# Patient Record
Sex: Female | Born: 1935 | Race: White | Hispanic: No | Smoking: Former smoker
Health system: Southern US, Community
[De-identification: ages and names within clinical notes are randomized; demographics above are authoritative.]

## PROBLEM LIST (undated history)

## (undated) DIAGNOSIS — E785 Hyperlipidemia, unspecified: Secondary | ICD-10-CM

## (undated) DIAGNOSIS — I2510 Atherosclerotic heart disease of native coronary artery without angina pectoris: Secondary | ICD-10-CM

## (undated) DIAGNOSIS — I4891 Unspecified atrial fibrillation: Secondary | ICD-10-CM

## (undated) DIAGNOSIS — I739 Peripheral vascular disease, unspecified: Secondary | ICD-10-CM

## (undated) DIAGNOSIS — F411 Generalized anxiety disorder: Secondary | ICD-10-CM

## (undated) DIAGNOSIS — I10 Essential (primary) hypertension: Secondary | ICD-10-CM

## (undated) DIAGNOSIS — L988 Other specified disorders of the skin and subcutaneous tissue: Secondary | ICD-10-CM

## (undated) DIAGNOSIS — K5732 Diverticulitis of large intestine without perforation or abscess without bleeding: Secondary | ICD-10-CM

## (undated) DIAGNOSIS — I509 Heart failure, unspecified: Secondary | ICD-10-CM

## (undated) DIAGNOSIS — K219 Gastro-esophageal reflux disease without esophagitis: Secondary | ICD-10-CM

## (undated) DIAGNOSIS — G4730 Sleep apnea, unspecified: Secondary | ICD-10-CM

## (undated) DIAGNOSIS — J449 Chronic obstructive pulmonary disease, unspecified: Secondary | ICD-10-CM

## (undated) HISTORY — PX: APPENDECTOMY: SHX54

## (undated) HISTORY — DX: Sleep apnea, unspecified: G47.30

## (undated) HISTORY — DX: Generalized anxiety disorder: F41.1

## (undated) HISTORY — PX: GALLBLADDER SURGERY: SHX652

## (undated) HISTORY — PX: CHOLECYSTECTOMY: SHX55

## (undated) HISTORY — DX: Gastro-esophageal reflux disease without esophagitis: K21.9

## (undated) HISTORY — DX: Hyperlipidemia, unspecified: E78.5

## (undated) HISTORY — PX: ABDOMINAL HYSTERECTOMY: SHX81

## (undated) HISTORY — DX: Peripheral vascular disease, unspecified: I73.9

## (undated) HISTORY — PX: TUBAL LIGATION: SHX77

## (undated) HISTORY — PX: CATARACT EXTRACTION: SUR2

## (undated) HISTORY — DX: Essential (primary) hypertension: I10

## (undated) HISTORY — PX: TEMPORAL ARTERY BIOPSY / LIGATION: SUR132

## (undated) HISTORY — DX: Atherosclerotic heart disease of native coronary artery without angina pectoris: I25.10

## (undated) HISTORY — DX: Diverticulitis of large intestine without perforation or abscess without bleeding: K57.32

## (undated) HISTORY — DX: Unspecified atrial fibrillation: I48.91

## (undated) HISTORY — DX: Other specified disorders of the skin and subcutaneous tissue: L98.8

## (undated) HISTORY — DX: Chronic obstructive pulmonary disease, unspecified: J44.9

## (undated) HISTORY — PX: PARTIAL COLECTOMY: SHX5273

## (undated) HISTORY — PX: OTHER SURGICAL HISTORY: SHX169

## (undated) HISTORY — PX: TONSILLECTOMY: SUR1361

---

## 1997-12-23 ENCOUNTER — Ambulatory Visit: Admission: RE | Admit: 1997-12-23 | Discharge: 1997-12-23 | Payer: Self-pay | Admitting: Internal Medicine

## 1998-12-20 ENCOUNTER — Encounter: Payer: Self-pay | Admitting: General Surgery

## 1998-12-24 ENCOUNTER — Encounter: Payer: Self-pay | Admitting: General Surgery

## 1998-12-24 ENCOUNTER — Ambulatory Visit (HOSPITAL_COMMUNITY): Admission: RE | Admit: 1998-12-24 | Discharge: 1998-12-25 | Payer: Self-pay | Admitting: General Surgery

## 2002-04-07 ENCOUNTER — Encounter: Admission: RE | Admit: 2002-04-07 | Discharge: 2002-07-06 | Payer: Self-pay | Admitting: Internal Medicine

## 2002-12-26 ENCOUNTER — Encounter: Payer: Self-pay | Admitting: Internal Medicine

## 2003-02-06 ENCOUNTER — Encounter: Payer: Self-pay | Admitting: Internal Medicine

## 2003-12-24 ENCOUNTER — Encounter: Admission: RE | Admit: 2003-12-24 | Discharge: 2003-12-24 | Payer: Self-pay | Admitting: Internal Medicine

## 2004-03-04 ENCOUNTER — Ambulatory Visit: Payer: Self-pay | Admitting: Internal Medicine

## 2004-03-17 ENCOUNTER — Ambulatory Visit: Payer: Self-pay | Admitting: Internal Medicine

## 2004-05-12 ENCOUNTER — Ambulatory Visit: Payer: Self-pay | Admitting: Internal Medicine

## 2004-06-13 ENCOUNTER — Ambulatory Visit: Payer: Self-pay | Admitting: Internal Medicine

## 2004-06-25 ENCOUNTER — Ambulatory Visit: Payer: Self-pay | Admitting: Internal Medicine

## 2004-06-26 ENCOUNTER — Ambulatory Visit: Payer: Self-pay | Admitting: Internal Medicine

## 2004-06-27 ENCOUNTER — Ambulatory Visit: Payer: Self-pay | Admitting: Internal Medicine

## 2004-07-14 ENCOUNTER — Ambulatory Visit: Payer: Self-pay | Admitting: Internal Medicine

## 2004-07-30 ENCOUNTER — Ambulatory Visit: Payer: Self-pay | Admitting: Internal Medicine

## 2004-09-04 ENCOUNTER — Ambulatory Visit: Payer: Self-pay | Admitting: Internal Medicine

## 2004-10-01 ENCOUNTER — Ambulatory Visit: Payer: Self-pay | Admitting: Internal Medicine

## 2004-10-22 ENCOUNTER — Ambulatory Visit: Payer: Self-pay | Admitting: Internal Medicine

## 2004-10-27 ENCOUNTER — Ambulatory Visit: Payer: Self-pay

## 2004-10-28 ENCOUNTER — Ambulatory Visit (HOSPITAL_COMMUNITY): Admission: RE | Admit: 2004-10-28 | Discharge: 2004-10-28 | Payer: Self-pay | Admitting: Internal Medicine

## 2004-10-30 ENCOUNTER — Encounter: Admission: RE | Admit: 2004-10-30 | Discharge: 2004-10-30 | Payer: Self-pay | Admitting: Internal Medicine

## 2004-11-03 ENCOUNTER — Ambulatory Visit: Payer: Self-pay | Admitting: Internal Medicine

## 2004-12-26 ENCOUNTER — Ambulatory Visit: Payer: Self-pay | Admitting: Family Medicine

## 2005-01-23 ENCOUNTER — Ambulatory Visit: Payer: Self-pay | Admitting: Internal Medicine

## 2005-03-05 ENCOUNTER — Ambulatory Visit: Payer: Self-pay | Admitting: Internal Medicine

## 2005-03-17 ENCOUNTER — Ambulatory Visit: Payer: Self-pay | Admitting: Internal Medicine

## 2005-04-01 ENCOUNTER — Ambulatory Visit: Payer: Self-pay | Admitting: Internal Medicine

## 2005-04-21 ENCOUNTER — Ambulatory Visit: Payer: Self-pay | Admitting: Internal Medicine

## 2005-05-12 ENCOUNTER — Ambulatory Visit: Payer: Self-pay | Admitting: Internal Medicine

## 2005-06-11 ENCOUNTER — Ambulatory Visit: Payer: Self-pay | Admitting: Internal Medicine

## 2005-07-09 ENCOUNTER — Encounter: Admission: RE | Admit: 2005-07-09 | Discharge: 2005-07-09 | Payer: Self-pay | Admitting: Internal Medicine

## 2005-09-04 ENCOUNTER — Ambulatory Visit: Payer: Self-pay | Admitting: Internal Medicine

## 2005-09-11 ENCOUNTER — Ambulatory Visit: Payer: Self-pay | Admitting: Cardiology

## 2005-09-15 ENCOUNTER — Ambulatory Visit: Payer: Self-pay | Admitting: Internal Medicine

## 2005-09-25 ENCOUNTER — Ambulatory Visit: Payer: Self-pay | Admitting: Internal Medicine

## 2005-10-08 ENCOUNTER — Ambulatory Visit: Payer: Self-pay | Admitting: Internal Medicine

## 2005-10-09 ENCOUNTER — Ambulatory Visit: Payer: Self-pay | Admitting: Cardiology

## 2005-10-14 ENCOUNTER — Ambulatory Visit: Payer: Self-pay | Admitting: Internal Medicine

## 2005-10-20 ENCOUNTER — Ambulatory Visit: Payer: Self-pay | Admitting: Internal Medicine

## 2005-11-02 ENCOUNTER — Ambulatory Visit: Payer: Self-pay | Admitting: Internal Medicine

## 2005-11-11 ENCOUNTER — Ambulatory Visit: Payer: Self-pay | Admitting: Internal Medicine

## 2005-12-10 ENCOUNTER — Ambulatory Visit: Payer: Self-pay | Admitting: Internal Medicine

## 2006-02-09 ENCOUNTER — Ambulatory Visit: Payer: Self-pay | Admitting: Internal Medicine

## 2006-02-09 LAB — CONVERTED CEMR LAB
BUN: 7 mg/dL (ref 6–23)
CO2: 31 meq/L (ref 19–32)
Calcium: 10.5 mg/dL (ref 8.4–10.5)
Chloride: 95 meq/L — ABNORMAL LOW (ref 96–112)
Creatinine, Ser: 0.8 mg/dL (ref 0.4–1.2)
GFR calc non Af Amer: 74 mL/min
Glomerular Filtration Rate, Af Am: 90 mL/min/{1.73_m2}
Glucose, Bld: 117 mg/dL — ABNORMAL HIGH (ref 70–99)
Potassium: 3.5 meq/L (ref 3.5–5.1)
Sodium: 137 meq/L (ref 135–145)

## 2006-03-26 ENCOUNTER — Ambulatory Visit: Payer: Self-pay | Admitting: Internal Medicine

## 2006-05-10 ENCOUNTER — Ambulatory Visit: Payer: Self-pay | Admitting: Internal Medicine

## 2006-05-19 ENCOUNTER — Ambulatory Visit: Payer: Self-pay | Admitting: Internal Medicine

## 2006-06-01 ENCOUNTER — Encounter (INDEPENDENT_AMBULATORY_CARE_PROVIDER_SITE_OTHER): Payer: Self-pay | Admitting: *Deleted

## 2006-06-01 ENCOUNTER — Ambulatory Visit (HOSPITAL_COMMUNITY): Admission: RE | Admit: 2006-06-01 | Discharge: 2006-06-01 | Payer: Self-pay | Admitting: General Surgery

## 2006-06-11 ENCOUNTER — Ambulatory Visit: Payer: Self-pay | Admitting: Internal Medicine

## 2006-07-14 ENCOUNTER — Ambulatory Visit: Payer: Self-pay | Admitting: Internal Medicine

## 2006-08-05 ENCOUNTER — Encounter: Admission: RE | Admit: 2006-08-05 | Discharge: 2006-08-05 | Payer: Self-pay | Admitting: Internal Medicine

## 2006-08-25 ENCOUNTER — Ambulatory Visit: Payer: Self-pay | Admitting: Internal Medicine

## 2006-09-14 ENCOUNTER — Ambulatory Visit: Payer: Self-pay | Admitting: Internal Medicine

## 2006-10-12 ENCOUNTER — Ambulatory Visit: Payer: Self-pay | Admitting: Internal Medicine

## 2006-10-12 LAB — CONVERTED CEMR LAB
ALT: 19 units/L (ref 0–35)
AST: 22 units/L (ref 0–37)
Albumin: 4.1 g/dL (ref 3.5–5.2)
Alkaline Phosphatase: 44 units/L (ref 39–117)
BUN: 10 mg/dL (ref 6–23)
Bilirubin, Direct: 0.1 mg/dL (ref 0.0–0.3)
CO2: 29 meq/L (ref 19–32)
Calcium: 9.7 mg/dL (ref 8.4–10.5)
Chloride: 94 meq/L — ABNORMAL LOW (ref 96–112)
Cholesterol: 203 mg/dL (ref 0–200)
Creatinine, Ser: 0.8 mg/dL (ref 0.4–1.2)
Direct LDL: 128.7 mg/dL
GFR calc Af Amer: 90 mL/min
GFR calc non Af Amer: 74 mL/min
Glucose, Bld: 155 mg/dL — ABNORMAL HIGH (ref 70–99)
HDL: 39.8 mg/dL (ref >39.0)
Potassium: 4 meq/L (ref 3.5–5.1)
Sodium: 133 meq/L — ABNORMAL LOW (ref 135–145)
Total Bilirubin: 0.7 mg/dL (ref 0.3–1.2)
Total CHOL/HDL Ratio: 5.1
Total Protein: 7.1 g/dL (ref 6.0–8.3)
Triglycerides: 265 mg/dL (ref 0–149)
VLDL: 53 mg/dL — ABNORMAL HIGH (ref 0–40)

## 2006-10-29 ENCOUNTER — Telehealth (INDEPENDENT_AMBULATORY_CARE_PROVIDER_SITE_OTHER): Payer: Self-pay | Admitting: *Deleted

## 2006-11-01 ENCOUNTER — Telehealth (INDEPENDENT_AMBULATORY_CARE_PROVIDER_SITE_OTHER): Payer: Self-pay | Admitting: *Deleted

## 2006-11-01 ENCOUNTER — Telehealth: Payer: Self-pay | Admitting: Internal Medicine

## 2006-12-20 ENCOUNTER — Telehealth: Payer: Self-pay | Admitting: Internal Medicine

## 2007-01-06 DIAGNOSIS — I10 Essential (primary) hypertension: Secondary | ICD-10-CM | POA: Insufficient documentation

## 2007-01-06 DIAGNOSIS — E785 Hyperlipidemia, unspecified: Secondary | ICD-10-CM | POA: Insufficient documentation

## 2007-01-06 DIAGNOSIS — K219 Gastro-esophageal reflux disease without esophagitis: Secondary | ICD-10-CM

## 2007-01-06 DIAGNOSIS — J449 Chronic obstructive pulmonary disease, unspecified: Secondary | ICD-10-CM

## 2007-01-06 DIAGNOSIS — J4489 Other specified chronic obstructive pulmonary disease: Secondary | ICD-10-CM | POA: Insufficient documentation

## 2007-01-06 HISTORY — DX: Hyperlipidemia, unspecified: E78.5

## 2007-01-06 HISTORY — DX: Other specified chronic obstructive pulmonary disease: J44.89

## 2007-01-06 HISTORY — DX: Essential (primary) hypertension: I10

## 2007-01-06 HISTORY — DX: Chronic obstructive pulmonary disease, unspecified: J44.9

## 2007-01-06 HISTORY — DX: Gastro-esophageal reflux disease without esophagitis: K21.9

## 2007-01-12 ENCOUNTER — Ambulatory Visit: Payer: Self-pay | Admitting: Internal Medicine

## 2007-01-12 DIAGNOSIS — M353 Polymyalgia rheumatica: Secondary | ICD-10-CM | POA: Insufficient documentation

## 2007-01-12 LAB — CONVERTED CEMR LAB
Cholesterol, target level: 200 mg/dL
HDL goal, serum: 40 mg/dL
LDL Goal: 100 mg/dL

## 2007-01-13 LAB — CONVERTED CEMR LAB
ALT: 18 units/L (ref 0–35)
AST: 22 units/L (ref 0–37)
Albumin: 3.9 g/dL (ref 3.5–5.2)
Alkaline Phosphatase: 49 units/L (ref 39–117)
BUN: 6 mg/dL (ref 6–23)
Bilirubin, Direct: 0.1 mg/dL (ref 0.0–0.3)
CO2: 29 meq/L (ref 19–32)
Calcium: 9.5 mg/dL (ref 8.4–10.5)
Chloride: 99 meq/L (ref 96–112)
Cholesterol: 209 mg/dL (ref 0–200)
Creatinine, Ser: 0.8 mg/dL (ref 0.4–1.2)
Direct LDL: 128.3 mg/dL
GFR calc Af Amer: 90 mL/min
GFR calc non Af Amer: 74 mL/min
Glucose, Bld: 201 mg/dL — ABNORMAL HIGH (ref 70–99)
HDL: 24.9 mg/dL — ABNORMAL LOW (ref >39.0)
Hgb A1c MFr Bld: 6.8 % — ABNORMAL HIGH (ref 4.6–6.0)
Potassium: 3.7 meq/L (ref 3.5–5.1)
Sodium: 137 meq/L (ref 135–145)
Total Bilirubin: 0.6 mg/dL (ref 0.3–1.2)
Total CHOL/HDL Ratio: 8.4
Total Protein: 6.6 g/dL (ref 6.0–8.3)
Triglycerides: 348 mg/dL (ref 0–149)
VLDL: 70 mg/dL — ABNORMAL HIGH (ref 0–40)

## 2007-01-17 ENCOUNTER — Ambulatory Visit: Payer: Self-pay | Admitting: Internal Medicine

## 2007-01-17 DIAGNOSIS — E119 Type 2 diabetes mellitus without complications: Secondary | ICD-10-CM

## 2007-01-26 ENCOUNTER — Telehealth: Payer: Self-pay | Admitting: Internal Medicine

## 2007-02-08 ENCOUNTER — Telehealth: Payer: Self-pay | Admitting: Internal Medicine

## 2007-03-04 ENCOUNTER — Telehealth: Payer: Self-pay | Admitting: Internal Medicine

## 2007-03-10 ENCOUNTER — Ambulatory Visit: Payer: Self-pay | Admitting: Internal Medicine

## 2007-03-10 LAB — CONVERTED CEMR LAB
BUN: 8 mg/dL (ref 6–23)
CO2: 30 meq/L (ref 19–32)
Calcium: 9.9 mg/dL (ref 8.4–10.5)
Chloride: 95 meq/L — ABNORMAL LOW (ref 96–112)
Creatinine, Ser: 0.7 mg/dL (ref 0.4–1.2)
GFR calc Af Amer: 105 mL/min
GFR calc non Af Amer: 86 mL/min
Glucose, Bld: 89 mg/dL (ref 70–99)
Potassium: 4 meq/L (ref 3.5–5.1)
Sodium: 135 meq/L (ref 135–145)

## 2007-04-25 ENCOUNTER — Telehealth (INDEPENDENT_AMBULATORY_CARE_PROVIDER_SITE_OTHER): Payer: Self-pay | Admitting: *Deleted

## 2007-05-02 ENCOUNTER — Telehealth: Payer: Self-pay | Admitting: Internal Medicine

## 2007-06-08 ENCOUNTER — Ambulatory Visit: Payer: Self-pay | Admitting: Internal Medicine

## 2007-06-09 LAB — CONVERTED CEMR LAB
BUN: 7 mg/dL (ref 6–23)
CO2: 28 meq/L (ref 19–32)
Calcium: 10.2 mg/dL (ref 8.4–10.5)
Chloride: 95 meq/L — ABNORMAL LOW (ref 96–112)
Cholesterol: 203 mg/dL (ref 0–200)
Creatinine, Ser: 1 mg/dL (ref 0.4–1.2)
Direct LDL: 134.3 mg/dL
GFR calc Af Amer: 69 mL/min
GFR calc non Af Amer: 57 mL/min
Glucose, Bld: 111 mg/dL — ABNORMAL HIGH (ref 70–99)
HDL: 28 mg/dL — ABNORMAL LOW (ref >39.0)
Hgb A1c MFr Bld: 6.6 % — ABNORMAL HIGH (ref 4.6–6.0)
Potassium: 4 meq/L (ref 3.5–5.1)
Sodium: 134 meq/L — ABNORMAL LOW (ref 135–145)
Total CHOL/HDL Ratio: 7.3
Triglycerides: 232 mg/dL (ref 0–149)
VLDL: 46 mg/dL — ABNORMAL HIGH (ref 0–40)

## 2007-06-14 ENCOUNTER — Emergency Department (HOSPITAL_COMMUNITY): Admission: EM | Admit: 2007-06-14 | Discharge: 2007-06-15 | Payer: Self-pay | Admitting: Emergency Medicine

## 2007-06-14 ENCOUNTER — Telehealth: Payer: Self-pay | Admitting: Internal Medicine

## 2007-06-27 ENCOUNTER — Ambulatory Visit: Payer: Self-pay | Admitting: Internal Medicine

## 2007-06-28 ENCOUNTER — Telehealth: Payer: Self-pay | Admitting: Internal Medicine

## 2007-06-28 LAB — CONVERTED CEMR LAB
ALT: 16 units/L (ref 0–35)
Bilirubin, Direct: 0.1 mg/dL (ref 0.0–0.3)
Total Protein: 6.8 g/dL (ref 6.0–8.3)

## 2007-06-29 ENCOUNTER — Ambulatory Visit: Payer: Self-pay | Admitting: Cardiology

## 2007-06-30 ENCOUNTER — Encounter: Payer: Self-pay | Admitting: Internal Medicine

## 2007-06-30 ENCOUNTER — Ambulatory Visit: Payer: Self-pay

## 2007-07-07 ENCOUNTER — Telehealth: Payer: Self-pay | Admitting: Internal Medicine

## 2007-09-08 ENCOUNTER — Encounter: Admission: RE | Admit: 2007-09-08 | Discharge: 2007-09-08 | Payer: Self-pay | Admitting: Internal Medicine

## 2007-09-22 ENCOUNTER — Telehealth: Payer: Self-pay | Admitting: Internal Medicine

## 2007-09-23 ENCOUNTER — Telehealth: Payer: Self-pay | Admitting: Internal Medicine

## 2007-09-23 ENCOUNTER — Encounter: Payer: Self-pay | Admitting: Internal Medicine

## 2007-09-23 ENCOUNTER — Ambulatory Visit: Payer: Self-pay | Admitting: Internal Medicine

## 2007-09-30 ENCOUNTER — Ambulatory Visit: Payer: Self-pay | Admitting: Internal Medicine

## 2007-09-30 LAB — CONVERTED CEMR LAB
BUN: 11 mg/dL (ref 6–23)
Calcium: 9.8 mg/dL (ref 8.4–10.5)
Chloride: 96 meq/L (ref 96–112)
Cholesterol: 175 mg/dL (ref 0–200)
Direct LDL: 91.2 mg/dL
GFR calc non Af Amer: 74 mL/min
Hgb A1c MFr Bld: 6.4 % — ABNORMAL HIGH (ref 4.6–6.0)
Potassium: 4.3 meq/L (ref 3.5–5.1)
Sodium: 133 meq/L — ABNORMAL LOW (ref 135–145)

## 2007-10-17 ENCOUNTER — Telehealth: Payer: Self-pay | Admitting: Internal Medicine

## 2007-12-20 ENCOUNTER — Telehealth: Payer: Self-pay | Admitting: Internal Medicine

## 2008-01-02 ENCOUNTER — Telehealth: Payer: Self-pay | Admitting: Internal Medicine

## 2008-01-04 DIAGNOSIS — I739 Peripheral vascular disease, unspecified: Secondary | ICD-10-CM

## 2008-01-04 DIAGNOSIS — K449 Diaphragmatic hernia without obstruction or gangrene: Secondary | ICD-10-CM | POA: Insufficient documentation

## 2008-01-04 DIAGNOSIS — I2510 Atherosclerotic heart disease of native coronary artery without angina pectoris: Secondary | ICD-10-CM

## 2008-01-04 DIAGNOSIS — Z86718 Personal history of other venous thrombosis and embolism: Secondary | ICD-10-CM

## 2008-01-05 ENCOUNTER — Ambulatory Visit: Payer: Self-pay | Admitting: Internal Medicine

## 2008-01-12 ENCOUNTER — Ambulatory Visit: Payer: Self-pay | Admitting: Internal Medicine

## 2008-01-13 ENCOUNTER — Encounter: Payer: Self-pay | Admitting: Internal Medicine

## 2008-01-13 ENCOUNTER — Ambulatory Visit: Payer: Self-pay | Admitting: Internal Medicine

## 2008-01-16 ENCOUNTER — Encounter: Payer: Self-pay | Admitting: Internal Medicine

## 2008-01-16 LAB — CONVERTED CEMR LAB: UREASE: NEGATIVE

## 2008-01-23 ENCOUNTER — Telehealth: Payer: Self-pay | Admitting: *Deleted

## 2008-01-26 ENCOUNTER — Ambulatory Visit: Payer: Self-pay | Admitting: Internal Medicine

## 2008-02-03 ENCOUNTER — Telehealth: Payer: Self-pay | Admitting: Internal Medicine

## 2008-02-05 LAB — HM COLONOSCOPY

## 2008-02-06 LAB — CONVERTED CEMR LAB
ALT: 14 units/L (ref 0–35)
AST: 24 units/L (ref 0–37)
CO2: 29 meq/L (ref 19–32)
Cholesterol: 188 mg/dL (ref 0–200)
Direct LDL: 95.5 mg/dL
GFR calc Af Amer: 69 mL/min
Hgb A1c MFr Bld: 6.1 % — ABNORMAL HIGH (ref 4.6–6.0)
Sodium: 135 meq/L (ref 135–145)
Total CHOL/HDL Ratio: 7
Triglycerides: 336 mg/dL (ref 0–149)

## 2008-02-22 ENCOUNTER — Inpatient Hospital Stay: Payer: Self-pay | Admitting: Specialist

## 2008-03-09 ENCOUNTER — Ambulatory Visit: Payer: Self-pay | Admitting: Internal Medicine

## 2008-03-13 ENCOUNTER — Emergency Department: Payer: Self-pay | Admitting: Emergency Medicine

## 2008-03-16 ENCOUNTER — Ambulatory Visit: Payer: Self-pay | Admitting: Internal Medicine

## 2008-03-19 ENCOUNTER — Ambulatory Visit: Payer: Self-pay | Admitting: Internal Medicine

## 2008-04-09 ENCOUNTER — Telehealth: Payer: Self-pay | Admitting: Internal Medicine

## 2008-05-18 ENCOUNTER — Ambulatory Visit: Payer: Self-pay | Admitting: Internal Medicine

## 2008-05-18 LAB — CONVERTED CEMR LAB
Alkaline Phosphatase: 57 units/L (ref 39–117)
Bilirubin, Direct: 0.1 mg/dL (ref 0.0–0.3)
Cholesterol: 193 mg/dL (ref 0–200)
Direct LDL: 111.9 mg/dL
HDL: 30.2 mg/dL — ABNORMAL LOW (ref >39.0)
Hgb A1c MFr Bld: 6.3 % — ABNORMAL HIGH (ref 4.6–6.0)
Potassium: 4.8 meq/L (ref 3.5–5.1)
Total Bilirubin: 0.5 mg/dL (ref 0.3–1.2)
Triglycerides: 258 mg/dL (ref 0–149)
VLDL: 52 mg/dL — ABNORMAL HIGH (ref 0–40)

## 2008-05-23 ENCOUNTER — Ambulatory Visit: Payer: Self-pay | Admitting: Internal Medicine

## 2008-05-30 ENCOUNTER — Ambulatory Visit: Payer: Self-pay | Admitting: Internal Medicine

## 2008-06-01 ENCOUNTER — Telehealth: Payer: Self-pay | Admitting: Internal Medicine

## 2008-07-11 ENCOUNTER — Ambulatory Visit: Payer: Self-pay | Admitting: Internal Medicine

## 2008-08-30 ENCOUNTER — Telehealth: Payer: Self-pay | Admitting: Internal Medicine

## 2008-10-02 ENCOUNTER — Ambulatory Visit: Payer: Self-pay | Admitting: Internal Medicine

## 2008-10-03 ENCOUNTER — Telehealth: Payer: Self-pay | Admitting: Internal Medicine

## 2008-11-02 ENCOUNTER — Ambulatory Visit: Payer: Self-pay | Admitting: Internal Medicine

## 2008-11-13 ENCOUNTER — Telehealth: Payer: Self-pay | Admitting: Internal Medicine

## 2008-11-21 ENCOUNTER — Ambulatory Visit: Payer: Self-pay | Admitting: Internal Medicine

## 2008-11-21 DIAGNOSIS — F411 Generalized anxiety disorder: Secondary | ICD-10-CM | POA: Insufficient documentation

## 2008-11-21 DIAGNOSIS — G4730 Sleep apnea, unspecified: Secondary | ICD-10-CM | POA: Insufficient documentation

## 2008-11-21 HISTORY — DX: Generalized anxiety disorder: F41.1

## 2008-11-21 HISTORY — DX: Sleep apnea, unspecified: G47.30

## 2008-12-05 ENCOUNTER — Encounter: Payer: Self-pay | Admitting: Internal Medicine

## 2008-12-13 ENCOUNTER — Encounter: Payer: Self-pay | Admitting: Internal Medicine

## 2008-12-28 ENCOUNTER — Ambulatory Visit: Payer: Self-pay | Admitting: Internal Medicine

## 2008-12-31 ENCOUNTER — Telehealth: Payer: Self-pay | Admitting: Internal Medicine

## 2009-01-02 ENCOUNTER — Encounter: Payer: Self-pay | Admitting: Internal Medicine

## 2009-01-02 ENCOUNTER — Ambulatory Visit (HOSPITAL_BASED_OUTPATIENT_CLINIC_OR_DEPARTMENT_OTHER): Admission: RE | Admit: 2009-01-02 | Discharge: 2009-01-02 | Payer: Self-pay | Admitting: Internal Medicine

## 2009-01-03 LAB — CONVERTED CEMR LAB
ALT: 14 units/L (ref 0–35)
AST: 20 units/L (ref 0–37)
Albumin: 4.2 g/dL (ref 3.5–5.2)
BUN: 11 mg/dL (ref 6–23)
Calcium: 9.1 mg/dL (ref 8.4–10.5)
GFR calc non Af Amer: 64.37 mL/min (ref >60)
Glucose, Bld: 125 mg/dL — ABNORMAL HIGH (ref 70–99)
Potassium: 3.5 meq/L (ref 3.5–5.1)
Sodium: 130 meq/L — ABNORMAL LOW (ref 135–145)
Total Protein: 7.1 g/dL (ref 6.0–8.3)
Triglycerides: 259 mg/dL — ABNORMAL HIGH (ref 0.0–149.0)
VLDL: 51.8 mg/dL — ABNORMAL HIGH (ref 0.0–40.0)

## 2009-01-04 ENCOUNTER — Encounter: Payer: Self-pay | Admitting: Internal Medicine

## 2009-01-04 ENCOUNTER — Telehealth: Payer: Self-pay | Admitting: *Deleted

## 2009-01-08 ENCOUNTER — Ambulatory Visit: Payer: Self-pay | Admitting: Pulmonary Disease

## 2009-01-21 ENCOUNTER — Encounter: Payer: Self-pay | Admitting: Internal Medicine

## 2009-01-22 ENCOUNTER — Telehealth: Payer: Self-pay | Admitting: Internal Medicine

## 2009-02-04 ENCOUNTER — Encounter: Payer: Self-pay | Admitting: Internal Medicine

## 2009-03-30 ENCOUNTER — Inpatient Hospital Stay (HOSPITAL_COMMUNITY): Admission: EM | Admit: 2009-03-30 | Discharge: 2009-04-04 | Payer: Self-pay | Admitting: Emergency Medicine

## 2009-03-30 ENCOUNTER — Ambulatory Visit: Payer: Self-pay | Admitting: Cardiovascular Disease

## 2009-04-02 ENCOUNTER — Encounter (INDEPENDENT_AMBULATORY_CARE_PROVIDER_SITE_OTHER): Payer: Self-pay | Admitting: Internal Medicine

## 2009-04-10 ENCOUNTER — Telehealth: Payer: Self-pay | Admitting: Family Medicine

## 2009-04-10 ENCOUNTER — Ambulatory Visit: Payer: Self-pay | Admitting: Family Medicine

## 2009-04-10 LAB — CONVERTED CEMR LAB
Bilirubin Urine: NEGATIVE
Nitrite: NEGATIVE
Specific Gravity, Urine: 1.01
Urobilinogen, UA: 0.2

## 2009-04-11 ENCOUNTER — Encounter: Payer: Self-pay | Admitting: Internal Medicine

## 2009-04-15 ENCOUNTER — Telehealth: Payer: Self-pay | Admitting: Internal Medicine

## 2009-04-19 ENCOUNTER — Ambulatory Visit: Payer: Self-pay | Admitting: Internal Medicine

## 2009-04-19 LAB — CONVERTED CEMR LAB
Basophils Absolute: 0.1 10*3/uL (ref 0.0–0.1)
HCT: 31.8 % — ABNORMAL LOW (ref 36.0–46.0)
Hemoglobin: 10.3 g/dL — ABNORMAL LOW (ref 12.0–15.0)
Lymphocytes Relative: 29.9 % (ref 12.0–46.0)
Lymphs Abs: 1.5 10*3/uL (ref 0.7–4.0)
MCHC: 32.5 g/dL (ref 30.0–36.0)
Monocytes Absolute: 0.5 10*3/uL (ref 0.1–1.0)
Monocytes Relative: 9 % (ref 3.0–12.0)
RDW: 12.8 % (ref 11.5–14.6)
WBC: 5.1 10*3/uL (ref 4.5–10.5)

## 2009-04-22 ENCOUNTER — Telehealth: Payer: Self-pay | Admitting: Internal Medicine

## 2009-04-22 LAB — CONVERTED CEMR LAB
AST: 17 units/L (ref 0–37)
Alkaline Phosphatase: 38 units/L — ABNORMAL LOW (ref 39–117)
Bilirubin, Direct: 0.1 mg/dL (ref 0.0–0.3)
CO2: 19 meq/L (ref 19–32)
Chloride: 92 meq/L — ABNORMAL LOW (ref 96–112)
Glucose, Bld: 117 mg/dL — ABNORMAL HIGH (ref 70–99)
Indirect Bilirubin: 0.2 mg/dL (ref 0.0–0.9)
Potassium: 5.7 meq/L — ABNORMAL HIGH (ref 3.5–5.3)
Total Bilirubin: 0.3 mg/dL (ref 0.3–1.2)

## 2009-04-23 ENCOUNTER — Ambulatory Visit: Payer: Self-pay | Admitting: Gastroenterology

## 2009-04-23 ENCOUNTER — Ambulatory Visit: Payer: Self-pay | Admitting: Internal Medicine

## 2009-04-23 LAB — CONVERTED CEMR LAB
Basophils Absolute: 0.1 10*3/uL (ref 0.0–0.1)
Basophils Relative: 1.3 % (ref 0.0–3.0)
Bilirubin Urine: NEGATIVE
Eosinophils Relative: 5.1 % — ABNORMAL HIGH (ref 0.0–5.0)
MCV: 87 fL (ref 78.0–100.0)
Monocytes Absolute: 0.7 10*3/uL (ref 0.1–1.0)
Monocytes Relative: 10.6 % (ref 3.0–12.0)
Neutro Abs: 2.9 10*3/uL (ref 1.4–7.7)
Neutrophils Relative %: 45.2 % (ref 43.0–77.0)
Platelets: 265 10*3/uL (ref 150.0–400.0)
RDW: 12.8 % (ref 11.5–14.6)
Specific Gravity, Urine: 1.025 (ref 1.000–1.030)
Urine Glucose: NEGATIVE mg/dL
Urobilinogen, UA: 0.2 (ref 0.0–1.0)
WBC: 6.4 10*3/uL (ref 4.5–10.5)
pH: 5 (ref 5.0–8.0)

## 2009-04-24 ENCOUNTER — Ambulatory Visit: Payer: Self-pay | Admitting: Cardiovascular Disease

## 2009-04-24 LAB — CONVERTED CEMR LAB
CO2: 24 meq/L (ref 19–32)
Chloride: 96 meq/L (ref 96–112)
Potassium: 5.5 meq/L — ABNORMAL HIGH (ref 3.5–5.1)
Sodium: 125 meq/L — ABNORMAL LOW (ref 135–145)

## 2009-04-26 ENCOUNTER — Ambulatory Visit: Payer: Self-pay | Admitting: Internal Medicine

## 2009-04-29 LAB — CONVERTED CEMR LAB
BUN: 10 mg/dL
CO2: 22 meq/L
Calcium: 9.6 mg/dL
Chloride: 95 meq/L — ABNORMAL LOW
Creatinine, Ser: 0.85 mg/dL
Glucose, Bld: 104 mg/dL — ABNORMAL HIGH
Potassium: 5.8 meq/L — ABNORMAL HIGH
Sodium: 129 meq/L — ABNORMAL LOW

## 2009-04-30 ENCOUNTER — Encounter: Payer: Self-pay | Admitting: Internal Medicine

## 2009-04-30 ENCOUNTER — Telehealth: Payer: Self-pay | Admitting: Internal Medicine

## 2009-05-01 ENCOUNTER — Ambulatory Visit: Payer: Self-pay | Admitting: Internal Medicine

## 2009-05-02 ENCOUNTER — Encounter: Payer: Self-pay | Admitting: Gastroenterology

## 2009-05-02 ENCOUNTER — Encounter: Payer: Self-pay | Admitting: Internal Medicine

## 2009-05-02 LAB — CONVERTED CEMR LAB
BUN: 4 mg/dL — ABNORMAL LOW (ref 6–23)
Chloride: 98 meq/L (ref 96–112)
Creatinine, Ser: 0.8 mg/dL (ref 0.4–1.2)
GFR calc non Af Amer: 73.67 mL/min (ref >60)

## 2009-05-03 ENCOUNTER — Telehealth: Payer: Self-pay | Admitting: Internal Medicine

## 2009-05-06 ENCOUNTER — Encounter: Admission: RE | Admit: 2009-05-06 | Discharge: 2009-05-06 | Payer: Self-pay | Admitting: General Surgery

## 2009-05-07 ENCOUNTER — Ambulatory Visit: Payer: Self-pay | Admitting: Internal Medicine

## 2009-05-08 ENCOUNTER — Telehealth (INDEPENDENT_AMBULATORY_CARE_PROVIDER_SITE_OTHER): Payer: Self-pay

## 2009-05-09 ENCOUNTER — Encounter (HOSPITAL_COMMUNITY): Admission: RE | Admit: 2009-05-09 | Discharge: 2009-07-11 | Payer: Self-pay | Admitting: Internal Medicine

## 2009-05-09 ENCOUNTER — Ambulatory Visit: Payer: Self-pay | Admitting: Internal Medicine

## 2009-05-09 ENCOUNTER — Ambulatory Visit: Payer: Self-pay

## 2009-05-10 ENCOUNTER — Telehealth (INDEPENDENT_AMBULATORY_CARE_PROVIDER_SITE_OTHER): Payer: Self-pay | Admitting: *Deleted

## 2009-05-14 ENCOUNTER — Encounter (INDEPENDENT_AMBULATORY_CARE_PROVIDER_SITE_OTHER): Payer: Self-pay | Admitting: General Surgery

## 2009-05-14 ENCOUNTER — Inpatient Hospital Stay (HOSPITAL_COMMUNITY): Admission: RE | Admit: 2009-05-14 | Discharge: 2009-05-20 | Payer: Self-pay | Admitting: General Surgery

## 2009-05-21 ENCOUNTER — Telehealth: Payer: Self-pay | Admitting: Internal Medicine

## 2009-05-30 ENCOUNTER — Encounter: Payer: Self-pay | Admitting: Internal Medicine

## 2009-05-31 ENCOUNTER — Telehealth: Payer: Self-pay | Admitting: Internal Medicine

## 2009-06-03 ENCOUNTER — Encounter: Payer: Self-pay | Admitting: Internal Medicine

## 2009-06-04 ENCOUNTER — Encounter: Payer: Self-pay | Admitting: Internal Medicine

## 2009-06-12 ENCOUNTER — Encounter: Payer: Self-pay | Admitting: Internal Medicine

## 2009-06-14 ENCOUNTER — Encounter: Payer: Self-pay | Admitting: Internal Medicine

## 2009-06-21 ENCOUNTER — Telehealth: Payer: Self-pay | Admitting: Internal Medicine

## 2009-07-17 ENCOUNTER — Ambulatory Visit: Payer: Self-pay | Admitting: Family Medicine

## 2009-07-17 DIAGNOSIS — H8110 Benign paroxysmal vertigo, unspecified ear: Secondary | ICD-10-CM

## 2009-07-23 ENCOUNTER — Ambulatory Visit: Payer: Self-pay | Admitting: Internal Medicine

## 2009-07-23 DIAGNOSIS — K5732 Diverticulitis of large intestine without perforation or abscess without bleeding: Secondary | ICD-10-CM

## 2009-07-23 DIAGNOSIS — F17200 Nicotine dependence, unspecified, uncomplicated: Secondary | ICD-10-CM

## 2009-07-23 HISTORY — DX: Diverticulitis of large intestine without perforation or abscess without bleeding: K57.32

## 2009-07-25 ENCOUNTER — Encounter: Payer: Self-pay | Admitting: Internal Medicine

## 2009-09-20 ENCOUNTER — Telehealth: Payer: Self-pay | Admitting: Internal Medicine

## 2010-01-04 LAB — HM DIABETES EYE EXAM: HM Diabetic Eye Exam: NORMAL

## 2010-01-09 ENCOUNTER — Ambulatory Visit: Payer: Self-pay | Admitting: Internal Medicine

## 2010-01-09 DIAGNOSIS — R269 Unspecified abnormalities of gait and mobility: Secondary | ICD-10-CM

## 2010-01-13 LAB — CONVERTED CEMR LAB
ALT: 14 units/L (ref 0–35)
AST: 18 units/L (ref 0–37)
Albumin: 4 g/dL (ref 3.5–5.2)
Basophils Absolute: 0.1 10*3/uL (ref 0.0–0.1)
Calcium: 9.6 mg/dL (ref 8.4–10.5)
Cholesterol: 189 mg/dL (ref 0–200)
Creatinine, Ser: 0.8 mg/dL (ref 0.4–1.2)
Direct LDL: 98.9 mg/dL
GFR calc non Af Amer: 71.47 mL/min (ref >60)
Glucose, Bld: 112 mg/dL — ABNORMAL HIGH (ref 70–99)
HDL: 31.9 mg/dL — ABNORMAL LOW (ref >39.00)
Hemoglobin: 13.8 g/dL (ref 12.0–15.0)
Hgb A1c MFr Bld: 6.2 % (ref 4.6–6.5)
Lymphocytes Relative: 31.3 % (ref 12.0–46.0)
Monocytes Relative: 8.6 % (ref 3.0–12.0)
Neutro Abs: 4.4 10*3/uL (ref 1.4–7.7)
Neutrophils Relative %: 53.7 % (ref 43.0–77.0)
RDW: 13.1 % (ref 11.5–14.6)
Sodium: 139 meq/L (ref 135–145)
Total Bilirubin: 0.4 mg/dL (ref 0.3–1.2)
Total Protein: 7 g/dL (ref 6.0–8.3)
Triglycerides: 349 mg/dL — ABNORMAL HIGH (ref 0.0–149.0)
VLDL: 69.8 mg/dL — ABNORMAL HIGH (ref 0.0–40.0)

## 2010-01-20 ENCOUNTER — Encounter: Admission: RE | Admit: 2010-01-20 | Discharge: 2010-01-20 | Payer: Self-pay | Admitting: Internal Medicine

## 2010-01-20 LAB — HM MAMMOGRAPHY

## 2010-01-24 ENCOUNTER — Telehealth: Payer: Self-pay | Admitting: Internal Medicine

## 2010-01-29 ENCOUNTER — Encounter: Payer: Self-pay | Admitting: Internal Medicine

## 2010-02-04 ENCOUNTER — Encounter: Payer: Self-pay | Admitting: Internal Medicine

## 2010-02-13 ENCOUNTER — Telehealth: Payer: Self-pay | Admitting: Internal Medicine

## 2010-02-20 ENCOUNTER — Encounter: Payer: Self-pay | Admitting: Internal Medicine

## 2010-03-04 ENCOUNTER — Telehealth: Payer: Self-pay | Admitting: Internal Medicine

## 2010-03-06 ENCOUNTER — Encounter: Payer: Self-pay | Admitting: Internal Medicine

## 2010-05-06 NOTE — Progress Notes (Signed)
Summary: Nuc. Pre-Procedure  Phone Note Outgoing Call Call back at [facility] Phone [phone]   Call placed by: Irean Hong, RN,  May 08, 2009 2:00 PM Summary of Call: Reviewed information on [employer] Information Sheet (see scanned document for further details).  Spoke with patient's daughter.     Nuclear Med Background Indications for Stress Test: Evaluation for Ischemia, Surgical Clearance  Indications Comments: Pending: Recto-vaginal fistula with general anesthesia by Dr. Claud Kelp  History: COPD, CT/MRI, Echo  History Comments: 12/10 Echo:EF=55-65%.     Nuclear Pre-Procedure Cardiac Risk Factors: Hypertension, Lipids, NIDDM, PVD, Smoker Height (in): 64

## 2010-05-06 NOTE — Assessment & Plan Note (Signed)
Summary: 4 MONTH ROV/NJR/pt rescd from bump//ccm/pt rsc from bmp/cjr   Vital Signs:  Patient profile:   77 year old female Weight:      151 pounds Temp:     98.3 degrees F BP sitting:   124 / 60  Vitals Entered By: Gladis Riffle, RN (April 19, 2009 2:38 PM)   Primary Care Provider:  Birdie Sons MD   History of Present Illness: recent diverticulitis---see echart---CT pelvic  pt. here with dtr and son she is feeling some better and denies abdominal pain, fever, chills. she has noted some feculent matter from vafinal introitus. no uti signs she denies back pain there is some question as to whether she is to havensuregery review of hospital records...needs to followup with gi and surgery she admits to weakness...improving since hospitalization  All other systems reviewed and were negative     Preventive Screening-Counseling & Management  Alcohol-Tobacco     Smoking Status: current     Packs/Day: 0.5  Current Medications (verified): 1)  Alprazolam 0.5 Mg Tabs (Alprazolam) .... Take 1 Tablet By Mouth Once A Day-- Each Rx To Last 30 Days 2)  Aspir-81 81 Mg Tbec (Aspirin) .... Take 1 Tablet By Mouth Once A Day 3)  Zegerid 40-1100 Mg Caps (Omeprazole-Sodium Bicarbonate) .... Take 1 Capsule By Mouth Once A Day 4)  Hydrocodone-Acetaminophen 7.5-650 Mg Tabs (Hydrocodone-Acetaminophen) .... Take 1 Tablet By Mouth Three Times A Day As Needed Pain 5)  Prednisone 5 Mg Tabs (Prednisone) .... Take 3 By Mouth Once Daily or As Directed 6)  Voltaren 1 % Gel (Diclofenac Sodium) .... Apply Two Times A Day To Shoulder As Needed Pain 7)  Fluticasone Propionate 50 Mcg/act  Susp (Fluticasone Propionate) .... 2 Sprays Each Nostril Once Daily 8)  Ferrous Sulfate 325 (65 Fe) Mg Tabs (Ferrous Sulfate) .... Take 1 Tablet By Mouth Two Times A Day 9)  Ciprofloxacin Hcl 500 Mg Tabs (Ciprofloxacin Hcl) .Marland Kitchen.. 1 By Mouth Two Times A Day For 13 Days 10)  Metronidazole 500 Mg Tabs (Metronidazole) .Marland Kitchen.. 1 By  Mouth Three Times A Day For 13 Days  Allergies (verified): 1)  ! Hydroxyzine Hcl (Hydroxyzine Hcl)  Comments:  Nurse/Medical Assistant: 4 month rov--also hospital FU for diverticulitis, discharged 12.30/10--c/o dizziness, nausea, weakness--has discharge from vagina of grayish color  The patient's medications and allergies were reviewed with the patient and were updated in the Medication and Allergy Lists. Gladis Riffle, RN (April 19, 2009 2:51 PM)  Past History:  Past Surgical History: Last updated: 01/04/2008 Temporal Artery Bx Cataract extraction Cholecystectomy Hysterectomy Colonoscopy Appendectomy  Family History: Last updated: 01/04/2008 Family History of CAD Female 1st degree relative <50 Family History of Colon CA 1st degree relative <60: Son X 2 Family History Diabetes 1st degree relative-daughter Family History of Stroke M 1st degree relative <50 Family History of Cardiovascular disorder: Father  Social History: Last updated: 01/04/2008 Retired Single Current Smoker Alcohol Use - no Illicit Drug Use - no  Risk Factors: Smoking Status: current (04/19/2009) Packs/Day: 0.5 (04/19/2009)  Past Medical History: Hyperglycemic Polymyalgia Rheumatoid COPD Diabetes mellitus, type II GERD Anxiety Diverticulitis with perforation 12/10...abscess with percutaneous drainage  Social History: Packs/Day:  0.5  Physical Exam  General:  Well-developed,well-nourished,in no acute distress; alert,appropriate and cooperative throughout examination Head:  normocephalic and atraumatic.   Eyes:  pupils equal and pupils round.   Ears:  R ear normal and L ear normal.   Nose:  no external deformity.   Neck:  No deformities, masses, or  tenderness noted. Lungs:  Normal respiratory effort, chest expands symmetrically. Lungs are clear to auscultation, no crackles or wheezes. Heart:  Normal rate and regular rhythm. S1 and S2 normal without gallop, murmur, click, rub or other extra  sounds. Abdomen:  normal bowel sounds. Nondistended. Soft and nontender with no masses palpated. She has couple of small scattered bruises right and left lower abdomen Msk:  FROM both shoulders Pulses:  R radial normal and L radial normal.   Neurologic:  cranial nerves II-XII intact.   Skin:  turgor normal and color normal.   Psych:  normally interactive and good eye contact.     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 Laboratory test [phone]) Venipuncture [phone]) Gastroenterology Referral (GI) TLB-CBC Platelet - w/Differential (85025-CBCD)  Problem # 2:  PERITONEAL ABSCESS (ION-629.52)  Orders: Sedimentation Rate, non-automated (84132) T- * Misc. Laboratory test [phone]) Venipuncture [phone]) Gastroenterology Referral (GI) TLB-CBC Platelet - w/Differential (85025-CBCD)  Problem # 3:  UTI (ICD-599.0)  note MRSA  Her updated medication list for this problem includes:    Ciprofloxacin Hcl 500 Mg Tabs (Ciprofloxacin hcl) .Marland Kitchen... 1 by mouth two times a day for 13 days    Metronidazole 500 Mg Tabs (Metronidazole) .Marland Kitchen... 1 by mouth three times a day for 13 days  Complete Medication List: 1)  Alprazolam 0.5 Mg Tabs (Alprazolam) .... Take 1 tablet by mouth once a day-- each rx to last 30 days 2)  Aspir-81 81 Mg Tbec (Aspirin) .... Take 1 tablet by mouth once a day 3)  Zegerid 40-1100 Mg Caps (Omeprazole-sodium bicarbonate) .... Take 1 capsule by mouth once a day 4)  Hydrocodone-acetaminophen 7.5-650 Mg Tabs (Hydrocodone-acetaminophen) .... Take 1 tablet by mouth three times a day as needed pain 5)  Prednisone 5 Mg Tabs (Prednisone) .... Take 3 by mouth once daily or as directed 6)  Voltaren 1 % Gel (Diclofenac sodium) .... Apply two times a day to shoulder as needed pain 7)  Fluticasone Propionate 50 Mcg/act Susp (Fluticasone propionate) .... 2 sprays each  nostril once daily 8)  Ferrous Sulfate 325 (65 Fe) Mg Tabs (Ferrous sulfate) .... Take 1 tablet by mouth two times a day 9)  Ciprofloxacin Hcl 500 Mg Tabs (Ciprofloxacin hcl) .Marland Kitchen.. 1 by mouth two times a day for 13 days 10)  Metronidazole 500 Mg Tabs (Metronidazole) .Marland Kitchen.. 1 by mouth three times a day for 13 days  Laboratory Results   Blood Tests     SED rate: 50 mm/hr  Comments: Rita Ohara  April 19, 2009 3:57 PM

## 2010-05-06 NOTE — Assessment & Plan Note (Signed)
Summary: 2 MTH ROV // RS   Vital Signs:  Patient profile:   77 year old female Weight:      138 pounds Temp:     98.2 degrees F oral Pulse rate:   56 / minute Pulse rhythm:   regular Resp:     12 per minute BP sitting:   120 / 58  (left arm) Cuff size:   regular  Vitals Entered By: Gladis Riffle, RN (July 23, 2009 11:27 AM) CC: 2 month rov, Is Patient Diabetic? Yes Did you bring your meter with you today? No Comments does not check CBGs at home--states had quit smoking and gone back   Primary Care Provider:  Birdie Sons MD  CC:  2 month rov and .  History of Present Illness:  Follow-Up Visit      This is a 77 year old woman who presents for Follow-up visit.  The patient denies chest pain and palpitations.  Since the last visit the patient notes a recent hospitilization and being seen by a specialist.  The patient reports taking meds as prescribed.  When questioned about possible medication side effects, the patient notes none.  Reviewed hospital records and surgical reports (tannenbaum and ingram). She has some dizziness: she describes true spinning sensation. episodes are short lived and always associated with rapid head turning.   All other systems reviewed and were negative   Preventive Screening-Counseling & Management  Alcohol-Tobacco     Smoking Status: current     Packs/Day: 0.5  Current Problems (verified): 1)  Benign Positional Vertigo  (ICD-386.11) 2)  Diverticulitis of Colon  (ICD-562.11) 3)  Rectovaginal Fistula  (ICD-619.1) 4)  Anxiety  (ICD-300.00) 5)  Sleep Apnea  (ICD-780.57) 6)  Pulmonary Embolism, Hx of  (ICD-V12.51) 7)  Hiatal Hernia  (ICD-553.3) 8)  Peripheral Vascular Disease  (ICD-443.9) 9)  Cad  (ICD-414.00) 10)  Diabetes Mellitus, Type II  (ICD-250.00) 11)  Polymyalgia Rheumatica  (ICD-725) 12)  Hypertension  (ICD-401.9) 13)  Hyperlipidemia  (ICD-272.4) 14)  Gerd  (ICD-530.81) 15)  COPD  (ICD-496)  Current Medications (verified): 1)   Alprazolam 0.5 Mg Tabs (Alprazolam) .... Take 1 Tablet By Mouth Once A Day-- Each Rx To Last 30 Days 2)  Aspir-81 81 Mg Tbec (Aspirin) .... Take 1 Tablet By Mouth Once A Day 3)  Zegerid 40-1100 Mg Caps (Omeprazole-Sodium Bicarbonate) .... Take 1 Capsule By Mouth Once A Day 4)  Hydrocodone-Acetaminophen 7.5-650 Mg Tabs (Hydrocodone-Acetaminophen) .... Take 1 Tablet By Mouth Three Times A Day As Needed Pain 5)  Prednisone 5 Mg Tabs (Prednisone) .... Take1/2 By Mouth Once Daily or As Directed 6)  Voltaren 1 % Gel (Diclofenac Sodium) .... Apply Two Times A Day To Shoulder As Needed Pain 7)  Ferrous Sulfate 325 (65 Fe) Mg Tabs (Ferrous Sulfate) .... Take 1 Tablet By Mouth Two Times A Day 8)  Metoprolol Succinate 25 Mg Tb24 (Metoprolol Succinate) .... Two Times A Day 9)  Zofran 4 Mg Tabs (Ondansetron Hcl) .Marland Kitchen.. 1 By Mouth Every 8 Hours As Needed For Nausea 10)  Amlodipine Besylate 5 Mg Tabs (Amlodipine Besylate) .... Take 1 Tablet By Mouth Once A Day 11)  Claritin 10 Mg Tabs (Loratadine) .... Once Daily As Needed 12)  Meclizine Hcl 25 Mg Tabs (Meclizine Hcl) .Marland Kitchen.. 1 Q 4 Hours As Needed Dizziness  Allergies: 1)  ! Hydroxyzine Hcl (Hydroxyzine Hcl)  Past History:  Past Medical History: Last updated: 04/23/2009 Hyperglycemic Polymyalgia Rheumatoid COPD Diabetes mellitus, type II GERD Anxiety  Diverticulitis with MICRO perforation 12/10/ABSCESS  Past Surgical History: Last updated: 01/04/2008 Temporal Artery Bx Cataract extraction Cholecystectomy Hysterectomy Colonoscopy Appendectomy  Family History: Last updated: 01/04/2008 Family History of CAD Female 1st degree relative <50 Family History of Colon CA 1st degree relative <60: Son X 2 Family History Diabetes 1st degree relative-daughter Family History of Stroke M 1st degree relative <50 Family History of Cardiovascular disorder: Father  Social History: Last updated: 01/04/2008 Retired Single Current Smoker Alcohol Use -  no Illicit Drug Use - no  Risk Factors: Smoking Status: current (07/23/2009) Packs/Day: 0.5 (07/23/2009)  Review of Systems       All other systems reviewed and were negative   Physical Exam  General:  Well-developed,well-nourished,in no acute distress; alert,appropriate and cooperative throughout examination Head:  normocephalic and atraumatic.   Eyes:  pupils equal and pupils round.   Ears:  R ear normal and L ear normal.   Nose:  no external deformity and no external erythema.   Neck:  No deformities, masses, or tenderness noted. Lungs:  normal respiratory effort and no intercostal retractions.   Heart:  normal rate and regular rhythm.   Abdomen:  normal bowel sounds. Nondistended. Soft and nontender with no masses palpated. She has couple of small scattered bruises right and left lower abdomen Msk:  No deformity or scoliosis noted of thoracic or lumbar spine.   Pulses:  R radial normal and L radial normal.   Neurologic:  cranial nerves II-XII intact and gait normal.     Impression & Recommendations:  Problem # 1:  RECTOVAGINAL FISTULA (ICD-619.1) s/p repair reviewed hospital notes (dr Derrell Lolling)  Problem # 2:  DIABETES MELLITUS, TYPE II (ICD-250.00) previously controlled Her updated medication list for this problem includes:    Aspir-81 81 Mg Tbec (Aspirin) .Marland Kitchen... Take 1 tablet by mouth once a day  Labs Reviewed: Creat: 0.8 (05/01/2009)     Last Eye Exam: normal-patient's report (04/06/2008) Reviewed HgBA1c results: 6.2 (12/28/2008)  6.3 (05/18/2008)  Problem # 3:  HYPERTENSION (ICD-401.9) controlled continue current medications  Her updated medication list for this problem includes:    Metoprolol Succinate 25 Mg Tb24 (Metoprolol succinate) .Marland Kitchen..Marland Kitchen Two times a day    Amlodipine Besylate 5 Mg Tabs (Amlodipine besylate) .Marland Kitchen... Take 1 tablet by mouth once a day  BP today: 120/58 Prior BP: 134/74 (07/17/2009)  Prior 10 Yr Risk Heart Disease: Not enough information  (01/12/2007)  Labs Reviewed: K+: 4.2 (05/01/2009) Creat: : 0.8 (05/01/2009)   Chol: 176 (12/28/2008)   HDL: 30.70 (12/28/2008)   LDL: DEL (05/18/2008)   TG: 259.0 (12/28/2008)  Problem # 4:  BENIGN POSITIONAL VERTIGO (ICD-386.11) see new meds meclizine not controlling sxs  The following medications were removed from the medication list:    Zofran 4 Mg Tabs (Ondansetron hcl) .Marland Kitchen... 1 by mouth every 8 hours as needed for nausea    Claritin 10 Mg Tabs (Loratadine) ..... Once daily as needed    Meclizine Hcl 25 Mg Tabs (Meclizine hcl) .Marland Kitchen... 1 q 4 hours as needed dizziness  Problem # 5:  TOBACCO USE (ICD-305.1)  Encouraged smoking cessation and discussed different methods for smoking cessation.   Complete Medication List: 1)  Lorazepam 1 Mg Tabs (Lorazepam) .... Take 1 tab by mouth at bedtime as needed dizziness or insomnia 2)  Aspir-81 81 Mg Tbec (Aspirin) .... Take 1 tablet by mouth once a day 3)  Zegerid 40-1100 Mg Caps (Omeprazole-sodium bicarbonate) .... Take 1 capsule by mouth once a day 4)  Hydrocodone-acetaminophen 7.5-650 Mg Tabs (Hydrocodone-acetaminophen) .... Take 1 tablet by mouth three times a day as needed pain 5)  Prednisone 5 Mg Tabs (Prednisone) .... Take1/2 by mouth once daily or as directed 6)  Voltaren 1 % Gel (Diclofenac sodium) .... Apply two times a day to shoulder as needed pain 7)  Ferrous Sulfate 325 (65 Fe) Mg Tabs (Ferrous sulfate) .... Take 1 tablet by mouth two times a day 8)  Metoprolol Succinate 25 Mg Tb24 (Metoprolol succinate) .... Two times a day 9)  Amlodipine Besylate 5 Mg Tabs (Amlodipine besylate) .... Take 1 tablet by mouth once a day Prescriptions: LORAZEPAM 1 MG  TABS (LORAZEPAM) Take 1 tab by mouth at bedtime as needed dizziness or insomnia  #30 x 1   Entered and Authorized by:   Birdie Sons MD   Signed by:   Birdie Sons MD on 07/23/2009   Method used:   Print then Give to Patient   RxID:   [id number]

## 2010-05-06 NOTE — Letter (Signed)
Summary: [facility]   Imported By: Maryln Gottron 08/14/2009 13:26:52  _____________________________________________________________________  External Attachment:    Type:   Image     Comment:   External Document

## 2010-05-06 NOTE — Progress Notes (Signed)
Summary: SURGICAL CLEARANCE / MED REFILL  Phone Note Call from Patient   Caller: Son [phone] Reason for Call: Talk to Nurse, Talk to Doctor Summary of Call: [name])  called to inquire if surg clearance form from Dr Jacinto Halim office ([facility]) has been received and filled out---Also, seeing about refill on med (METOPROLOL 25MG ) - this is a med that was prescribed by Dr. Jannette Spanner w/ [facility] .... Edie, RN (Triage) at [facility] was contacted and she adv that they received the form from Dr Cato Mulligan but Dr Derrell Lolling hasn't seen it yet but they will get the pts surgery scheduled asap.... In ref to refill on med, [facility] was contacted and adv that Dr. Jannette Spanner would be back in the office on Monday and can be reached via pager # [phone] - He would be able to advise further since he was the prescriber of the med when pt was in [facility].  Initial call taken by: Debbra Riding,  May 03, 2009 3:35 PM  Follow-up for Phone Call        Pt was put on metoprolol in hospital according to son.  No hosp discharge found in our EMR or echart so we have no evidence pt is on this and therefore cannot refill.  I talked with triage nurse of Dr Derrell Lolling and he is out of town.  Another MD in that office will not order without documentation she is on and understands pcp will not either.  This will be addressed by dr Derrell Lolling on 05/06/09 when he returns to the office. Follow-up by: Gladis Riffle, RN,  May 03, 2009 4:21 PM     Appended Document: SURGICAL CLEARANCE / MED REFILL Pts appt rescheduled from 05/06/2009 @ 9:15am   to   05/07/2009 @ 9:30am....Marland KitchenMarland KitchenPts son [name] Cunningham) aware.

## 2010-05-06 NOTE — Assessment & Plan Note (Signed)
Summary: DIZZINESS, UNSTEADY GAIT // RS   Vital Signs:  Patient profile:   77 year old female Weight:      138 pounds BMI:     24.15 O2 Sat:      97 % on Room air Temp:     98 degrees F oral Pulse rate:   52 / minute Pulse rhythm:   regular BP sitting:   140 / 68  (left arm) BP standing:   134 / 74  (left arm) Cuff size:   regular  Vitals Entered By: Raechel Ache, RN (July 17, 2009 11:43 AM)  O2 Flow:  Room air CC: C/o dizziness, off-balance and nausea x 4-5 days; worse when stands up or turns over in bed.   History of Present Illness: Here with her daughter for 4 days of mild intermittent dizziness, which she describes as the room spinning around. She thinks this started after walking outside a few days ago in a strong wind. The dizziness is worse when she rolls over in bed or moves her head quickly. No nausea or vomitting, no HA. No visual changes. No other neurologic deficits. She has a hx of vertigo several years ago, and this feels just like that did. She has had some mild allergy symptoms this spring as well, including sneezing and some sinus congestion.   Allergies: 1)  ! Hydroxyzine Hcl (Hydroxyzine Hcl)  Past History:  Past Medical History: Reviewed history from 04/23/2009 and no changes required. Hyperglycemic Polymyalgia Rheumatoid COPD Diabetes mellitus, type II GERD Anxiety Diverticulitis with MICRO perforation 12/10/ABSCESS  Past Surgical History: Reviewed history from 01/04/2008 and no changes required. Temporal Artery Bx Cataract extraction Cholecystectomy Hysterectomy Colonoscopy Appendectomy  Review of Systems  The patient denies anorexia, fever, weight loss, weight gain, vision loss, decreased hearing, hoarseness, chest pain, syncope, dyspnea on exertion, peripheral edema, prolonged cough, headaches, hemoptysis, abdominal pain, melena, hematochezia, severe indigestion/heartburn, hematuria, incontinence, genital sores, muscle weakness,  suspicious skin lesions, transient blindness, difficulty walking, depression, unusual weight change, abnormal bleeding, enlarged lymph nodes, angioedema, breast masses, and testicular masses.    Physical Exam  General:  Well-developed,well-nourished,in no acute distress; alert,appropriate and cooperative throughout examination Head:  Normocephalic and atraumatic without obvious abnormalities. No apparent alopecia or balding. Eyes:  No corneal or conjunctival inflammation noted. EOMI. Perrla. Funduscopic exam benign, without hemorrhages, exudates or papilledema. Vision grossly normal. Ears:  External ear exam shows no significant lesions or deformities.  Otoscopic examination reveals clear canals, tympanic membranes are intact bilaterally without bulging, retraction, inflammation or discharge. Hearing is grossly normal bilaterally. Nose:  External nasal examination shows no deformity or inflammation. Nasal mucosa are pink and moist without lesions or exudates. Mouth:  Oral mucosa and oropharynx without lesions or exudates.  Teeth in good repair. Neck:  No deformities, masses, or tenderness noted. Lungs:  Normal respiratory effort, chest expands symmetrically. Lungs are clear to auscultation, no crackles or wheezes. Heart:  Normal rate and regular rhythm. S1 and S2 normal without gallop, murmur, click, rub or other extra sounds. Neurologic:  No cranial nerve deficits noted. Station and gait are normal. Plantar reflexes are down-going bilaterally. DTRs are symmetrical throughout. Sensory, motor and coordinative functions appear intact.   Impression & Recommendations:  Problem # 1:  BENIGN POSITIONAL VERTIGO (ICD-386.11)  Her updated medication list for this problem includes:    Zofran 4 Mg Tabs (Ondansetron hcl) .Marland Kitchen... 1 by mouth every 8 hours as needed for nausea    Claritin 10 Mg Tabs (Loratadine) .Marland KitchenMarland KitchenMarland KitchenMarland Kitchen  Once daily as needed    Meclizine Hcl 25 Mg Tabs (Meclizine hcl) .Marland Kitchen... 1 q 4 hours as needed  dizziness  Orders: Prescription Created Electronically [phone])  Problem # 2:  ALLERGIC RHINITIS (ICD-477.9)  The following medications were removed from the medication list:    Fluticasone Propionate 50 Mcg/act Susp (Fluticasone propionate) .Marland Kitchen... 2 sprays each nostril once daily Her updated medication list for this problem includes:    Claritin 10 Mg Tabs (Loratadine) ..... Once daily as needed  Problem # 3:  HYPERTENSION (ICD-401.9)  Her updated medication list for this problem includes:    Metoprolol Succinate 25 Mg Tb24 (Metoprolol succinate) .Marland Kitchen..Marland Kitchen Two times a day    Amlodipine Besylate 5 Mg Tabs (Amlodipine besylate) .Marland Kitchen... Take 1 tablet by mouth once a day  Complete Medication List: 1)  Alprazolam 0.5 Mg Tabs (Alprazolam) .... Take 1 tablet by mouth once a day-- each rx to last 30 days 2)  Aspir-81 81 Mg Tbec (Aspirin) .... Take 1 tablet by mouth once a day 3)  Zegerid 40-1100 Mg Caps (Omeprazole-sodium bicarbonate) .... Take 1 capsule by mouth once a day 4)  Hydrocodone-acetaminophen 7.5-650 Mg Tabs (Hydrocodone-acetaminophen) .... Take 1 tablet by mouth three times a day as needed pain 5)  Prednisone 5 Mg Tabs (Prednisone) .... Take1/2 by mouth once daily or as directed 6)  Voltaren 1 % Gel (Diclofenac sodium) .... Apply two times a day to shoulder as needed pain 7)  Ferrous Sulfate 325 (65 Fe) Mg Tabs (Ferrous sulfate) .... Take 1 tablet by mouth two times a day 8)  Metoprolol Succinate 25 Mg Tb24 (Metoprolol succinate) .... Two times a day 9)  Zofran 4 Mg Tabs (Ondansetron hcl) .Marland Kitchen.. 1 by mouth every 8 hours as needed for nausea 10)  Amlodipine Besylate 5 Mg Tabs (Amlodipine besylate) .... Take 1 tablet by mouth once a day 11)  Claritin 10 Mg Tabs (Loratadine) .... Once daily as needed 12)  Meclizine Hcl 25 Mg Tabs (Meclizine hcl) .Marland Kitchen.. 1 q 4 hours as needed dizziness  Patient Instructions: 1)  rest, drink fluids,  2)  Please schedule a follow-up appointment as needed .    Prescriptions: MECLIZINE HCL 25 MG TABS (MECLIZINE HCL) 1 q 4 hours as needed dizziness  #60 x 5   Entered and Authorized by:   Nelwyn Salisbury MD   Signed by:   Nelwyn Salisbury MD on 07/17/2009   Method used:   Electronically to        [id number] (retail)       [address]       [place], [place]  [address]       Ph: [phone]       Fax: [phone]   RxID:   [id number]

## 2010-05-06 NOTE — Progress Notes (Signed)
Summary: REQ FOR APPT  Phone Note Call from Patient   Caller:  Patient  [phone] Reason for Call: Talk to Nurse, Talk to Doctor Summary of Call: Pt calls today to req a post hosp f/u appt w/ Dr Cato Mulligan for next week... Pt adv that she returns for her f/u appt w/ surgeon (Dr Derrell Lolling) on 07/04/2009..... Pt adv that Dr Patsi Sears repaired her bladder / Dr Derrell Lolling placed her fistula and did removal / repair on her intestines.... Pt would like to come in next week if possible for M30 appt to f/u on same..... Can you advise a date/time that pt could be worked into schedule?  Pt understands that Dr Cato Mulligan is OOT and will not return till 06/24/2009.  Pt can be reached [phone] with any questions or concerns.  Initial call taken by: Debbra Riding,  June 21, 2009 10:23 AM

## 2010-05-06 NOTE — Progress Notes (Signed)
Summary: refill hydrocodone  Phone Note From Pharmacy   [facility]  [address] [id number] Call For: swords  Summary of Call: refill hydrocodone 7.5/650 1 by mouth three times a day. I don't see on med list anymore Initial call taken by: Alfred Levins, CMA,  February 13, 2010 2:00 PM  Follow-up for Phone Call        decline Follow-up by: Birdie Sons MD,  February 13, 2010 4:21 PM  Additional Follow-up for Phone Call Additional follow up Details #1::        Phone call completed Additional Follow-up by: Alfred Levins, CMA,  February 14, 2010 8:19 AM

## 2010-05-06 NOTE — Assessment & Plan Note (Signed)
Summary: uti/dm   Vital Signs:  Patient profile:   77 year old female Weight:      150 pounds Temp:     98 degrees F oral BP sitting:   120 / 60  (left arm) Cuff size:   regular  Vitals Entered By: Sid Falcon LPN (April 10, 2009 4:52 PM) CC: UTI sx   History of Present Illness: Acute visit. She had recent hospitalization 25th of December 30 30th for complicated diverticulitis. Apparently had evidence for small perforation. No surgery done.  D/C summary unavailable at this time. Patient on Cipro and metronidazole. She is doing fairly well this time but couple of days ago noted some ?dried blood when she wiped after urinating. Has had some mild urinary symptoms of urgency since Foley catheter discontinued. No fever. She is eating fairly well.  Allergies (verified): No Known Drug Allergies  Past History:  Past Medical History: Hyperglycemic Polymyalgia Rheumatoid COPD Diabetes mellitus, type II GERD Anxiety Diverticulitis with perforation 12/10  Review of Systems  The patient denies anorexia, fever, abdominal pain, melena, hematochezia, and incontinence.    Physical Exam  General:  Well-developed,well-nourished,in no acute distress; alert,appropriate and cooperative throughout examination Mouth:  Oral mucosa and oropharynx without lesions or exudates.  Teeth in good repair. Lungs:  Normal respiratory effort, chest expands symmetrically. Lungs are clear to auscultation, no crackles or wheezes. Heart:  Normal rate and regular rhythm. S1 and S2 normal without gallop, murmur, click, rub or other extra sounds. Abdomen:  normal bowel sounds. Nondistended. Soft and nontender with no masses palpated. She has couple of small scattered bruises right and left lower abdomen   Impression & Recommendations:  Problem # 1:  PYURIA (ICD-791.9) Patient presents with pyuria and hematuria after recent catheterization during hospitalization. No fever. Certainly possibility that she has  more resistant pathogen with recent Foley catheter in hospitalization at this point she does not have any fever or chills. Urine culture sent. Continue Cipro for the time being. Immediate followup for any fever.    Complete Medication List: 1)  Alprazolam 0.5 Mg Tabs (Alprazolam) .... Take 1 tablet by mouth once a day-- each rx to last 30 days 2)  Aspir-81 81 Mg Tbec (Aspirin) .... Take 1 tablet by mouth once a day 3)  Lisinopril-hydrochlorothiazide 20-25 Mg Tabs (Lisinopril-hydrochlorothiazide) .... One by mouth daily 4)  Zegerid 40-1100 Mg Caps (Omeprazole-sodium bicarbonate) .... Take 1 capsule by mouth once a day 5)  Hydrocodone-acetaminophen 7.5-650 Mg Tabs (Hydrocodone-acetaminophen) .... Take 1 tablet by mouth three times a day as needed pain 6)  Benicar 40 Mg Tabs (Olmesartan medoxomil) .... Take 1 tablet by mouth once a day 7)  Prednisone 5 Mg Tabs (Prednisone) .... Take 3 by mouth once daily or as directed 8)  Voltaren 1 % Gel (Diclofenac sodium) .... Apply two times a day to shoulder as needed pain 9)  Fluticasone Propionate 50 Mcg/act Susp (Fluticasone propionate) .... 2 sprays each nostril once daily 10)  Hydroxyzine Hcl 25 Mg Tabs (Hydroxyzine hcl) .... One every morning as needed anxiety  Other Orders: UA Dipstick w/o Micro (automated)  (81003) T-Culture, Urine ([id number])  Patient Instructions: 1)  Continue current medications. 2)  Drink plenty of water. 3)  Follow up immediately if you have any fever or chills or worsening symptoms.  Laboratory Results   Urine Tests    Routine Urinalysis   Color: yellow Appearance: Cloudy Glucose: negative   (Normal Range: Negative) Bilirubin: negative   (Normal Range: Negative) Ketone:  negative   (Normal Range: Negative) Spec. Gravity: 1.010   (Normal Range: 1.003-1.035) Blood: 1+   (Normal Range: Negative) pH: 6.0   (Normal Range: 5.0-8.0) Protein: 1+   (Normal Range: Negative) Urobilinogen: 0.2   (Normal Range:  0-1) Nitrite: negative   (Normal Range: Negative) Leukocyte Esterace: 3+   (Normal Range: Negative)    Comments: Joanne Chars CMA  April 10, 2009 4:39 PM

## 2010-05-06 NOTE — Letter (Signed)
Summary: [facility]   Imported By: Lester Crump 06/21/2009 10:45:11  _____________________________________________________________________  External Attachment:    Type:   Image     Comment:   External Document

## 2010-05-06 NOTE — Assessment & Plan Note (Signed)
Summary: emp/pt coming in fasting/pt req flu shot/cjr   Vital Signs:  Patient profile:   77 year old female Height:      63.5 inches Weight:      146 pounds Temp:     98.6 degrees F oral Pulse rate:   60 / minute Pulse rhythm:   regular Resp:     12 per minute BP sitting:   152 / 72  (right arm) Cuff size:   regular  Vitals Entered By: Sid Falcon LPN (January 09, 2010 10:15 AM)  Primary Care Ashten Sarnowski:  Birdie Sons MD   History of Present Illness:  Follow-Up Visit      This is a 77 year old woman who presents for Follow-up visit.  The patient denies chest pain and palpitations.  Since the last visit the patient notes no new problems or concerns--multiple chronic problems: constipation, diffuse aching, overall sensation of not feeling well .  The patient reports taking meds as prescribed.  When questioned about possible medication side effects, the patient notes none.    All other systems reviewed and were negative   Current Problems (verified): 1)  Tobacco Use  (ICD-305.1) 2)  Benign Positional Vertigo  (ICD-386.11) 3)  Diverticulitis of Colon  (ICD-562.11) 4)  Anxiety  (ICD-300.00) 5)  Sleep Apnea  (ICD-780.57) 6)  Pulmonary Embolism, Hx of  (ICD-V12.51) 7)  Hiatal Hernia  (ICD-553.3) 8)  Peripheral Vascular Disease  (ICD-443.9) 9)  Cad  (ICD-414.00) 10)  Diabetes Mellitus, Type II  (ICD-250.00) 11)  Polymyalgia Rheumatica  (ICD-725) 12)  Hypertension  (ICD-401.9) 13)  Hyperlipidemia  (ICD-272.4) 14)  Gerd  (ICD-530.81) 15)  COPD  (ICD-496)  Current Medications (verified): 1)  Aspir-81 81 Mg Tbec (Aspirin) .... Take 1 Tablet By Mouth Once A Day 2)  Zegerid 40-1100 Mg Caps (Omeprazole-Sodium Bicarbonate) .... Take 1 Capsule By Mouth Once A Day 3)  Prednisone 5 Mg Tabs (Prednisone) .... Take1/2 By Mouth Once Daily or As Directed 4)  Voltaren 1 % Gel (Diclofenac Sodium) .... Apply Two Times A Day To Shoulder As Needed Pain 5)  Ferrous Sulfate 325 (65 Fe) Mg Tabs  (Ferrous Sulfate) .... Take 1 Tablet By Mouth Two Times A Day 6)  Metoprolol Succinate 25 Mg Tb24 (Metoprolol Succinate) .... Two Times A Day 7)  Amlodipine Besylate 5 Mg Tabs (Amlodipine Besylate) .... Take 1 Tablet By Mouth Once A Day 8)  Alprazolam 1 Mg Tabs (Alprazolam) .... Take 1 Tablet By Mouth At Bedtime As Needed 9)  Fluticasone Propionate 50 Mcg/act Susp (Fluticasone Propionate) .... Two Sprays Each Nostril Once Daily  Allergies: 1)  ! Hydroxyzine Hcl (Hydroxyzine Hcl)  Past History:  Past Medical History: Last updated: 04/23/2009 Hyperglycemic Polymyalgia Rheumatoid COPD Diabetes mellitus, type II GERD Anxiety Diverticulitis with MICRO perforation 12/10/ABSCESS  Past Surgical History: Last updated: 01/04/2008 Temporal Artery Bx Cataract extraction Cholecystectomy Hysterectomy Colonoscopy Appendectomy  Family History: Last updated: 01/04/2008 Family History of CAD Female 1st degree relative <50 Family History of Colon CA 1st degree relative <60: Son X 2 Family History Diabetes 1st degree relative-daughter Family History of Stroke M 1st degree relative <50 Family History of Cardiovascular disorder: Father  Social History: Last updated: 01/04/2008 Retired Single Current Smoker Alcohol Use - no Illicit Drug Use - no  Risk Factors: Smoking Status: current (07/23/2009) Packs/Day: 0.5 (07/23/2009)  Physical Exam  General:  alert and well-developed.   Head:  normocephalic and atraumatic.   Ears:  R ear normal and L ear normal.  Neck:  No deformities, masses, or tenderness noted. Lungs:  normal respiratory effort and no intercostal retractions.   Heart:  normal rate and regular rhythm.   Abdomen:  soft and non-tender.   obese Skin:  turgor normal and color normal.   Psych:  good eye contact and not anxious appearing.    Diabetes Management Exam:    Eye Exam:       Eye Exam done elsewhere          Date: 01/04/2010          Results: normal-pt's  report          Done by: Emily Filbert   Impression & Recommendations:  Problem # 1:  GAIT DISTURBANCE (ICD-781.2) i'm concerned about fall risk refer to PT Orders: Physical Therapy Referral (PT)  Problem # 2:  DIABETES MELLITUS, TYPE II (ICD-250.00) no meds check labs today Her updated medication list for this problem includes:    Aspir-81 81 Mg Tbec (Aspirin) .Marland Kitchen... Take 1 tablet by mouth once a day  Orders: Specimen Handling ([address]109) TLB-A1C / Hgb A1C (Glycohemoglobin) (83036-A1C)  Problem # 3:  HYPERLIPIDEMIA (ICD-272.4) no meds check labs Orders: Specimen Handling (60454) TLB-Lipid Panel (80061-LIPID) TLB-TSH (Thyroid Stimulating Hormone) (84443-TSH) TLB-Hepatic/Liver Function Pnl (80076-HEPATIC)  Labs Reviewed: SGOT: 17 (04/19/2009)   SGPT: 9 (04/19/2009)  Lipid Goals: Chol Goal: 200 (01/12/2007)   HDL Goal: 40 (01/12/2007)   LDL Goal: 100 (01/12/2007)   TG Goal: 150 (01/12/2007)  Prior 10 Yr Risk Heart Disease: Not enough information (01/12/2007)   HDL:30.70 (12/28/2008), 30.2 (05/18/2008)  LDL:DEL (05/18/2008), DEL (01/26/2008)  Chol:176 (12/28/2008), 193 (05/18/2008)  Trig:259.0 (12/28/2008), 258 (05/18/2008)  Problem # 4:  CAD (ICD-414.00)  no sxs continue current medications  Her updated medication list for this problem includes:    Aspir-81 81 Mg Tbec (Aspirin) .Marland Kitchen... Take 1 tablet by mouth once a day    Metoprolol Succinate 25 Mg Tb24 (Metoprolol succinate) .Marland Kitchen..Marland Kitchen Two times a day    Amlodipine Besylate 5 Mg Tabs (Amlodipine besylate) .Marland Kitchen... Take 1 tablet by mouth once a day  Labs Reviewed: Chol: 176 (12/28/2008)   HDL: 30.70 (12/28/2008)   LDL: DEL (05/18/2008)   TG: 259.0 (12/28/2008)  Lipid Goals: Chol Goal: 200 (01/12/2007)   HDL Goal: 40 (01/12/2007)   LDL Goal: 100 (01/12/2007)   TG Goal: 150 (01/12/2007)  Complete Medication List: 1)  Aspir-81 81 Mg Tbec (Aspirin) .... Take 1 tablet by mouth once a day 2)  Zegerid 40-1100 Mg Caps (Omeprazole-sodium  bicarbonate) .... Take 1 capsule by mouth once a day 3)  Prednisone 5 Mg Tabs (Prednisone) .... Take1/2 by mouth once daily or as directed 4)  Voltaren 1 % Gel (Diclofenac sodium) .... Apply two times a day to shoulder as needed pain 5)  Ferrous Sulfate 325 (65 Fe) Mg Tabs (Ferrous sulfate) .... Take 1 tablet by mouth two times a day 6)  Metoprolol Succinate 25 Mg Tb24 (Metoprolol succinate) .... Two times a day 7)  Amlodipine Besylate 5 Mg Tabs (Amlodipine besylate) .... Take 1 tablet by mouth once a day 8)  Alprazolam 1 Mg Tabs (Alprazolam) .... Take 1 tablet by mouth at bedtime as needed 9)  Fluticasone Propionate 50 Mcg/act Susp (Fluticasone propionate) .... Two sprays each nostril once daily  Other Orders: Flu Vaccine 60yrs + MEDICARE PATIENTS (U9811) Administration Flu vaccine - MCR (G0008) Venipuncture (91478) TLB-BMP (Basic Metabolic Panel-BMET) (80048-METABOL) TLB-CBC Platelet - w/Differential (85025-CBCD)  Flu Vaccine Consent Questions     Do you  have a history of severe allergic reactions to this vaccine? no    Any prior history of allergic reactions to egg and/or gelatin? no    Do you have a sensitivity to the preservative Thimersol? no    Do you have a past history of Guillan-Barre Syndrome? no    Do you currently have an acute febrile illness? no    Have you ever had a severe reaction to latex? no    Vaccine information given and explained to patient? yes    Are you currently pregnant? no    Lot Number:[id number]   Exp Date:10/04/2010   Site Given  Left Deltoid IMdflu

## 2010-05-06 NOTE — Letter (Signed)
Summary: [facility]   Imported By: Sherian Rein 05/16/2009 10:17:35  _____________________________________________________________________  External Attachment:    Type:   Image     Comment:   External Document

## 2010-05-06 NOTE — Miscellaneous (Signed)
Summary: PT Discharge [facility]  PT Discharge [facility]   Imported By: Maryln Gottron 03/05/2010 12:53:18  _____________________________________________________________________  External Attachment:    Type:   Image     Comment:   External Document

## 2010-05-06 NOTE — Assessment & Plan Note (Signed)
Summary: SURG CLEARANCE CONCERNS // RS   Vital Signs:  Patient profile:   77 year old female Weight:      144 pounds Temp:     97.9 degrees F  Vitals Entered By: Gladis Riffle, RN (May 07, 2009 10:03 AM) CC: surgical clearance   Primary Care Provider:  Birdie Sons MD  CC:  surgical clearance.  History of Present Illness: asked to see by dr Derrell Lolling for surgical  clearance prior to recto-vaginal fistula.  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 Take 1 Tablet By Mouth Once A Day-- Each Rx To Last 30 Days 2)  Aspir-81 81 Mg Tbec (Aspirin) .... Take 1 Tablet By Mouth Once A Day 3)  Zegerid 40-1100 Mg Caps (Omeprazole-Sodium Bicarbonate) .... Take 1 Capsule By Mouth Once A Day 4)  Hydrocodone-Acetaminophen 7.5-650 Mg Tabs (Hydrocodone-Acetaminophen) .... Take 1 Tablet By Mouth Three Times A Day As Needed Pain 5)  Prednisone 5 Mg Tabs (Prednisone) .... Take1/2 By Mouth Once Daily or As  Directed 6)  Voltaren 1 % Gel (Diclofenac Sodium) .... Apply Two Times A Day To Shoulder As Needed Pain 7)  Fluticasone Propionate 50 Mcg/act  Susp (Fluticasone Propionate) .... 2 Sprays Each Nostril Once Daily 8)  Ferrous Sulfate 325 (65 Fe) Mg Tabs (Ferrous Sulfate) .... Take 1 Tablet By Mouth Two Times A Day 9)  Ciprofloxacin Hcl 500 Mg Tabs (Ciprofloxacin Hcl) .Marland Kitchen.. 1 By Mouth Two Times A Day For 13 Days 10)  Metronidazole 500 Mg Tabs (Metronidazole) .Marland Kitchen.. 1 By Mouth Three Times A Day For 13 Days 11)  Metoprolol Succinate 25 Mg Tb24 (Metoprolol Succinate) .... Two Times A Day  Allergies: 1)  ! Hydroxyzine Hcl (Hydroxyzine Hcl)  Past History:  Past Medical History: Last updated: 04/23/2009 Hyperglycemic Polymyalgia Rheumatoid COPD Diabetes mellitus, type II GERD Anxiety Diverticulitis with MICRO perforation 12/10/ABSCESS  Past Surgical History: Last updated: 01/04/2008 Temporal Artery Bx Cataract extraction Cholecystectomy Hysterectomy Colonoscopy Appendectomy  Family History: Last updated: 01/04/2008 Family History of CAD Female 1st degree relative <50 Family History of Colon CA 1st degree relative <60: Son X 2 Family History Diabetes 1st degree relative-daughter Family History of Stroke M 1st degree relative <50 Family History of Cardiovascular disorder: Father  Social History: Last updated: 01/04/2008 Retired Single Current Smoker Alcohol Use - no Illicit Drug Use - no  Risk Factors: Smoking Status: current (05/07/2009) Packs/Day: 0.5 (05/07/2009)  Review of Systems       All other systems reviewed and were negative  Physical Exam  General:  Well-developed,well-nourished,in no acute distress; alert,appropriate and cooperative throughout examination Head:  normocephalic and atraumatic.   Eyes:  pupils equal and pupils round.   Ears:  R ear normal and L ear normal.   Nose:  no external deformity and no external erythema.   Neck:  No deformities, masses,  or tenderness noted. Chest Wall:  No deformities, masses, or tenderness noted. Lungs:  normal respiratory effort and no intercostal retractions.   Heart:  normal rate and regular rhythm.   Abdomen:  normal bowel sounds. Nondistended. Soft and nontender with no masses palpated. She has couple of small scattered bruises right and left lower abdomen Msk:  FROM both shoulders Neurologic:  cranial nerves II-XII intact and gait normal.   Skin:  turgor normal and color normal.   Psych:  memory intact for recent and remote and good eye contact.     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 She is on very low dose prednisone now---need for perioperative increase in prednisone is likely not necessary.  Problem # 2:  DIABETES MELLITUS, TYPE II (ICD-250.00) no meds  Her updated medication list for this problem includes:    Aspir-81 81 Mg Tbec (Aspirin) .Marland Kitchen... Take 1 tablet by mouth once a day  Problem # 3:  CAD (ICD-414.00)  Her updated medication list for this problem includes:    Aspir-81 81 Mg Tbec (Aspirin) .Marland Kitchen... Take 1 tablet by mouth once a day    Metoprolol Succinate 25 Mg Tb24 (Metoprolol succinate) .Marland Kitchen..Marland Kitchen Two times a day  Labs Reviewed: Chol: 176 (12/28/2008)   HDL: 30.70 (12/28/2008)   LDL: DEL (05/18/2008)   TG: 259.0 (12/28/2008)  Lipid Goals: Chol Goal: 200 (01/12/2007)   HDL Goal: 40 (01/12/2007)   LDL Goal: 100 (01/12/2007)   TG Goal: 150 (01/12/2007)  Orders: Cardiolite (Cardiolite)  Complete Medication List: 1)  Alprazolam 0.5 Mg Tabs (Alprazolam) .... Take 1 tablet by mouth once a day-- each rx to last 30 days 2)  Aspir-81 81 Mg Tbec (Aspirin) .... Take 1 tablet by mouth once a day 3)  Zegerid 40-1100 Mg Caps (Omeprazole-sodium bicarbonate) .... Take 1 capsule by mouth once a day 4)  Hydrocodone-acetaminophen  7.5-650 Mg Tabs (Hydrocodone-acetaminophen) .... Take 1 tablet by mouth three times a day as needed pain 5)  Prednisone 5 Mg Tabs (Prednisone) .... Take1/2 by mouth once daily or as directed 6)  Voltaren 1 % Gel (Diclofenac sodium) .... Apply two times a day to shoulder as needed pain 7)  Fluticasone Propionate 50 Mcg/act Susp (Fluticasone propionate) .... 2 sprays each nostril once daily 8)  Ferrous Sulfate 325 (65 Fe) Mg Tabs (Ferrous sulfate) .... Take 1 tablet by mouth two times a day 9)  Ciprofloxacin Hcl 500 Mg Tabs (Ciprofloxacin hcl) .Marland Kitchen.. 1 by mouth two times a day for 13 days 10)  Metronidazole 500 Mg Tabs (Metronidazole) .Marland Kitchen.. 1 by mouth three times a day for 13 days 11)  Metoprolol Succinate 25 Mg Tb24 (Metoprolol succinate) .... Two times a day 12)  Zofran 4 Mg Tabs (Ondansetron hcl) .Marland Kitchen.. 1 by mouth every 8 hours as needed for nausea Prescriptions: FERROUS SULFATE 325 (65 FE) MG TABS (FERROUS SULFATE) Take 1 tablet by mouth two times a day  #180 x 3   Entered and Authorized by:   Birdie Sons MD   Signed  by:   Birdie Sons MD on 05/07/2009   Method used:   [facility]        [id number]       [address]       [place], [place]  [address]       Ph: [phone]       Fax: [phone]   RxID:   [id number] METOPROLOL SUCCINATE 25 MG TB24 (METOPROLOL SUCCINATE) two times a day  #180 x 3   Entered and Authorized by:   Birdie Sons MD   Signed by:   Birdie Sons MD on 05/07/2009   Method used:   [facility] to        [id number]       [address]       [place], [place]  [address]       Ph: [phone]       Fax: [phone]   RxID:   [id number]

## 2010-05-06 NOTE — Progress Notes (Signed)
Summary: Record Request   Phone Note From Other Clinic   Caller: Nurse Summary of Call: Meriam Sprague from [facility] called to request the stress test report be faxed. I faxed the report to [phone]. Initial call taken by: Dixie Dials

## 2010-05-06 NOTE — Letter (Signed)
Summary: [facility]   Imported By: Maryln Gottron 04/25/2009 11:03:04  _____________________________________________________________________  External Attachment:    Type:   Image     Comment:   External Document

## 2010-05-06 NOTE — Progress Notes (Signed)
Summary: question about hydrocodone  Phone Note Call from Patient Call back at Home Phone [phone]   Caller: Patient Call For: swords Summary of Call: pt called asking about hydrocodone refill.  She said she never stopped taking it even though we show she d/c in 2008.  She said she hurts all over and wants to know if Dr Cato Mulligan will refill it. She is getting ready to leave town Initial call taken by: Alfred Levins, CMA,  March 04, 2010 3:50 PM  Follow-up for Phone Call        Vicodin 5/325 one p.o. q. day p.r.n. not to exceed 5 per week. #25 with 6 refills. Follow-up by: Birdie Sons MD,  March 05, 2010 10:22 AM  Additional Follow-up for Phone Call Additional follow up Details #1::        Rx Called In Additional Follow-up by: Alfred Levins, CMA,  March 05, 2010 11:06 AM    New/Updated Medications: HYDROCODONE-ACETAMINOPHEN 5-325 MG TABS (HYDROCODONE-ACETAMINOPHEN) 1 by mouth once daily as needed not to exceed 5 per week Prescriptions: HYDROCODONE-ACETAMINOPHEN 5-325 MG TABS (HYDROCODONE-ACETAMINOPHEN) 1 by mouth once daily as needed not to exceed 5 per week  #25 x 5   Entered by:   Alfred Levins, CMA   Authorized by:   Birdie Sons MD   Signed by:   Alfred Levins, CMA on 03/05/2010   Method used:   [facility] ...       [facility]  [address] [id number]       [address]       [place], [place]  [address]       Ph: [phone]       Fax: [phone]   RxID:   [id number]

## 2010-05-06 NOTE — Progress Notes (Signed)
Summary: nausea continues - fu to on call note  Phone Note Outgoing Call   Call placed by: Triage Call placed to: Patient Summary of Call: FU to on call.  Talked to patient this am who asked to get appointments with Dr. Juanda Chance per Dr. Derrell Lolling.  She is still having nausea.  Will make be here tomorrow to get her repeat labs.  Initial call taken by: Rudy Jew, RN,  April 22, 2009 2:01 PM  Follow-up for Phone Call        Already taken care of under append of lab work. Follow-up by: Gladis Riffle, RN,  April 22, 2009 2:15 PM

## 2010-05-06 NOTE — Progress Notes (Signed)
Summary: CPAP  Phone Note Other Incoming Call back at [phone]   Caller: Vernia Buff, [facility] resp team--live Summary of Call: Pt was referred for CPAP.  Pt refuses. Initial call taken by: Gladis Riffle, RN,  April 30, 2009 4:06 PM  Follow-up for Phone Call        Cpap as ordered. if refused please document Follow-up by: Birdie Sons MD,  April 30, 2009 7:10 PM  Additional Follow-up for Phone Call Additional follow up Details #1::        returning a call.  call [phone] Additional Follow-up by: Warnell Forester,  May 01, 2009 9:39 AM    Additional Follow-up for Phone Call Additional follow up Details #2::    states was unable to use mask in hospital.  Encouraged her to try and she is willing to try again.Gladis Riffle, RN  May 01, 2009 11:10 AM   Left message on voice mail of Vernia Buff, [facility] respiratory team to try again with CPAP. Follow-up by: Gladis Riffle, RN,  May 01, 2009 12:06 PM

## 2010-05-06 NOTE — Progress Notes (Signed)
  Phone Note Call from Patient   Summary of Call: call for mammogram results. Initial call taken by: Lynann Beaver CMA AAMA,  January 24, 2010 12:09 PM  Follow-up for Phone Call        okay to give result Follow-up by: Birdie Sons MD,  January 25, 2010 5:08 PM  Additional Follow-up for Phone Call Additional follow up Details #1::        Left message on pt's personal voice mail regarding mammagram results. Additional Follow-up by: Lynann Beaver CMA AAMA,  January 27, 2010 10:32 AM

## 2010-05-06 NOTE — Progress Notes (Signed)
Summary: labs requested  Phone Note Call from Patient   Call For: Birdie Sons MD Reason for Call: Lab or Test Results Details for Reason: lab tests Summary of Call: [facility] nurse - Fleet Contras is calling because patient has been discharged from the hospital and will need a follow up appointment with Dr Cato Mulligan.  She would like to know if it possible for the patient to come in for labs BMET / CBC before coming in to the office for follow up.   Her cell number is [phone] or [facility] [phone]. Initial call taken by: Kern Reap CMA Duncan Dull),  May 31, 2009 2:58 PM  Follow-up for Phone Call        May do labs prior to office visit Follow-up by: Birdie Sons MD,  June 03, 2009 7:00 AM  Additional Follow-up for Phone Call Additional follow up Details #1::        Fleet Contras at [facility] will draw BMET and CBC when she visits pt before her ov. Additional Follow-up by: Gladis Riffle, RN,  June 03, 2009 12:08 PM

## 2010-05-06 NOTE — Progress Notes (Signed)
Summary: burnin on urination  Phone Note Call from Patient Call back at Home Phone [phone]   Caller: Patient via voice mail Call For: Birdie Sons MD Summary of Call: c/o burning on urination since hospital discharge (states had catheter in hospital) Initial call taken by: Gladis Riffle, RN,  April 10, 2009 10:32 AM  Follow-up for Phone Call        sounds like needs ov with someone Follow-up by: Gladis Riffle, RN,  April 10, 2009 10:32 AM  Additional Follow-up for Phone Call Additional follow up Details #1::        appt scheduled with Dr. Caryl Never. Additional Follow-up by: Lynann Beaver CMA,  April 10, 2009 11:02 AM

## 2010-05-06 NOTE — Progress Notes (Signed)
Summary: TRIAGE   Phone Note Call from Patient Call back at [phone]   Caller: Patients Daughter, Dewayne Hatch Call For: Dr Juanda Chance Reason for Call: Acute Illness, Talk to Nurse Details for Reason: Triage Summary of Call: Pt's daughter called and stated Dr Cato Mulligan wants patient seen by our office; Recent hospilization for diverticulitis; Daughter believes they stated "fistula" but cannot be sure. Feels patient cannot wait until next available appt (March 1st) Initial call taken by: Dwan Bolt,  April 22, 2009 2:26 PM  Follow-up for Phone Call        Message left for patient's daughter,Ann, to callback. Laureen Ochs LPN  April 22, 2009 2:43 PM   Pt. daughter states Dr.Swords wants pt. to be seen. Daughter states pt. has feces in her urine. She was in [facility] in December. Saw Dr.Swords on Friday, was told they scheduled pt. for an appt., no notes or appt. in the computer. I offered pt. an appt. this week with the PA, pt. daughter declined, wants to see what Dr.Swords wants to do, I will wait for a call back.    Follow-up by: Laureen Ochs LPN,  April 22, 2009 3:18 PM  Additional Follow-up for Phone Call Additional follow up Details #1::        Pt. daughter will bring pt. to see [facility] on 04-23-09 at 2:30pm. Additional Follow-up by: Laureen Ochs LPN,  April 22, 2009 3:24 PM

## 2010-05-06 NOTE — Progress Notes (Signed)
Summary: request med change  Phone Note Call from Patient Call back at Home Phone [phone]   Caller: Patient via VM Call For: Birdie Sons MD Summary of Call: Requests refill alprazolam to [facility] dr.  Dizziness improved and lorazepam "drives me crazy".  Call when done please. Initial call taken by: Gladis Riffle, RN,  September 20, 2009 8:27 AM  Follow-up for Phone Call        ok per dr Levina Boyack to go back to alprazolam 1mg  Take 1 tablet by mouth at bedtime as needed #30/3 .  see Rx.Patient notified.  Follow-up by: Gladis Riffle, RN,  September 20, 2009 10:19 AM    New/Updated Medications: ALPRAZOLAM 1 MG TABS (ALPRAZOLAM) Take 1 tablet by mouth at bedtime as needed Prescriptions: ALPRAZOLAM 1 MG TABS (ALPRAZOLAM) Take 1 tablet by mouth at bedtime as needed  #30 x 3   Entered by:   Gladis Riffle, RN   Authorized by:   Birdie Sons MD   Signed by:   Gladis Riffle, RN on 09/20/2009   Method used:   Telephoned to ...       [facility]  [id number]       [address]       [place], [place]  [address]       Ph: [phone]       Fax: [phone]   RxID:   [id number]

## 2010-05-06 NOTE — Assessment & Plan Note (Signed)
Summary: PER PT. DAUGHTER-FECES IN PT. URINE/POST HOSPITAL   (DR.BRODI...    History of Present Illness Visit Type: Follow-up Visit Primary GI MD: Lina Sar MD Primary Provider: Birdie Sons MD Requesting Provider: n/a Chief Complaint: Post hospital follow up from 12/30 History of Present Illness:   77 YO FEMALE KNOWN TO DR BRODIE WHO HAS SEVERE DIVERTICULAR DISEASE. LAST COLONOSCOPY 2004 SHOWED EXTENSIVE DIVERTICULAR DISEASE IN THE DESCENDING COLON, AND INTERNAL HEMORRHOIDS. SHE HAS HAD A FEW EPISODES OF DIVERTICULITIS BUT HAS NOT REQUIRED HOSPITALIZATION UNTIL DEC.2010 WHEN SHE WAS ADMITTED BY DR Derrell Lolling . SHE HAD DIVERTICULITIS WITH AN ABSCESS BETWEEN THE BLADDER AND VAGINAL CUFF. THIS WAS DRAINED PER I.R. SHE IMPROVED AND WAS DISCHARGED ON CIPRO AND FLAGYL. APPARENTLY DR Derrell Lolling HAD WANTED GI FOLLOW UP TO CONSIDER COLONOSCOPY AND BE,THEN ELECTIVE RESECTION. THE PAT REPORTS ONSET OF DARK STREAKS IN HER URINE ABOUT A WEEK AFTER DISCHARGE,THIS HAS INCREASED AND HER DAUGHTER SAYS THE URINE HAS MUCOID MATERIAL THAT LOOKS LIKE STOOL IN IT. NO FEVER,SWEATS. SHE IS STILL ON CIPRO AND FLAGYL,HAS BEEN SEEN IN PRIMARY CARE  04/10/09 DX WITH UTI. URINE CULTURE GREW MRSA-SHE IS ON SEPTRA DS.SHE DENIES ANY CURRENT DYSURIA. NO AIR IN THE URINE, BUT VERY ABNORMAL LOOKING URINE WITH BROWN STRAKS EACH TIME SHE VOIDS, DENIES VAGINAL DISCHARGE.   GI Review of Systems    Reports abdominal pain.     Location of  Abdominal pain: LLQ.    Denies acid reflux, belching, bloating, chest pain, dysphagia with liquids, dysphagia with solids, heartburn, loss of appetite, nausea, vomiting, vomiting blood, and  weight loss.      Reports change in bowel habits and  diverticulosis.     Denies anal fissure, black tarry stools, diarrhea, fecal incontinence, heme positive stool, hemorrhoids, irritable bowel syndrome, jaundice, light color stool, liver problems, rectal bleeding, and  rectal pain.    Current Medications  (verified): 1)  Alprazolam 0.5 Mg Tabs (Alprazolam) .... Take 1 Tablet By Mouth Once A Day-- Each Rx To Last 30 Days 2)  Aspir-81 81 Mg Tbec (Aspirin) .... Take 1 Tablet By Mouth Once A Day 3)  Lisinopril 20 Mg Tabs (Lisinopril) .... Take 1 Tablet By Mouth Once A Day 4)  Zegerid 40-1100 Mg Caps (Omeprazole-Sodium Bicarbonate) .... Take 1 Capsule By Mouth Once A Day 5)  Hydrocodone-Acetaminophen 7.5-650 Mg Tabs (Hydrocodone-Acetaminophen) .... Take 1 Tablet By Mouth Three Times A Day As Needed Pain 6)  Benicar 40 Mg  Tabs (Olmesartan Medoxomil) .... Take 1 Tablet By Mouth Once A Day 7)  Prednisone 5 Mg Tabs (Prednisone) .... Take 3 By Mouth Once Daily or As Directed 8)  Voltaren 1 % Gel (Diclofenac Sodium) .... Apply Two Times A Day To Shoulder As Needed Pain 9)  Fluticasone Propionate 50 Mcg/act  Susp (Fluticasone Propionate) .... 2 Sprays Each Nostril Once Daily 10)  Amlodipine Besylate 5 Mg Tabs (Amlodipine Besylate) .... Take 1 Tablet By Mouth Once A Day 11)  Ferrous Sulfate 325 (65 Fe) Mg Tabs (Ferrous Sulfate) .... Take 1 Tablet By Mouth Two Times A Day  Allergies (verified): 1)  ! Hydroxyzine Hcl (Hydroxyzine Hcl)  Past History:  Family History: Last updated: 01/04/2008 Family History of CAD Female 1st degree relative <50 Family History of Colon CA 1st degree relative <60: Son X 2 Family History Diabetes 1st degree relative-daughter Family History of Stroke M 1st degree relative <50 Family History of Cardiovascular disorder: Father  Social History: Last updated: 01/04/2008 Retired Single Current Smoker Alcohol Use -  no Illicit Drug Use - no  Past Medical History: Hyperglycemic Polymyalgia Rheumatoid COPD Diabetes mellitus, type II GERD Anxiety Diverticulitis with MICRO perforation 12/10/ABSCESS  Review of Systems       The patient complains of blood in urine and fatigue.  The patient denies allergy/sinus, anemia, anxiety-new, arthritis/joint pain, back pain, breast  changes/lumps, change in vision, confusion, coughing up blood, fever, heart murmur, heart rhythm changes, itching, menstrual pain, muscle pains/cramps, night sweats, nosebleeds, shortness of breath, skin rash, sleeping problems, sore throat, swelling of feet/legs, swollen lymph glands, thirst - excessive, urination - excessive, urination changes/pain, urine leakage, vision changes, and voice change.         ROS OTHERWISE AS IN HPI  Vital Signs:  Patient profile:   77 year old female Height:      64 inches Weight:      148 pounds BMI:     25.50 BSA:     1.72 Pulse rate:   58 / minute Pulse rhythm:   regular BP sitting:   100 / 62  (left arm)  Vitals Entered By: Merri Ray CMA Duncan Dull) (April 23, 2009 2:15 PM)  Physical Exam  General:  Well developed, well nourished, no acute distress. Head:  Normocephalic and atraumatic. Eyes:  PERRLA, no icterus. Lungs:  Clear throughout to auscultation. Heart:  Regular rate and rhythm; no murmurs, rubs,  or bruits. Abdomen:  SOFT, BS HYPERACTIVE, NO PALP MASS OR HSM, MILD TENDERNESS SUPRAPUBIC Rectal:  HEME NEGATIVE STOOL Extremities:  No clubbing, cyanosis, edema or deformities noted. Neurologic:  Alert and  oriented x4;  grossly normal neurologically. Psych:  Alert and cooperative. Normal mood and affect.   Impression & Recommendations:  Problem # 1:  DIVERTICULITIS OF COLON (ICD-562.11) Assessment New 77 YO FEMALE WITH ADMISSION 03/30/09 WITH ACUTE DIVERTICULITIS WITH ABSCESS BETWEEN BLADDER AND VAGINAL CUFF PPERCUTANEOUSLY DRAINED. 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  CONTINUE CURRENT ANTIBIOTICS FOR NOW CBC,UA AND REPEAT CULTURE TODAY SCHEDULE FOR CT OF THE PELVIS WITH RECTAL CONTRAST ASA P APPOINTMENT WITH DR Derrell Lolling LATER THIS WEEK IF POSSIBLE. Orders: TLB-CBC Platelet - w/Differential ([id number]-[id number]) TLB-Udip w/ Micro ([id number])  Problem # 2:  SLEEP APNEA (ICD-780.57) Assessment: Comment Only  Problem #  3:  CAD (ICD-414.00) Assessment: Comment Only  Problem # 4:  HYPERTENSION (ICD-401.9) Assessment: Comment Only  Problem # 5:  DIABETES MELLITUS, TYPE II (ICD-250.00) Assessment: Comment Only  Problem # 6:  COPD (ICD-496) Assessment: Comment Only  Problem # 7:  Family Hx of COLON CANCER (ICD-153.9) Assessment: Unchanged LAST COLON 2004,WILL ADDRESS PENDING CT FINDINGS  Other Orders: T-CT Pelvis w/ Dye ([id number])  Patient Instructions: 1)  Please go to the lab today, basement level. 2)  We scheduled the CT of the Pelvis for tomorrow 04-24-09 at 2:00 PM.  3)  Contrast and instrucitons provided. 4)  You do have an appointment scheduled with Dr. Derrell Lolling at [facility] scheduled for 04-22-09 at 9:40Am.  5)  Copy sent to : Birdie Sons, MD 6)  The medication list was reviewed and reconciled.  All changed / newly prescribed medications were explained.  A complete medication list was provided to the patient / caregiver.

## 2010-05-06 NOTE — Miscellaneous (Signed)
Summary: Living Will and Health Care Power of Attorney  Living Will and Health Care Power of Attorney   Imported By: Maryln Gottron 05/03/2009 13:43:11  _____________________________________________________________________  External Attachment:    Type:   Image     Comment:   External Document

## 2010-05-06 NOTE — Miscellaneous (Signed)
Summary: Physician's Orders/[facility]  Physician's Orders/[facility]   Imported By: Maryln Gottron 06/14/2009 12:40:47  _____________________________________________________________________  External Attachment:    Type:   Image     Comment:   External Document

## 2010-05-06 NOTE — Progress Notes (Signed)
Summary: list of issues from son  Phone Note Call from Patient Call back at [phone]   Caller: Son steve via vm Call For: Birdie Sons MD Summary of Call: Mother has been hospitalized with diverticulitis and recently discharged.  Please call to discuss her health and issues.  Would like call today. Initial call taken by: Gladis Riffle, RN,  April 15, 2009 12:17 PM  Follow-up for Phone Call        Patient also called and left message saying  her son will be calling and it is ok to discuss her with him. Follow-up by: Gladis Riffle, RN,  April 15, 2009 12:18 PM  Additional Follow-up for Phone Call Additional follow up Details #1::        Pts son Brett Canales) called to speak with Dr Cato Mulligan (OOO).... Adv that he would like Dr Cato Mulligan to call him @ [phone]. Additional Follow-up by: Debbra Riding,  April 17, 2009 12:40 PM    Additional Follow-up for Phone Call Additional follow up Details #2::    sonhas several concerns:  1. being treated for UTI, but discharge of" black or gray" getting worse                          2. has depression that compunds physical problems--would like this addressed more agressively                          3.  not feeling well and no energy since out of hospital for diverticulitis 04/03/09                          4.  stumbles at home now                          5. sees surgeon 1/27 to determine if fit for colonoscopy and/or barium enema for possible surgery   Does have appt here tomorrow and daughter will be with her.  He would like all this addressed.  Was told to have concerns prioritized as is only a 15 min appt.  Depression and blac drainage concerns of his with no energy. Follow-up by: Gladis Riffle, RN,  April 18, 2009 12:29 PM

## 2010-05-06 NOTE — Letter (Signed)
Summary: [facility]  [facility]   Imported By: Maryln Gottron 07/03/2009 12:50:33  _____________________________________________________________________  External Attachment:    Type:   Image     Comment:   External Document

## 2010-05-06 NOTE — Assessment & Plan Note (Signed)
Summary: Cardiology Nuclear Study  Nuclear Med Background Indications for Stress Test: Evaluation for Ischemia, Surgical Clearance  Indications Comments: Pending:Recto-vaginal fistula surgery on 05/14/09 by Dr. Claud Kelp  History: COPD, CT/MRI, Echo, Heart Catheterization, Myocardial Perfusion Study  History Comments: '97 Cath:n/o CAD, EF=65%; '09 ZOX:WRUEAV, EF=83%; 12/10 Echo:EF=55-65%; 12/10 Chest CT:no PE, intramural aortic thrombus-stable per CVTS.  Symptoms: DOE, Fatigue  Symptoms Comments: Anxiety   Nuclear Pre-Procedure Cardiac Risk Factors: Family History - CAD, Hypertension, Lipids, NIDDM, PVD, Smoker Caffeine/Decaff Intake: none NPO After: 7:00 AM Lungs: Clear.  O2 Sat 98% on RA. IV 0.9% NS with Angio Cath: 20g     IV Site: (R) AC IV Started by: Stanton Kidney EMT-P Chest Size (in) 40     Cup Size D     Height (in): 63.5 Weight (lb): 144 BMI: 25.20  Nuclear Med Study 1 or 2 day study:  1 day     Stress Test Type:  Eugenie Birks Reading MD:  Dietrich Pates, MD     Referring MD:  Birdie Sons, MD Resting Radionuclide:  Technetium 63m Tetrofosmin     Resting Radionuclide Dose:  11.0 mCi  Stress Radionuclide:  Technetium 54m Tetrofosmin     Stress Radionuclide Dose:  33.0 mCi   Stress Protocol   Lexiscan: 0.4 mg   Stress Test Technologist:  Rea College CMA-N     Nuclear Technologist:  Burna Mortimer Deal RT-N  Rest Procedure  Myocardial perfusion imaging was performed at rest 45 minutes following the intravenous administration of Myoview Technetium 18m Tetrofosmin.  Stress Procedure  The patient received IV Lexiscan 0.4 mg over 15-seconds.  Myoview injected at 30-seconds.  There were no significant changes with infusion.  Quantitative spect images were obtained after a 45 minute delay.  QPS Raw Data Images:  Normal; no motion artifact; normal heart/lung ratio. Stress Images:  There is normal uptake in all areas. Rest Images:  Normal homogeneous uptake in all areas of the  myocardium. Subtraction (SDS):  Normal Transient Ischemic Dilatation:  1.13  (Normal <1.22)  Lung/Heart Ratio:  .32  (Normal <0.45)  Quantitative Gated Spect Images QGS EDV:  62 ml QGS ESV:  15 ml QGS EF:  75 % QGS cine images:  Normal  Findings Normal nuclear study      Overall Impression  Exercise Capacity: Lexiscan study with no exercise. ECG Impression: Baseline: NSR; No significant ST segment change with Lexiscan. Overall Impression: Normal stress nuclear study.

## 2010-05-06 NOTE — Progress Notes (Signed)
Summary: elevated BP  Phone Note Other Incoming   [facility]: [facility] Summary of Call: Pt had abdominal surgery by dr Derrell Lolling and is homw now.  [facility] seeing her to monitor incision and foly.  BP 200/83 today   Asking if you want anything added to metoprolol 25mg  two times a day.  can call daughter, Dewayne Hatch, at [phone] Initial call taken by: Gladis Riffle, RN,  May 21, 2009 11:10 AM  Follow-up for Phone Call        per dr swords add back amlodipine 5mg  once daily . Follow-up by: Gladis Riffle, RN,  May 21, 2009 11:27 AM  Additional Follow-up for Phone Call Additional follow up Details #1::        Daughter, ann, notified.   see Rx Additional Follow-up by: Gladis Riffle, RN,  May 21, 2009 11:30 AM    New/Updated Medications: AMLODIPINE BESYLATE 5 MG TABS (AMLODIPINE BESYLATE) Take 1 tablet by mouth once a day Prescriptions: AMLODIPINE BESYLATE 5 MG TABS (AMLODIPINE BESYLATE) Take 1 tablet by mouth once a day  #30 x 3   Entered by:   Gladis Riffle, RN   Authorized by:   Birdie Sons MD   Signed by:   Gladis Riffle, RN on 05/21/2009   Method used:   [facility]        [id number]       [address]       [place], [place]  [address]       Ph: [phone]       Fax: [phone]   RxID:   [id number]

## 2010-05-09 NOTE — Letter (Signed)
Summary: [facility]  [facility]   Imported By: Maryln Gottron 06/18/2009 12:17:10  _____________________________________________________________________  External Attachment:    Type:   Image     Comment:   External Document

## 2010-05-09 NOTE — Letter (Signed)
Summary: Request for Medical Clearance/[facility]  Request for Medical Clearance/[facility]   Imported By: Maryln Gottron 05/06/2009 10:08:01  _____________________________________________________________________  External Attachment:    Type:   Image     Comment:   External Document

## 2010-05-09 NOTE — Letter (Signed)
Summary: [facility]  [facility]   Imported By: Maryln Gottron 05/09/2009 14:32:59  _____________________________________________________________________  External Attachment:    Type:   Image     Comment:   External Document

## 2010-05-11 ENCOUNTER — Other Ambulatory Visit: Payer: Self-pay | Admitting: Internal Medicine

## 2010-05-11 DIAGNOSIS — I10 Essential (primary) hypertension: Secondary | ICD-10-CM

## 2010-05-13 ENCOUNTER — Encounter: Payer: Self-pay | Admitting: Internal Medicine

## 2010-05-14 ENCOUNTER — Ambulatory Visit (INDEPENDENT_AMBULATORY_CARE_PROVIDER_SITE_OTHER): Payer: PRIVATE HEALTH INSURANCE | Admitting: Internal Medicine

## 2010-05-14 ENCOUNTER — Encounter: Payer: Self-pay | Admitting: Internal Medicine

## 2010-05-14 DIAGNOSIS — E785 Hyperlipidemia, unspecified: Secondary | ICD-10-CM

## 2010-05-14 DIAGNOSIS — M353 Polymyalgia rheumatica: Secondary | ICD-10-CM

## 2010-05-14 DIAGNOSIS — E119 Type 2 diabetes mellitus without complications: Secondary | ICD-10-CM

## 2010-05-14 DIAGNOSIS — J449 Chronic obstructive pulmonary disease, unspecified: Secondary | ICD-10-CM

## 2010-05-14 DIAGNOSIS — G4730 Sleep apnea, unspecified: Secondary | ICD-10-CM

## 2010-05-14 DIAGNOSIS — I2510 Atherosclerotic heart disease of native coronary artery without angina pectoris: Secondary | ICD-10-CM

## 2010-05-14 DIAGNOSIS — I10 Essential (primary) hypertension: Secondary | ICD-10-CM

## 2010-05-14 DIAGNOSIS — J4489 Other specified chronic obstructive pulmonary disease: Secondary | ICD-10-CM

## 2010-05-14 LAB — HEPATIC FUNCTION PANEL
Albumin: 4.1 g/dL (ref 3.5–5.2)
Alkaline Phosphatase: 57 U/L (ref 39–117)
Total Protein: 7.2 g/dL (ref 6.0–8.3)

## 2010-05-14 LAB — BASIC METABOLIC PANEL
BUN: 8 mg/dL (ref 6–23)
Chloride: 100 mEq/L (ref 96–112)
GFR: 61.76 mL/min (ref >60.00)
Potassium: 4.8 mEq/L (ref 3.5–5.1)
Sodium: 140 mEq/L (ref 135–145)

## 2010-05-14 LAB — LIPID PANEL
Cholesterol: 199 mg/dL (ref 0–200)
HDL: 31.4 mg/dL — ABNORMAL LOW (ref >39.00)
Total CHOL/HDL Ratio: 6
Triglycerides: 390 mg/dL — ABNORMAL HIGH (ref 0.0–149.0)
VLDL: 78 mg/dL — ABNORMAL HIGH (ref 0.0–40.0)

## 2010-05-14 LAB — HEMOGLOBIN A1C: Hgb A1c MFr Bld: 6.5 % (ref 4.6–6.5)

## 2010-05-14 MED ORDER — PREDNISONE 5 MG PO TABS: ORAL_TABLET | ORAL | Status: DC

## 2010-05-14 NOTE — Assessment & Plan Note (Signed)
Unable to tolerate CPAP 

## 2010-05-14 NOTE — Assessment & Plan Note (Signed)
Well controlled Continue meds 

## 2010-05-14 NOTE — Assessment & Plan Note (Signed)
Needs f/u Check labs today 

## 2010-05-14 NOTE — Assessment & Plan Note (Signed)
She continues to be on prednisone but states that she Hurts all over Currently on 2.5 mg prednsione qd.  Will increased to 10 mg po qd .

## 2010-05-14 NOTE — Progress Notes (Signed)
  Subjective:    Patient ID: [name] Cunningham, female    DOB: 12/17/1928, 77 y.o.   MRN: [id number]  HPI   patient comes in for followup of multiple medical problems. She has polymyalgia rheumatica. Has been tapering dose of prednisone. Some of her symptoms they have come back. She does complain of ongoing muscle aches and chronic pains. She thinks this is in addition to a chronic back pain problem and she has which is likely more mechanical. In addition she has hypertension. Home blood pressures have been checked. She has diabetes mellitus tolerating medications without difficulty but no home blood sugars. Patient with coronary artery disease she denies chest pain, shortness of breath or PND.  Review of systems patient denies any chest pain, shortness breath, PND, abdominal pain. She feels recovered after surgery for diverticulitis and then a subsequent colovaginal fistula repair.  she has no other specific complaints and a complete review of systems. Review of Systems     Objective:   Physical Exam      patient as well but in no acute distress. HEENT exam atraumatic, normocephalic neck supple. Chest clear to auscultation cardiac exam S1-S2 irregular. Abdomen cerebrovascular, soft rectum is no edema. Neurologic exam she is alert with a normal gait.   Assessment & Plan:

## 2010-05-14 NOTE — Assessment & Plan Note (Signed)
Check labs today.

## 2010-05-14 NOTE — Assessment & Plan Note (Signed)
Continues to smoke Advised absolute cessation

## 2010-05-14 NOTE — Assessment & Plan Note (Signed)
No sxs Continue current meds 

## 2010-05-15 ENCOUNTER — Other Ambulatory Visit: Payer: Self-pay | Admitting: *Deleted

## 2010-05-15 ENCOUNTER — Telehealth: Payer: Self-pay | Admitting: Internal Medicine

## 2010-05-15 MED ORDER — HYDROCODONE-ACETAMINOPHEN 5-325 MG PO TABS: ORAL_TABLET | ORAL | Status: DC

## 2010-05-15 NOTE — Telephone Encounter (Signed)
Called in 1 po every 6 hours prn for pain #30 with 3 refills per verbal Dr Cato Mulligan

## 2010-05-15 NOTE — Telephone Encounter (Signed)
Pharmacy has 2 different rx's at the pharmacy for her hydrocodone.  One reads 1 po every 6 hours prn and the other reads 1 po qd not to exceed 5 per week.  Which one is correct?

## 2010-05-15 NOTE — Telephone Encounter (Signed)
Refill the one for once a day. #30, 3 refills.

## 2010-05-19 ENCOUNTER — Other Ambulatory Visit: Payer: Self-pay | Admitting: Internal Medicine

## 2010-05-30 ENCOUNTER — Inpatient Hospital Stay (HOSPITAL_COMMUNITY)
Admission: EM | Admit: 2010-05-30 | Discharge: 2010-06-02 | DRG: 309 | Disposition: A | Discharge status: Home / Self Care | Payer: Medicare Other | Attending: Internal Medicine | Admitting: Internal Medicine

## 2010-05-30 DIAGNOSIS — R112 Nausea with vomiting, unspecified: Secondary | ICD-10-CM | POA: Diagnosis present

## 2010-05-30 DIAGNOSIS — G4733 Obstructive sleep apnea (adult) (pediatric): Secondary | ICD-10-CM | POA: Diagnosis present

## 2010-05-30 DIAGNOSIS — M353 Polymyalgia rheumatica: Secondary | ICD-10-CM | POA: Diagnosis present

## 2010-05-30 DIAGNOSIS — Z79899 Other long term (current) drug therapy: Secondary | ICD-10-CM

## 2010-05-30 DIAGNOSIS — E876 Hypokalemia: Secondary | ICD-10-CM | POA: Diagnosis not present

## 2010-05-30 DIAGNOSIS — N390 Urinary tract infection, site not specified: Secondary | ICD-10-CM | POA: Diagnosis present

## 2010-05-30 DIAGNOSIS — T448X5A Adverse effect of centrally-acting and adrenergic-neuron-blocking agents, initial encounter: Secondary | ICD-10-CM | POA: Diagnosis present

## 2010-05-30 DIAGNOSIS — F17200 Nicotine dependence, unspecified, uncomplicated: Secondary | ICD-10-CM | POA: Diagnosis present

## 2010-05-30 DIAGNOSIS — Z7982 Long term (current) use of aspirin: Secondary | ICD-10-CM

## 2010-05-30 DIAGNOSIS — IMO0002 Reserved for concepts with insufficient information to code with codable children: Secondary | ICD-10-CM

## 2010-05-30 DIAGNOSIS — I10 Essential (primary) hypertension: Secondary | ICD-10-CM | POA: Diagnosis present

## 2010-05-30 DIAGNOSIS — G459 Transient cerebral ischemic attack, unspecified: Secondary | ICD-10-CM | POA: Diagnosis present

## 2010-05-30 DIAGNOSIS — K219 Gastro-esophageal reflux disease without esophagitis: Secondary | ICD-10-CM | POA: Diagnosis present

## 2010-05-30 DIAGNOSIS — I498 Other specified cardiac arrhythmias: Principal | ICD-10-CM | POA: Diagnosis present

## 2010-05-31 ENCOUNTER — Emergency Department (HOSPITAL_COMMUNITY): Payer: Medicare Other

## 2010-05-31 ENCOUNTER — Inpatient Hospital Stay (HOSPITAL_COMMUNITY): Payer: Medicare Other

## 2010-05-31 LAB — DIFFERENTIAL
Basophils Absolute: 0.1 10*3/uL (ref 0.0–0.1)
Eosinophils Absolute: 0.1 10*3/uL (ref 0.0–0.7)
Eosinophils Relative: 1 % (ref 0–5)
Lymphocytes Relative: 18 % (ref 12–46)
Neutrophils Relative %: 75 % (ref 43–77)

## 2010-05-31 LAB — URINALYSIS, ROUTINE W REFLEX MICROSCOPIC
Bilirubin Urine: NEGATIVE
Hgb urine dipstick: NEGATIVE
Hgb urine dipstick: NEGATIVE
Ketones, ur: NEGATIVE mg/dL
Nitrite: NEGATIVE
Specific Gravity, Urine: 1.006 (ref 1.005–1.030)
Urine Glucose, Fasting: NEGATIVE mg/dL
Urobilinogen, UA: 0.2 mg/dL (ref 0.0–1.0)
pH: 6.5 (ref 5.0–8.0)

## 2010-05-31 LAB — URINE MICROSCOPIC-ADD ON

## 2010-05-31 LAB — COMPREHENSIVE METABOLIC PANEL
Albumin: 3.6 g/dL (ref 3.5–5.2)
Alkaline Phosphatase: 53 U/L (ref 39–117)
BUN: 7 mg/dL (ref 6–23)
Calcium: 9.1 mg/dL (ref 8.4–10.5)
Creatinine, Ser: 0.75 mg/dL (ref 0.4–1.2)
Potassium: 3.6 mEq/L (ref 3.5–5.1)
Total Protein: 6.9 g/dL (ref 6.0–8.3)

## 2010-05-31 LAB — POCT I-STAT, CHEM 8
Glucose, Bld: 150 mg/dL — ABNORMAL HIGH (ref 70–99)
HCT: 41 % (ref 36.0–46.0)
Hemoglobin: 13.9 g/dL (ref 12.0–15.0)
Potassium: 4.2 mEq/L (ref 3.5–5.1)

## 2010-05-31 LAB — CBC
HCT: 37.7 % (ref 36.0–46.0)
MCV: 85 fL (ref 78.0–100.0)
Platelets: 227 10*3/uL (ref 150–400)
Platelets: 234 10*3/uL (ref 150–400)
RBC: 4.47 MIL/uL (ref 3.87–5.11)
RDW: 13.2 % (ref 11.5–15.5)
WBC: 10.1 10*3/uL (ref 4.0–10.5)
WBC: 15.4 10*3/uL — ABNORMAL HIGH (ref 4.0–10.5)

## 2010-05-31 LAB — HEPATIC FUNCTION PANEL
ALT: 14 U/L (ref 0–35)
AST: 22 U/L (ref 0–37)
Albumin: 3.7 g/dL (ref 3.5–5.2)
Total Protein: 6.7 g/dL (ref 6.0–8.3)

## 2010-05-31 LAB — GLUCOSE, CAPILLARY
Glucose-Capillary: 172 mg/dL — ABNORMAL HIGH (ref 70–99)
Glucose-Capillary: 160 mg/dL — ABNORMAL HIGH (ref 70–99)

## 2010-05-31 LAB — CARDIAC PANEL(CRET KIN+CKTOT+MB+TROPI)
CK, MB: 1.6 ng/mL (ref 0.3–4.0)
Relative Index: INVALID (ref 0.0–2.5)
Total CK: 44 U/L (ref 7–177)
Troponin I: 0.01 ng/mL (ref 0.00–0.06)

## 2010-05-31 LAB — LIPID PANEL
HDL: 40 mg/dL (ref >39)
LDL Cholesterol: 123 mg/dL — ABNORMAL HIGH (ref 0–99)
Total CHOL/HDL Ratio: 4.9 RATIO
Triglycerides: 162 mg/dL — ABNORMAL HIGH (ref <150)
VLDL: 32 mg/dL (ref 0–40)

## 2010-05-31 LAB — PROTIME-INR: Prothrombin Time: 13.8 seconds (ref 11.6–15.2)

## 2010-05-31 LAB — HEMOGLOBIN A1C: Mean Plasma Glucose: 137 mg/dL — ABNORMAL HIGH (ref <117)

## 2010-05-31 LAB — BASIC METABOLIC PANEL
GFR calc non Af Amer: >60 mL/min (ref >60)
Potassium: 4.5 mEq/L (ref 3.5–5.1)
Sodium: 133 mEq/L — ABNORMAL LOW (ref 135–145)

## 2010-05-31 LAB — CK TOTAL AND CKMB (NOT AT ARMC): Total CK: 40 U/L (ref 7–177)

## 2010-06-01 LAB — BASIC METABOLIC PANEL
BUN: 5 mg/dL — ABNORMAL LOW (ref 6–23)
CO2: 25 mEq/L (ref 19–32)
Calcium: 9.1 mg/dL (ref 8.4–10.5)
GFR calc non Af Amer: >60 mL/min (ref >60)
Glucose, Bld: 95 mg/dL (ref 70–99)

## 2010-06-01 LAB — URINE CULTURE
Colony Count: NO GROWTH
Culture: NO GROWTH

## 2010-06-01 LAB — CBC
HCT: 36.6 % (ref 36.0–46.0)
MCH: 28.5 pg (ref 26.0–34.0)
MCHC: 33.6 g/dL (ref 30.0–36.0)
MCV: 84.7 fL (ref 78.0–100.0)
RDW: 13.2 % (ref 11.5–15.5)

## 2010-06-01 LAB — GLUCOSE, CAPILLARY

## 2010-06-01 NOTE — H&P (Signed)
NAMEMARKEITA, [name]            ACCOUNT NO.:  [id number]  MEDICAL RECORD NO.:  [id number]           PATIENT TYPE:  E  LOCATION:  [facility]                         FACILITY:  [facility]  PHYSICIAN:  Conley Canal, MD      DATE OF BIRTH:  1928-08-13  DATE OF ADMISSION:  05/30/2010 DATE OF DISCHARGE:                             HISTORY & PHYSICAL   PRIMARY CARE PHYSICIAN:  Valetta Mole. Swords, MD  GENERAL SURGEON:  Angelia Mould. Derrell Lolling, MD  UROLOGISTLynelle Smoke I. Patsi Sears, MD  CHIEF COMPLAINT:  Vomiting and confusion.  HISTORY OF PRESENT ILLNESS:  This 77 year old female with history of polymyalgia rheumatica on low-dose prednisone, obstructive sleep apnea, gastroesophageal reflux disease, hypertension, recent rectosigmoid diverticulitis with complications of colovesical fistula, status post recent exploratory laparotomy with resection of rectosigmoid colon with coloproctostomy and low anterior resection as well as partial cystectomy and repair of colovesical fistula and colovaginal fistula who comes in today with history of vomiting and confusion.  At the time of evaluation, the patient is quite somnolent and is not able to give me any meaningful history, although she is following commands in her sleep. History is hence obtained from emergency room records as well as from recent admission.  Apparently, the patient's daughter found her around 10 a.m. vomiting and confused.  This apparently went on for several hours associated with slow speech and dizziness.  The daughter was concerned that she might have had a stroke.  In the emergency room, the patient had CT of the brain without contrast, which showed no acute intracranial abnormality and some cerebral atrophy and small vessel ischemic change with sinus disease.  An abdominal x-ray showed a nonobstructed bowel gas pattern.  Otherwise, no other history available.  PAST MEDICAL HISTORY:  Polymyalgia rheumatica, tobacco  abuse, gastroesophageal reflux disease, obstructive sleep apnea, hypertension, rectosigmoid diverticulitis, colovesical fistula status post repair.  ALLERGIES:  No known drug allergies.  HOME MEDICATIONS:  Prednisone, iron, amlodipine, Vicodin, alprazolam, promethazine, Lopressor, aspirin.  REVIEW OF SYSTEMS:  Limited due to the patient's somnolence.  SOCIAL HISTORY:  Tobacco habituation.  Lives with her daughter.  No history of alcohol.  FAMILY HISTORY:  Per records had two children died of colon cancer. Mother died of stomach cancer and father died of myocardial infarction.  PHYSICAL EXAMINATION:  GENERAL:  This is an elderly lady who is not in acute distress.  She is lying comfortably in bed. VITALS:  Blood pressure 130/59, heart rate 52, temperature 97.9, respirations 15, oxygen saturation is 94% on 2 liters nasal cannula. HEAD, EARS, NOSE AND THROAT:  Pupils equal reacting to light. RESPIRATORY SYSTEM:  Bilateral air entry with no rhonchi, rales or wheezes CARDIOVASCULAR SYSTEM:  First and second heart sounds heard.  No murmurs.  Pulse regular. ABDOMEN:  Scaphoid, soft, nontender.  No palpable organomegaly.  Old surgical scar.  Bowel sounds appreciated. CNS:  The patient somnolent, follows commands, moving all extremities. No obvious deficit, disoriented in place and time.  EXTREMITIES:  No pedal edema.  Peripheral pulses equal.  LABORATORY DATA:  Labs were reviewed significant for WBC 15.4, hemoglobin 13.2, hematocrit 37.7, platelet count 234.  Sodium 133, potassium 4.5, BUN 10, creatinine 0.85, calcium 9.3, glucose 157, lipase 29.  Urinalysis; nitrites negative, leukocytes positive, wbc 3-6, rbc 0- 2, bacteria rare.  IMAGING STUDIES:  Described above.  EKG shows sinus bradycardia with first degree AV block.  No significant ST-T wave changes.  IMPRESSION:  A 77 year old recently hospitalized for diverticulitis and colovesical fistula, status post exploratory laparotomy  who is presenting with confusions and vomiting.  The differential diagnosis would be a central process including cerebrovascular accident, acute delirium, also possibility of medications as the patient has been on Vicodin, prednisone, alprazolam, promethazine, which could potentially cause confusion.  The etiology of the vomiting not clear at this point. She is not obstructed.  PLAN: 1. Altered mental status.  We will admit the patient to telemetry.     Pursue stroke protocol including MRI, 2-D echocardiogram, carotid     duplex.  Hold psychotropic medications including Vicodin,     alprazolam, promethazine. 2. Vomiting, concerning given recent abdominal surgery.  No evidence     of small-bowel obstruction.  If recurrent vomiting, consider CT     abdomen and pelvis.  The vomiting could be related to UTI.  We will     continue antiemetics and gently rehydrate. 3. UTI.  We will cover with ciprofloxacin.  Follow urine culture     results. 4. Hypertension.  Blood pressure normal.  Continue amlodipine. 5. Polymyalgia rheumatica, seems stable.  Continue low-dose     prednisone. 6. Gastroesophageal reflux disease, PPI. 7. DVT prophylaxis with Lovenox. 8. The patient's condition is guarded.     Conley Canal, MD     SR/MEDQ  D:  05/31/2010  T:  05/31/2010  Job:  [id number]  cc:   Valetta Mole. Swords, MD Angelia Mould. Derrell Lolling, M.D. Sigmund I. Patsi Sears, M.D.  Electronically Signed by Conley Canal  on 06/01/2010 06:20:29 AM

## 2010-06-02 ENCOUNTER — Inpatient Hospital Stay (HOSPITAL_COMMUNITY): Payer: Medicare Other

## 2010-06-02 LAB — BASIC METABOLIC PANEL
CO2: 26 mEq/L (ref 19–32)
Calcium: 9.1 mg/dL (ref 8.4–10.5)
Chloride: 104 mEq/L (ref 96–112)
Creatinine, Ser: 0.77 mg/dL (ref 0.4–1.2)
Glucose, Bld: 90 mg/dL (ref 70–99)
Sodium: 137 mEq/L (ref 135–145)

## 2010-06-02 LAB — GLUCOSE, CAPILLARY
Glucose-Capillary: 105 mg/dL — ABNORMAL HIGH (ref 70–99)
Glucose-Capillary: 111 mg/dL — ABNORMAL HIGH (ref 70–99)

## 2010-06-05 ENCOUNTER — Telehealth: Payer: Self-pay | Admitting: Internal Medicine

## 2010-06-05 NOTE — Telephone Encounter (Signed)
Pts daughter Tommie Ard) called to adv that the pt was seen at [facility] ED and admitted for TIA.... Adv to f/u with PCP within one week.... Pts daughter wants to have pt worked into schedule this coming week if possible... Can you advise.... Pt can be reached at  [phone].

## 2010-06-05 NOTE — Telephone Encounter (Signed)
ok 

## 2010-06-09 NOTE — Telephone Encounter (Signed)
LMTCB SO APPT CAN BE SCHEDULED.... 06/09/10 @ 11:36AM  // [name] Cunningham

## 2010-06-09 NOTE — Telephone Encounter (Signed)
PTS DAUGHTER (ANN) C/B AND SCHEDULED AN APPT FOR 06/11/10....// Horatio Pel

## 2010-06-11 ENCOUNTER — Encounter: Payer: Self-pay | Admitting: Internal Medicine

## 2010-06-11 ENCOUNTER — Ambulatory Visit (INDEPENDENT_AMBULATORY_CARE_PROVIDER_SITE_OTHER): Payer: Medicare Other | Admitting: Internal Medicine

## 2010-06-11 VITALS — BP 132/74 | HR 68 | Temp 97.9°F | Wt 146.0 lb

## 2010-06-11 DIAGNOSIS — M353 Polymyalgia rheumatica: Secondary | ICD-10-CM

## 2010-06-11 DIAGNOSIS — G459 Transient cerebral ischemic attack, unspecified: Secondary | ICD-10-CM

## 2010-06-11 DIAGNOSIS — I2510 Atherosclerotic heart disease of native coronary artery without angina pectoris: Secondary | ICD-10-CM

## 2010-06-11 DIAGNOSIS — I10 Essential (primary) hypertension: Secondary | ICD-10-CM

## 2010-06-11 MED ORDER — FLUTICASONE PROPIONATE 50 MCG/ACT NA SUSP: 2.0000 | Freq: Every day | NASAL | Status: DC

## 2010-06-11 NOTE — Progress Notes (Signed)
  Subjective:    Patient ID: [name] Cunningham, female    DOB: 1929-03-23, 77 y.o.   MRN: [id number]  HPI  Post hospital f/u See h and p and d/c summary. Thought to have TIA. Clinically she presented with n/v and confusion. sxs cleared quickly. She is feeling much better. She was found to be bradycardic, stopped metoprolol Treated for UTI---Cipro in hospital---culture negative.  patient has a history of polymyalgia rheumatica.  She does have some sub-orbital discomfort. Also has some occipital pain in her head. No neurologic symptoms.  Overall she's feeling much better. She is able to do her own activities of daily living. Her appetite is normal.  Past Medical History  Diagnosis Date  . HYPERLIPIDEMIA 01/06/2007  . HYPERTENSION 01/06/2007  . COPD 01/06/2007  . GERD 01/06/2007  . DIABETES MELLITUS, TYPE II 01/17/2007  . CAD 01/04/2008  . PULMONARY EMBOLISM, HX OF 01/04/2008  . ANXIETY 11/21/2008  . SLEEP APNEA 11/21/2008  . DIVERTICULITIS OF COLON 07/23/2009   Past Surgical History  Procedure Date  . Temporal artery biopsy / ligation   . Cataract extraction   . Cholecystectomy   . Abdominal hysterectomy   . Appendectomy   . Partial colectomy     diverticulitis  . Colovaginal fistula repair     after partial colectomy    reports that she has been smoking Cigarettes.  She has been smoking about 1.5 packs per day. She does not have any smokeless tobacco history on file. She reports that she does not drink alcohol or use illicit drugs. family history includes Colon cancer in her son; Diabetes in her daughter; and Heart disease in her father.    Allergies  Allergen Reactions  . Hydroxyzine Hcl     REACTION: Makes feel like does not want to live     Review of Systems  patient denies chest pain, shortness of breath, orthopnea. Denies lower extremity edema, abdominal pain, change in appetite, change in bowel movements. Patient denies rashes, musculoskeletal complaints. No other  specific complaints in a complete review of systems.  Admits to some fatigue    Objective:   Physical Exam  Well-developed well-nourished female in no acute distress. HEENT exam atraumatic, normocephalic, extraocular muscles are intact. Neck is supple. No jugular venous distention no thyromegaly. Chest clear to auscultation without increased work of breathing. Cardiac exam S1 and S2 are regular. Abdominal exam active bowel sounds, soft, nontender. Extremities no edema. Neurologic exam she is alert without any motor sensory deficits. Gait is normal.        Assessment & Plan:

## 2010-06-11 NOTE — Assessment & Plan Note (Signed)
Patient has no chest pain, shortness of breath. I think she is asymptomatic. We'll continue with risk factor modification.

## 2010-06-11 NOTE — Assessment & Plan Note (Signed)
I doubt that the patient's cephalgia  is related to PMR or temporall arteritis. I will check a sedimentation rate today. I plan on seeing the patient back in one month.

## 2010-06-11 NOTE — Assessment & Plan Note (Signed)
Presumptive diagnosis 05/2010 Hospitalized found to be bradycardic---stopped metoprolol  Will continue with aspirin 325 mg daily.   In the hospital she was hypokalemic. This was replaced. I suspect the hypokalemia was secondary to nausea and vomiting. Will stop the potassium. No need to recheck at this time.

## 2010-06-11 NOTE — Assessment & Plan Note (Signed)
Adequate control. Continue current medications. Reviewed medications with the patient and her daughter.

## 2010-06-11 NOTE — Procedures (Signed)
EEG NUMBER:  [id number].  HISTORY:  This is a 77 year old patient with nausea, vomiting, and confusion on admission.  The patient has a history of polymyalgia rheumatica, arthritis, gastroesophageal reflux disease, hypertension, urinary tract infection.  This is a routine EEG.  No skull defects noted.  MEDICATIONS:  Aspirin, Lovenox, prednisone, Lopressor, Norvasc, K-Dur, Protonix, iron, Xanax, and Cipro.  EEG CLASSIFICATION:  Normal, awake.  DESCRIPTION OF RECORDING:  Background rhythm consists of a fairly well- modulated medium amplitude alpha rhythm of 11 Hz that is reactive to eye open and closure.  As the record progresses, the patient appears to remain in awakened state throughout the recording.  Photic stimulation is performed resulting in a bilateral and symmetric photic drive response.  Hyperventilation was also performed and results in a very minimal buildup background rhythm activities without significant slowing seen.  At no time during the recording did there appear to be evidence of spike, spike wave discharges, or evidence of focal slowing.  EKG monitor shows no evidence of cardiac rhythm abnormalities with a heart rate of 56.  IMPRESSION:  This is a normal EEG recording in waking state.  No evidence of ictal or interictal discharges were seen.     Marlan Palau, M.D.    WUJ:WJXB D:  06/02/2010 14:08:48  T:  06/02/2010 21:22:09  Job #:  [id number]

## 2010-06-14 NOTE — Discharge Summary (Signed)
[name] Cunningham, [name] Cunningham            ACCOUNT NO.:  [id number]  MEDICAL RECORD NO.:  [id number]           PATIENT TYPE:  I  LOCATION:  3705                         FACILITY:  [facility]  PHYSICIAN:  Richarda Overlie, MD       DATE OF BIRTH:  [birth year]  DATE OF ADMISSION:  05/30/2010 DATE OF DISCHARGE:  06/02/2010                              DISCHARGE SUMMARY   PRIMARY CARE PHYSICIAN:  Valetta Mole. Swords, MD  DISCHARGE DIAGNOSES: 1. Transient ischemic attack. 2. Bradycardia secondary to beta-blocker. 3. Uncontrolled blood pressure during this admission. 4. Urinary tract infection. 5. Polymyalgia rheumatica. 6. Gastroesophageal reflux disease. 7. Hypomagnesemia. 8. Hypokalemia. 9. Dyslipidemia.  PROCEDURES:  MRI of the brain without contrast shows no acute or reversible abnormality.  Carotid Doppler ultrasound shows 40-50% right ICA stenosis.  Abdominal x-ray shows stable nonobstructive bowel gas pattern.  CT of the head without contrast shows no acute abnormality. Chest x-ray shows no acute abnormality.  Echocardiogram showed an EF of 55-60% without any source of cardiac emboli.  EEG study was done and is pending.  A 77 year old female with a history of polymyalgia rheumatica on low- dose prednisone, obstructive sleep apnea, gastroesophageal reflux disease, and hypertension who presents to the ED with an episode of vomiting and confusion.  The patient was also found to be quite somnolent and not able to give any meaningful history upon presentation to the ED.  Upon admission, the patient was found to have an elevated white count of 15.5 and urinary tract infection.  An EKG showed sinus bradycardia with first degree AV block.  HOSPITAL COURSE: 1. Altered mental status thought to be secondary to possible TIA     versus CVA.  Stroke protocol was pursued and the patient was     thought to have a TIA.  She has been started on a full-dose enteric-     coated aspirin.  She has been asked to  minimize psychotropic     medications, namely Vicodin, promethazine, and alprazolam.  Her     Xanax was held during this admission and restarted on a low-dose     prior to discharge.  The patient has multiple cardiovascular risk     factors including elevated LDL as well as hypertension and smoking     and has been counseled extensively about nicotine cessation.  I     have also adjusted antihypertensive medications.  Her Norvasc has     been increased to 10 mg p.o. daily, metoprolol has been     discontinued because of bradycardia and she has been started on     lisinopril 10 mg p.o. daily. 2. Urinary tract infection.  The patient has been started on     ciprofloxacin and will continue this for another 5 days.  DISCHARGE MEDICATIONS: 1. Aspirin 325 mg p.o. daily. 2. Norvasc 10 mg p.o. daily. 3. Nicotine patch with taper. 4. Potassium 20 mEq p.o. daily. 5. Protonix 40 mg p.o. daily. 6. Xanax 1 mg p.o. at bedtime. 7. Vicodin 5/325 one p.o. q.6 h. p.r.n. 8. Prednisone 5 mg p.o. daily. 9. Voltaren gel 1 application topical. 10.Metoprolol has been  discontinued. 11.Ferrous sulfate 325 mg p.o. b.i.d. 12.Ciprofloxacin 500 mg p.o. daily for 5 days. 13.Lisinopril 10 mg p.o. daily. 14.Magnesium oxide 400 mg p.o. daily.     Richarda Overlie, MD     NA/MEDQ  D:  06/02/2010  T:  06/02/2010  Job:  [id number]  cc:   Valetta Mole. Swords, MD  Electronically Signed by Richarda Overlie MD on 06/14/2010 08:13:43 AM

## 2010-06-25 ENCOUNTER — Ambulatory Visit: Payer: PRIVATE HEALTH INSURANCE | Admitting: Internal Medicine

## 2010-06-25 LAB — COMPREHENSIVE METABOLIC PANEL
ALT: 16 U/L (ref 0–35)
AST: 26 U/L (ref 0–37)
Alkaline Phosphatase: 40 U/L (ref 39–117)
CO2: 27 mEq/L (ref 19–32)
Calcium: 9.6 mg/dL (ref 8.4–10.5)
Chloride: 104 mEq/L (ref 96–112)
GFR calc Af Amer: >60 mL/min (ref >60)
GFR calc non Af Amer: >60 mL/min (ref >60)
Glucose, Bld: 121 mg/dL — ABNORMAL HIGH (ref 70–99)
Sodium: 139 mEq/L (ref 135–145)
Total Bilirubin: 0.7 mg/dL (ref 0.3–1.2)

## 2010-06-25 LAB — GLUCOSE, CAPILLARY
Glucose-Capillary: 100 mg/dL — ABNORMAL HIGH (ref 70–99)
Glucose-Capillary: 104 mg/dL — ABNORMAL HIGH (ref 70–99)
Glucose-Capillary: 104 mg/dL — ABNORMAL HIGH (ref 70–99)
Glucose-Capillary: 110 mg/dL — ABNORMAL HIGH (ref 70–99)
Glucose-Capillary: 114 mg/dL — ABNORMAL HIGH (ref 70–99)
Glucose-Capillary: 119 mg/dL — ABNORMAL HIGH (ref 70–99)
Glucose-Capillary: 127 mg/dL — ABNORMAL HIGH (ref 70–99)
Glucose-Capillary: 132 mg/dL — ABNORMAL HIGH (ref 70–99)
Glucose-Capillary: 136 mg/dL — ABNORMAL HIGH (ref 70–99)
Glucose-Capillary: 141 mg/dL — ABNORMAL HIGH (ref 70–99)
Glucose-Capillary: 144 mg/dL — ABNORMAL HIGH (ref 70–99)
Glucose-Capillary: 180 mg/dL — ABNORMAL HIGH (ref 70–99)
Glucose-Capillary: 75 mg/dL (ref 70–99)
Glucose-Capillary: 94 mg/dL (ref 70–99)

## 2010-06-25 LAB — DIFFERENTIAL
Basophils Absolute: 0.1 10*3/uL (ref 0.0–0.1)
Basophils Relative: 1 % (ref 0–1)
Eosinophils Absolute: 0.1 10*3/uL (ref 0.0–0.7)
Eosinophils Relative: 2 % (ref 0–5)
Neutrophils Relative %: 68 % (ref 43–77)

## 2010-06-25 LAB — CBC
HCT: 22 % — ABNORMAL LOW (ref 36.0–46.0)
Hemoglobin: 12.1 g/dL (ref 12.0–15.0)
MCHC: 34.1 g/dL (ref 30.0–36.0)
MCHC: 34.3 g/dL (ref 30.0–36.0)
MCHC: 34.3 g/dL (ref 30.0–36.0)
MCV: 88.6 fL (ref 78.0–100.0)
Platelets: 176 10*3/uL (ref 150–400)
Platelets: 192 10*3/uL (ref 150–400)
Platelets: 204 10*3/uL (ref 150–400)
RBC: 2.8 MIL/uL — ABNORMAL LOW (ref 3.87–5.11)
RBC: 3.15 MIL/uL — ABNORMAL LOW (ref 3.87–5.11)
RBC: 4.01 MIL/uL (ref 3.87–5.11)
RDW: 14.3 % (ref 11.5–15.5)
RDW: 14.5 % (ref 11.5–15.5)
WBC: 13.4 10*3/uL — ABNORMAL HIGH (ref 4.0–10.5)
WBC: 7.7 10*3/uL (ref 4.0–10.5)
WBC: 8.5 10*3/uL (ref 4.0–10.5)

## 2010-06-25 LAB — TYPE AND SCREEN

## 2010-06-25 LAB — HEMOGLOBIN A1C
Hgb A1c MFr Bld: 6.4 % — ABNORMAL HIGH (ref 4.6–6.1)
Mean Plasma Glucose: 137 mg/dL

## 2010-06-25 LAB — BASIC METABOLIC PANEL
BUN: 6 mg/dL (ref 6–23)
BUN: 8 mg/dL (ref 6–23)
BUN: 9 mg/dL (ref 6–23)
CO2: 23 mEq/L (ref 19–32)
CO2: 23 mEq/L (ref 19–32)
Calcium: 6.9 mg/dL — ABNORMAL LOW (ref 8.4–10.5)
Calcium: 7 mg/dL — ABNORMAL LOW (ref 8.4–10.5)
Calcium: 7 mg/dL — ABNORMAL LOW (ref 8.4–10.5)
Chloride: 103 mEq/L (ref 96–112)
Creatinine, Ser: 0.68 mg/dL (ref 0.4–1.2)
Creatinine, Ser: 0.85 mg/dL (ref 0.4–1.2)
Creatinine, Ser: 1.08 mg/dL (ref 0.4–1.2)
GFR calc Af Amer: 59 mL/min — ABNORMAL LOW (ref >60)
GFR calc Af Amer: >60 mL/min (ref >60)
GFR calc non Af Amer: >60 mL/min (ref >60)
GFR calc non Af Amer: >60 mL/min (ref >60)
GFR calc non Af Amer: >60 mL/min (ref >60)
Glucose, Bld: 122 mg/dL — ABNORMAL HIGH (ref 70–99)
Glucose, Bld: 95 mg/dL (ref 70–99)
Sodium: 130 mEq/L — ABNORMAL LOW (ref 135–145)

## 2010-06-25 LAB — URINALYSIS, ROUTINE W REFLEX MICROSCOPIC
Glucose, UA: NEGATIVE mg/dL
Ketones, ur: NEGATIVE mg/dL
Nitrite: NEGATIVE
Protein, ur: NEGATIVE mg/dL
Urobilinogen, UA: 0.2 mg/dL (ref 0.0–1.0)

## 2010-06-25 LAB — URINE MICROSCOPIC-ADD ON

## 2010-06-25 LAB — HEMOGLOBIN AND HEMATOCRIT, BLOOD: HCT: 31.3 % — ABNORMAL LOW (ref 36.0–46.0)

## 2010-06-25 LAB — MRSA PCR SCREENING: MRSA by PCR: NEGATIVE

## 2010-06-25 LAB — POTASSIUM
Potassium: 2.5 mEq/L — CL (ref 3.5–5.1)
Potassium: 4.5 mEq/L (ref 3.5–5.1)

## 2010-07-02 ENCOUNTER — Other Ambulatory Visit: Payer: Self-pay | Admitting: *Deleted

## 2010-07-02 DIAGNOSIS — I10 Essential (primary) hypertension: Secondary | ICD-10-CM

## 2010-07-02 MED ORDER — LISINOPRIL 10 MG PO TABS: 10.0000 mg | ORAL_TABLET | Freq: Every day | ORAL | Status: DC

## 2010-07-07 LAB — URINE CULTURE: Culture: NO GROWTH

## 2010-07-07 LAB — BASIC METABOLIC PANEL
BUN: 8 mg/dL (ref 6–23)
BUN: 9 mg/dL (ref 6–23)
CO2: 19 mEq/L (ref 19–32)
CO2: 24 mEq/L (ref 19–32)
CO2: 28 mEq/L (ref 19–32)
Calcium: 7.6 mg/dL — ABNORMAL LOW (ref 8.4–10.5)
Calcium: 8.9 mg/dL (ref 8.4–10.5)
Chloride: 100 mEq/L (ref 96–112)
Chloride: 102 mEq/L (ref 96–112)
Chloride: 102 mEq/L (ref 96–112)
Chloride: 95 mEq/L — ABNORMAL LOW (ref 96–112)
Creatinine, Ser: 0.8 mg/dL (ref 0.4–1.2)
Creatinine, Ser: 0.84 mg/dL (ref 0.4–1.2)
Creatinine, Ser: 0.85 mg/dL (ref 0.4–1.2)
GFR calc Af Amer: >60 mL/min (ref >60)
GFR calc Af Amer: >60 mL/min (ref >60)
GFR calc Af Amer: >60 mL/min (ref >60)
GFR calc Af Amer: >60 mL/min (ref >60)
GFR calc non Af Amer: >60 mL/min (ref >60)
Glucose, Bld: 106 mg/dL — ABNORMAL HIGH (ref 70–99)
Glucose, Bld: 121 mg/dL — ABNORMAL HIGH (ref 70–99)
Potassium: 3.7 mEq/L (ref 3.5–5.1)
Sodium: 130 mEq/L — ABNORMAL LOW (ref 135–145)
Sodium: 130 mEq/L — ABNORMAL LOW (ref 135–145)

## 2010-07-07 LAB — DIFFERENTIAL
Basophils Absolute: 0.1 10*3/uL (ref 0.0–0.1)
Basophils Relative: 0 % (ref 0–1)
Basophils Relative: 1 % (ref 0–1)
Eosinophils Absolute: 0.1 10*3/uL (ref 0.0–0.7)
Eosinophils Absolute: 0.2 10*3/uL (ref 0.0–0.7)
Eosinophils Relative: 3 % (ref 0–5)
Lymphocytes Relative: 20 % (ref 12–46)
Lymphs Abs: 1.9 10*3/uL (ref 0.7–4.0)
Monocytes Absolute: 1 10*3/uL (ref 0.1–1.0)
Monocytes Relative: 12 % (ref 3–12)
Monocytes Relative: 7 % (ref 3–12)
Monocytes Relative: 9 % (ref 3–12)
Neutro Abs: 11.2 10*3/uL — ABNORMAL HIGH (ref 1.7–7.7)
Neutro Abs: 6.8 10*3/uL (ref 1.7–7.7)
Neutrophils Relative %: 56 % (ref 43–77)
Neutrophils Relative %: 71 % (ref 43–77)
Neutrophils Relative %: 79 % — ABNORMAL HIGH (ref 43–77)

## 2010-07-07 LAB — GLUCOSE, CAPILLARY
Glucose-Capillary: 102 mg/dL — ABNORMAL HIGH (ref 70–99)
Glucose-Capillary: 105 mg/dL — ABNORMAL HIGH (ref 70–99)
Glucose-Capillary: 108 mg/dL — ABNORMAL HIGH (ref 70–99)
Glucose-Capillary: 115 mg/dL — ABNORMAL HIGH (ref 70–99)
Glucose-Capillary: 129 mg/dL — ABNORMAL HIGH (ref 70–99)
Glucose-Capillary: 130 mg/dL — ABNORMAL HIGH (ref 70–99)
Glucose-Capillary: 136 mg/dL — ABNORMAL HIGH (ref 70–99)
Glucose-Capillary: 146 mg/dL — ABNORMAL HIGH (ref 70–99)
Glucose-Capillary: 162 mg/dL — ABNORMAL HIGH (ref 70–99)

## 2010-07-07 LAB — CBC
HCT: 27.6 % — ABNORMAL LOW (ref 36.0–46.0)
HCT: 28.2 % — ABNORMAL LOW (ref 36.0–46.0)
HCT: 34.6 % — ABNORMAL LOW (ref 36.0–46.0)
Hemoglobin: 11.6 g/dL — ABNORMAL LOW (ref 12.0–15.0)
Hemoglobin: 9.3 g/dL — ABNORMAL LOW (ref 12.0–15.0)
MCHC: 33.6 g/dL (ref 30.0–36.0)
MCHC: 33.6 g/dL (ref 30.0–36.0)
MCHC: 34.1 g/dL (ref 30.0–36.0)
MCV: 86.8 fL (ref 78.0–100.0)
MCV: 88.1 fL (ref 78.0–100.0)
MCV: 88.6 fL (ref 78.0–100.0)
MCV: 88.9 fL (ref 78.0–100.0)
Platelets: 196 10*3/uL (ref 150–400)
Platelets: 298 10*3/uL (ref 150–400)
RBC: 2.88 MIL/uL — ABNORMAL LOW (ref 3.87–5.11)
RBC: 3.14 MIL/uL — ABNORMAL LOW (ref 3.87–5.11)
RBC: 3.25 MIL/uL — ABNORMAL LOW (ref 3.87–5.11)
RDW: 13.1 % (ref 11.5–15.5)
RDW: 13.2 % (ref 11.5–15.5)
WBC: 14.1 10*3/uL — ABNORMAL HIGH (ref 4.0–10.5)
WBC: 9.7 10*3/uL (ref 4.0–10.5)

## 2010-07-07 LAB — CULTURE, ROUTINE-ABSCESS

## 2010-07-07 LAB — IRON AND TIBC
Iron: 34 ug/dL — ABNORMAL LOW (ref 42–135)
TIBC: 165 ug/dL — ABNORMAL LOW (ref 250–470)

## 2010-07-07 LAB — COMPREHENSIVE METABOLIC PANEL
ALT: 11 U/L (ref 0–35)
Albumin: 3.8 g/dL (ref 3.5–5.2)
Alkaline Phosphatase: 42 U/L (ref 39–117)
BUN: 8 mg/dL (ref 6–23)
Chloride: 91 mEq/L — ABNORMAL LOW (ref 96–112)
Glucose, Bld: 140 mg/dL — ABNORMAL HIGH (ref 70–99)
Potassium: 3.5 mEq/L (ref 3.5–5.1)
Sodium: 126 mEq/L — ABNORMAL LOW (ref 135–145)
Total Bilirubin: 0.9 mg/dL (ref 0.3–1.2)
Total Protein: 6.9 g/dL (ref 6.0–8.3)

## 2010-07-07 LAB — CARDIAC PANEL(CRET KIN+CKTOT+MB+TROPI)
CK, MB: 3.6 ng/mL (ref 0.3–4.0)
CK, MB: 3.8 ng/mL (ref 0.3–4.0)
CK, MB: 3.9 ng/mL (ref 0.3–4.0)
Relative Index: 1.8 (ref 0.0–2.5)
Relative Index: 2.1 (ref 0.0–2.5)
Total CK: 181 U/L — ABNORMAL HIGH (ref 7–177)
Troponin I: 0.13 ng/mL — ABNORMAL HIGH (ref 0.00–0.06)

## 2010-07-07 LAB — BRAIN NATRIURETIC PEPTIDE: Pro B Natriuretic peptide (BNP): 603 pg/mL — ABNORMAL HIGH (ref 0.0–100.0)

## 2010-07-07 LAB — URINALYSIS, ROUTINE W REFLEX MICROSCOPIC
Bilirubin Urine: NEGATIVE
Glucose, UA: NEGATIVE mg/dL
Hgb urine dipstick: NEGATIVE
Ketones, ur: NEGATIVE mg/dL
Protein, ur: NEGATIVE mg/dL
Urobilinogen, UA: 0.2 mg/dL (ref 0.0–1.0)

## 2010-07-07 LAB — CULTURE, BLOOD (ROUTINE X 2)
Culture: NO GROWTH
Culture: NO GROWTH

## 2010-07-07 LAB — MAGNESIUM
Magnesium: 1.4 mg/dL — ABNORMAL LOW (ref 1.5–2.5)
Magnesium: 1.4 mg/dL — ABNORMAL LOW (ref 1.5–2.5)

## 2010-07-07 LAB — TSH: TSH: 0.585 u[IU]/mL (ref 0.350–4.500)

## 2010-07-07 LAB — VITAMIN B12: Vitamin B-12: 436 pg/mL (ref 211–911)

## 2010-07-07 LAB — HEPATIC FUNCTION PANEL
Albumin: 2.7 g/dL — ABNORMAL LOW (ref 3.5–5.2)
Alkaline Phosphatase: 35 U/L — ABNORMAL LOW (ref 39–117)
Indirect Bilirubin: 0.8 mg/dL (ref 0.3–0.9)
Total Protein: 5.7 g/dL — ABNORMAL LOW (ref 6.0–8.3)

## 2010-07-07 LAB — CREATININE, URINE, RANDOM: Creatinine, Urine: 159.6 mg/dL

## 2010-07-07 LAB — PROTIME-INR
INR: 1.42 (ref 0.00–1.49)
Prothrombin Time: 17.2 seconds — ABNORMAL HIGH (ref 11.6–15.2)

## 2010-07-07 LAB — RETICULOCYTES: Retic Count, Absolute: 26 10*3/uL (ref 19.0–186.0)

## 2010-07-07 LAB — ANAEROBIC CULTURE

## 2010-07-14 ENCOUNTER — Ambulatory Visit (INDEPENDENT_AMBULATORY_CARE_PROVIDER_SITE_OTHER): Payer: Medicare Other | Admitting: Internal Medicine

## 2010-07-14 ENCOUNTER — Encounter: Payer: Self-pay | Admitting: Internal Medicine

## 2010-07-14 VITALS — BP 144/72 | HR 64 | Temp 98.5°F | Wt 146.0 lb

## 2010-07-14 DIAGNOSIS — R0602 Shortness of breath: Secondary | ICD-10-CM

## 2010-07-14 DIAGNOSIS — R5381 Other malaise: Secondary | ICD-10-CM

## 2010-07-14 DIAGNOSIS — R5383 Other fatigue: Secondary | ICD-10-CM

## 2010-07-14 DIAGNOSIS — I10 Essential (primary) hypertension: Secondary | ICD-10-CM

## 2010-07-14 DIAGNOSIS — I2510 Atherosclerotic heart disease of native coronary artery without angina pectoris: Secondary | ICD-10-CM

## 2010-07-14 DIAGNOSIS — J449 Chronic obstructive pulmonary disease, unspecified: Secondary | ICD-10-CM

## 2010-07-14 LAB — CBC WITH DIFFERENTIAL/PLATELET
Basophils Absolute: 0.1 10*3/uL (ref 0.0–0.1)
Eosinophils Relative: 1.6 % (ref 0.0–5.0)
HCT: 39.7 % (ref 36.0–46.0)
Hemoglobin: 13.7 g/dL (ref 12.0–15.0)
Lymphocytes Relative: 27 % (ref 12.0–46.0)
Lymphs Abs: 2.4 10*3/uL (ref 0.7–4.0)
Monocytes Relative: 8.6 % (ref 3.0–12.0)
Neutro Abs: 5.6 10*3/uL (ref 1.4–7.7)
RBC: 4.52 Mil/uL (ref 3.87–5.11)
WBC: 9 10*3/uL (ref 4.5–10.5)

## 2010-07-14 LAB — BASIC METABOLIC PANEL
Calcium: 9.5 mg/dL (ref 8.4–10.5)
GFR: 71.38 mL/min (ref >60.00)
Potassium: 3.9 mEq/L (ref 3.5–5.1)
Sodium: 136 mEq/L (ref 135–145)

## 2010-07-14 LAB — HEPATIC FUNCTION PANEL
ALT: 16 U/L (ref 0–35)
AST: 17 U/L (ref 0–37)
Albumin: 4 g/dL (ref 3.5–5.2)
Alkaline Phosphatase: 51 U/L (ref 39–117)

## 2010-07-14 LAB — SEDIMENTATION RATE: Sed Rate: 19 mm/hr (ref 0–22)

## 2010-07-14 MED ORDER — AMLODIPINE BESYLATE 10 MG PO TABS: 10.0000 mg | ORAL_TABLET | Freq: Every day | ORAL | Status: DC

## 2010-07-14 NOTE — Progress Notes (Signed)
  Subjective:    Patient ID: [name] Cunningham, female    DOB: 1928/10/04, 77 y.o.   MRN: [id number]  HPI  Multiple medical problems: HTN---tolerating meds She feels fatigued and weak---not interested in activities and "i get worn out easily" COPD-has chronic shortness of breath but no recent worsening.  Past Medical History  Diagnosis Date  . HYPERLIPIDEMIA 01/06/2007  . HYPERTENSION 01/06/2007  . COPD 01/06/2007  . GERD 01/06/2007  . DIABETES MELLITUS, TYPE II 01/17/2007  . CAD 01/04/2008  . PULMONARY EMBOLISM, HX OF 01/04/2008  . ANXIETY 11/21/2008  . SLEEP APNEA 11/21/2008  . DIVERTICULITIS OF COLON 07/23/2009   Past Surgical History  Procedure Date  . Temporal artery biopsy / ligation   . Cataract extraction   . Cholecystectomy   . Abdominal hysterectomy   . Appendectomy   . Partial colectomy     diverticulitis  . Colovaginal fistula repair     after partial colectomy    reports that she has been smoking Cigarettes.  She has been smoking about 1.5 packs per day. She does not have any smokeless tobacco history on file. She reports that she does not drink alcohol or use illicit drugs. family history includes Colon cancer in her son; Diabetes in her daughter; and Heart disease in her father. Allergies  Allergen Reactions  . Hydroxyzine Hcl     REACTION: Makes feel like does not want to live     Review of Systems    patient denies chest pain, shortness of breath, orthopnea. Denies lower extremity edema, abdominal pain, change in appetite, change in bowel movements. Patient denies rashes, musculoskeletal complaints. No other specific complaints in a complete review of systems.    Objective:   Physical Exam Elderly female in no acute distress. She is accompanied by family members including her granddaughter. HEENT exam atraumatic, normocephalic, neck supple. Chest clear to auscultation without increased work of breathing. She does have decreased breath sounds at the bases.  Cardiac exam S1 and S2 are regular although distant. No significant gallop. Abdominal exam overweight, active bowel sounds, soft. Extremities there is no clubbing or edema. Neurologic exam she is alert. She has a mildly broad-based gait.       Assessment & Plan:

## 2010-07-15 NOTE — Assessment & Plan Note (Signed)
Fair control. Continue current medications. She may need further treatment but I don't want to add any medications at this time.

## 2010-07-15 NOTE — Assessment & Plan Note (Signed)
She has chronic shortness of breath. I don't think any of her symptoms are related to exacerbation of COPD. I'm unclear as to what her fatigue is related to. It could just be deconditioning. I have asked her to start walking more and become more active.

## 2010-07-15 NOTE — Assessment & Plan Note (Signed)
She does not have any specific symptoms. I don't think any of her symptoms are related to coronary artery disease. I think the best treatment at this point his risk factor modification and treatment.

## 2010-08-07 ENCOUNTER — Encounter (INDEPENDENT_AMBULATORY_CARE_PROVIDER_SITE_OTHER): Payer: Self-pay | Admitting: General Surgery

## 2010-08-22 NOTE — Assessment & Plan Note (Signed)
[facility]                                 ON-CALL NOTE   TRULA, FREDE                     MRN:          [id number]  DATE:05/09/2006                            DOB:          09/15/1928    TIME OF INTERACTION:  8:42 p.m.   PHONE NUMBER:  [phone].   CALLER:  Tommie Ard, daughter.   OBJECTIVE:  The patient earlier this afternoon was having blurred  vision. Her daughter took her blood pressure and it was elevated at  188/86. Again this was a few hours ago. She typically runs in the 160  range. She has no other symptoms and is speaking well in the background  ( I am able to hear her while on the phone with hte daughter.)  Her  medications are Lisinopril 20/25 and Benicar 40 mg both of which she  takes in the morning.   Objective is elevated blood pressure.   PLAN:  I told the daughter to have the mother take her Benicar again  tonight now and then start with Lisinopril 20/25 in the morning and  continue Benicar 40 mg at night and to go in to be seen if the blood  pressure continues to run high, otherwise keep her medication as  ordered.   PRIMARY CARE PHYSICIAN:  Dr. Cato Mulligan.   HOME OFFICE:  [facility].     Arta Silence, MD  Electronically Signed    RNS/MedQ  DD: 05/09/2006  DT: 05/10/2006  Job #: [id number]

## 2010-08-22 NOTE — Op Note (Signed)
NAMEJACOB, [name] Cunningham            ACCOUNT NO.:  [id number]   MEDICAL RECORD NO.:  [id number]          PATIENT TYPE:  AMB   LOCATION:  DAY                          FACILITY:  [facility]   PHYSICIAN:  Timothy E. Earlene Plater, M.D. DATE OF BIRTH:  [birth year]   DATE OF PROCEDURE:  06/01/2006  DATE OF DISCHARGE:                               OPERATIVE REPORT   PREOPERATIVE DIAGNOSIS:  Probable temporal arteritis.   POSTOPERATIVE DIAGNOSIS:  Probable temporal arteritis.   PROCEDURE:  Temporal artery biopsy.   SURGEON:  Timothy E. Earlene Plater, M.D.   ANESTHESIA:  Local with standby.   Ms. Kastens is being evaluated for blurred vision, dizziness, headaches.  Has been started on prednisone.  There is a need for temporal artery  biopsy to confirm or deny the diagnosis.  She agrees and understands.  She is seen, identified the right temporal mark.   Patient is taken to the operating room.  She positions herself.  IV  sedation is given, only slightly.  A small area of the hair of the right  temporal area is clipped.  Then using the Doppler, the temporal artery  is clearly identified and the skin marked.  The skin is prepped and  draped in the usual fashion.  Marcaine 0.25% plain is used for local  anesthesia.  A vertical incision is made, and a segment of temporal  artery is dissected out, ligated at each of its ends, and a segment of  1.5 cm is submitted in saline to pathology.  Bleeding is otherwise  controlled with the cautery, and the wound is dry.  It is closed with 4-  0 Vicryl and Dermabond.  No complications.  Counts correct.  She  tolerated it well.  Was removed from the recovery room and will be  followed appropriately.      Timothy E. Earlene Plater, M.D.  Electronically Signed     TED/MEDQ  D:  06/01/2006  T:  06/01/2006  Job:  [id number]

## 2010-08-22 NOTE — Assessment & Plan Note (Signed)
[facility]                           GASTROENTEROLOGY OFFICE NOTE   [name] Cunningham, [name] Cunningham                   MRN:          [id number]  DATE:10/20/2005                            DOB:          March 19, 1929    HISTORY:  [name] Cunningham is a very nice 77 year old white female who we saw in  the past for H. pylori gastropathy, symptomatic cholelithiasis, status post  cholecystectomy in 2000.  She also has a positive family history of colon  cancer in her daughter and had periodic colonoscopies, last one November  2004, with findings of diverticulosis of the left colon.  She is due for  repeat colonoscopy in November 2009.  She is here today because of urgent  diarrhea and occasional gas and bloating in the lower abdomen.  She has had  several accidents while grocery shopping, needing to use the bathroom right  away.  Her weight has been stable.  She denies rectal bleeding.  Her eating  habits are not particularly healthy, especially with respect to high fiber.  Her hemoglobin this month was normal at 13.4, hematocrit 39.8.   MEDICATIONS:  1.  Benicar 20 mg p.o. daily.  2.  Zegerid one p.o. daily.  3.  Lisinopril.  4.  Aspirin 81 mg p.o. daily.   PHYSICAL EXAMINATION:  VITAL SIGNS:  Blood pressure 122/66, pulse 68, weight  162 pounds.  GENERAL:  She was alert and oriented, in no distress.  She was mildly  overweight.  LUNGS:  Clear to auscultation.  CARDIAC:  Normal S1 and S2.  ABDOMEN:  Large, obese with normoactive bowel sounds.  Mild tenderness in  the epigastrium, more on the left than right lower quadrant.  No distention.  RECTAL:  Normal rectal tone.  Stool was Hemoccult negative.   IMPRESSION:  A 77 year old white female with symptoms of irritable bowel  syndrome.  She had a recent colonoscopy and is not due for next colonoscopy  for another three years.  She has known diverticulosis but her symptoms are  somewhat suggestive of IBS with fecal  incontinence.  Her rectal tone is  normal to somewhat decreased.  She had a cholecystectomy several years ago  which might have aggravated her irritable bowel syndrome, causing more  diarrhea.  She has a history of H. pylori which was treated appropriately  after endoscopy in 1998.   PLAN:  1.  I have discussed irritable bowel syndrome with the patient.  I have      advised her to stay on a high-fiber, low-fat diet and gave her samples      of Fibersure to take one pack a day and after she runs out of the      samples, to purchase a container, taking 1 to 2 teaspoons daily.  I      emphasized the importance of taking on a daily basis rather than p.r.n.      basis.  2.  Begin Bentyl 10 mg twice a day.  If she becomes constipated, she may cut      back to 10 mg once a day, but she needs to  continue her antispasmodics      indefinitely because of her IBS.                                   Hedwig Morton. Juanda Chance, MD   DMB/MedQ  DD:  10/20/2005  DT:  10/21/2005  Job #:  [id number]   cc:   Valetta Mole. Swords, MD

## 2010-08-25 ENCOUNTER — Other Ambulatory Visit: Payer: Self-pay | Admitting: Internal Medicine

## 2010-08-26 ENCOUNTER — Telehealth: Payer: Self-pay | Admitting: *Deleted

## 2010-08-26 NOTE — Telephone Encounter (Signed)
Pt aware.

## 2010-08-26 NOTE — Telephone Encounter (Signed)
Continue all for now

## 2010-08-26 NOTE — Telephone Encounter (Signed)
Pt is taking lisinopril, norvasc, and metoprolol.  She wanted to know if she was to stop taking the metoprolol.

## 2010-09-09 ENCOUNTER — Other Ambulatory Visit: Payer: Self-pay | Admitting: *Deleted

## 2010-09-09 MED ORDER — ALPRAZOLAM 1 MG PO TABS: 1.0000 mg | ORAL_TABLET | Freq: Every evening | ORAL | Status: DC | PRN

## 2010-09-10 ENCOUNTER — Other Ambulatory Visit: Payer: Self-pay | Admitting: *Deleted

## 2010-12-29 LAB — DIFFERENTIAL
Lymphocytes Relative: 45
Lymphs Abs: 3.6
Monocytes Relative: 8
Neutrophils Relative %: 41 — ABNORMAL LOW

## 2010-12-29 LAB — COMPREHENSIVE METABOLIC PANEL
CO2: 29
Calcium: 9.5
Creatinine, Ser: 0.78
GFR calc Af Amer: >60
GFR calc non Af Amer: >60
Glucose, Bld: 116 — ABNORMAL HIGH
Sodium: 136
Total Protein: 7.4

## 2010-12-29 LAB — CBC
Hemoglobin: 12.1
MCHC: 34.4
MCV: 82.2
RBC: 4.29
RDW: 13.8

## 2010-12-29 LAB — POCT CARDIAC MARKERS
CKMB, poc: <1 — ABNORMAL LOW
Myoglobin, poc: 40
Operator id: 4533
Troponin i, poc: <0.05

## 2010-12-30 ENCOUNTER — Other Ambulatory Visit: Payer: Self-pay | Admitting: *Deleted

## 2010-12-30 MED ORDER — HYDROCODONE-ACETAMINOPHEN 5-325 MG PO TABS: ORAL_TABLET | ORAL | Status: DC

## 2011-01-12 ENCOUNTER — Other Ambulatory Visit: Payer: Self-pay | Admitting: Internal Medicine

## 2011-01-12 MED ORDER — ALPRAZOLAM 1 MG PO TABS: 1.0000 mg | ORAL_TABLET | Freq: Every evening | ORAL | Status: DC | PRN

## 2011-01-28 ENCOUNTER — Ambulatory Visit (INDEPENDENT_AMBULATORY_CARE_PROVIDER_SITE_OTHER): Payer: Medicare Other | Admitting: Internal Medicine

## 2011-01-28 ENCOUNTER — Encounter: Payer: Self-pay | Admitting: Internal Medicine

## 2011-01-28 DIAGNOSIS — Z23 Encounter for immunization: Secondary | ICD-10-CM

## 2011-01-28 DIAGNOSIS — I739 Peripheral vascular disease, unspecified: Secondary | ICD-10-CM

## 2011-01-28 DIAGNOSIS — E785 Hyperlipidemia, unspecified: Secondary | ICD-10-CM

## 2011-01-28 DIAGNOSIS — M549 Dorsalgia, unspecified: Secondary | ICD-10-CM

## 2011-01-28 DIAGNOSIS — J449 Chronic obstructive pulmonary disease, unspecified: Secondary | ICD-10-CM

## 2011-01-28 DIAGNOSIS — J4489 Other specified chronic obstructive pulmonary disease: Secondary | ICD-10-CM

## 2011-01-28 DIAGNOSIS — E119 Type 2 diabetes mellitus without complications: Secondary | ICD-10-CM

## 2011-01-28 DIAGNOSIS — G4730 Sleep apnea, unspecified: Secondary | ICD-10-CM

## 2011-01-28 DIAGNOSIS — M353 Polymyalgia rheumatica: Secondary | ICD-10-CM

## 2011-01-28 LAB — HEPATIC FUNCTION PANEL
ALT: 20 U/L (ref 0–35)
AST: 22 U/L (ref 0–37)
Albumin: 4.4 g/dL (ref 3.5–5.2)
Alkaline Phosphatase: 47 U/L (ref 39–117)
Bilirubin, Direct: 0 mg/dL (ref 0.0–0.3)
Total Bilirubin: 0.5 mg/dL (ref 0.3–1.2)
Total Protein: 7.7 g/dL (ref 6.0–8.3)

## 2011-01-28 LAB — CBC WITH DIFFERENTIAL/PLATELET
Eosinophils Absolute: 0.1 10*3/uL (ref 0.0–0.7)
Eosinophils Relative: 1.1 % (ref 0.0–5.0)
Lymphocytes Relative: 19.7 % (ref 12.0–46.0)
MCV: 88.1 fl (ref 78.0–100.0)
Monocytes Absolute: 0.6 10*3/uL (ref 0.1–1.0)
Neutrophils Relative %: 72.5 % (ref 43.0–77.0)
Platelets: 254 10*3/uL (ref 150.0–400.0)
WBC: 10.1 10*3/uL (ref 4.5–10.5)

## 2011-01-28 LAB — BASIC METABOLIC PANEL
Calcium: 9.8 mg/dL (ref 8.4–10.5)
GFR: 63.21 mL/min (ref >60.00)
Potassium: 4.5 mEq/L (ref 3.5–5.1)
Sodium: 138 mEq/L (ref 135–145)

## 2011-01-28 LAB — LIPID PANEL
HDL: 35.9 mg/dL — ABNORMAL LOW (ref >39.00)
Triglycerides: 318 mg/dL — ABNORMAL HIGH (ref 0.0–149.0)
VLDL: 63.6 mg/dL — ABNORMAL HIGH (ref 0.0–40.0)

## 2011-01-28 LAB — HEMOGLOBIN A1C: Hgb A1c MFr Bld: 6.5 % (ref 4.6–6.5)

## 2011-01-28 LAB — LDL CHOLESTEROL, DIRECT: Direct LDL: 117.6 mg/dL

## 2011-01-28 LAB — SEDIMENTATION RATE: Sed Rate: 20 mm/hr (ref 0–22)

## 2011-01-28 NOTE — Assessment & Plan Note (Signed)
Well controlled will check ESR

## 2011-01-28 NOTE — Assessment & Plan Note (Signed)
By history--- i think she needs further evaluation. ABI with arterial doppler

## 2011-01-28 NOTE — Assessment & Plan Note (Signed)
She has quit smoking May need nocturnal oxygen with dx of OSA

## 2011-01-28 NOTE — Assessment & Plan Note (Signed)
She has severe back pain.  Hx of ESI years ago Needs MRI and then consider ESI

## 2011-01-28 NOTE — Assessment & Plan Note (Signed)
No current meds- Needs f/u labs

## 2011-01-28 NOTE — Assessment & Plan Note (Signed)
Encouraged to use CPAP. 

## 2011-01-28 NOTE — Assessment & Plan Note (Signed)
No meds. Will check labs She is at high risk for vascular event given age.

## 2011-01-30 ENCOUNTER — Other Ambulatory Visit (INDEPENDENT_AMBULATORY_CARE_PROVIDER_SITE_OTHER): Payer: Self-pay | Admitting: General Surgery

## 2011-01-30 ENCOUNTER — Other Ambulatory Visit: Payer: Self-pay | Admitting: Family Medicine

## 2011-01-30 NOTE — Progress Notes (Signed)
  Subjective:    Patient ID: [name] Cunningham, female    DOB: January 15, 1929, 77 y.o.   MRN: [id number]  HPI Patient is accompanied by her daughter. Patient comes in for followup. She has multiple medical problems. She has a history of hypertension, COPD, coronary artery disease. She is feeling reasonably well. She denies any chest pain, shortness of breath, PND. She does admit to some fatigue. Patient has a history of diabetes and is on low-dose prednisone for polymyalgia rheumatica.   Past Medical History  Diagnosis Date  . HYPERLIPIDEMIA 01/06/2007  . HYPERTENSION 01/06/2007  . COPD 01/06/2007  . DIABETES MELLITUS, TYPE II 01/17/2007  . CAD 01/04/2008  . PULMONARY EMBOLISM, HX OF 01/04/2008  . ANXIETY 11/21/2008  . SLEEP APNEA 11/21/2008  . DIVERTICULITIS OF COLON 07/23/2009  . Fistula   . GERD 01/06/2007   Past Surgical History  Procedure Date  . Temporal artery biopsy / ligation   . Cataract extraction   . Cholecystectomy   . Abdominal hysterectomy   . Appendectomy   . Partial colectomy     diverticulitis  . Colovaginal fistula repair     after partial colectomy  . Tubal ligation   . Gallbladder surgery   . Tonsillectomy 77 YEARS OLD    reports that she has been smoking Cigarettes.  She has been smoking about 1.5 packs per day. She does not have any smokeless tobacco history on file. She reports that she does not drink alcohol or use illicit drugs. family history includes Asthma in her father; Cancer in her mother; Colon cancer in her son; Diabetes in her daughter; and Heart disease in her father. Allergies  Allergen Reactions  . Hydroxyzine Hcl     REACTION: Makes feel like does not want to live    Review of Systems      patient denies chest pain, shortness of breath, orthopnea. Denies lower extremity edema, abdominal pain, change in appetite, change in bowel movements. Patient denies rashes, musculoskeletal complaints. No other specific complaints in a complete review of  systems.  Objective:   Physical Exam  Well-developed well-nourished female in no acute distress. HEENT exam atraumatic, normocephalic, extraocular muscles are intact. Neck is supple. No jugular venous distention no thyromegaly. Chest clear to auscultation without increased work of breathing. Cardiac exam S1 and S2 are regular. Abdominal exam active bowel sounds, soft, nontender. Extremities no edema. Neurologic exam she is alert without any motor sensory deficits. Gait is broad-based.        Assessment & Plan:

## 2011-02-03 ENCOUNTER — Ambulatory Visit
Admission: RE | Admit: 2011-02-03 | Discharge: 2011-02-03 | Disposition: A | Discharge status: Home / Self Care | Payer: Medicare Other | Source: Ambulatory Visit | Attending: Internal Medicine | Admitting: Internal Medicine

## 2011-02-03 DIAGNOSIS — M549 Dorsalgia, unspecified: Secondary | ICD-10-CM

## 2011-02-04 NOTE — Telephone Encounter (Signed)
Refill request for phenergan directed back to Dr Cato Mulligan. [facility] will contact his office.

## 2011-02-05 ENCOUNTER — Encounter (INDEPENDENT_AMBULATORY_CARE_PROVIDER_SITE_OTHER): Payer: Medicare Other | Admitting: *Deleted

## 2011-02-05 ENCOUNTER — Other Ambulatory Visit: Payer: Self-pay | Admitting: Internal Medicine

## 2011-02-05 ENCOUNTER — Telehealth: Payer: Self-pay | Admitting: Internal Medicine

## 2011-02-05 DIAGNOSIS — M549 Dorsalgia, unspecified: Secondary | ICD-10-CM

## 2011-02-05 DIAGNOSIS — I70219 Atherosclerosis of native arteries of extremities with intermittent claudication, unspecified extremity: Secondary | ICD-10-CM

## 2011-02-05 DIAGNOSIS — I739 Peripheral vascular disease, unspecified: Secondary | ICD-10-CM

## 2011-02-05 DIAGNOSIS — IMO0002 Reserved for concepts with insufficient information to code with codable children: Secondary | ICD-10-CM

## 2011-02-05 NOTE — Telephone Encounter (Signed)
Pts son is req a call back from nurse or Dr Cato Mulligan re: pts MRI results and what options are available. Pts son in [place] and he would be the one that would need to be contacted, not pts daughter.

## 2011-02-05 NOTE — Telephone Encounter (Signed)
Refer neurosurgery 

## 2011-02-05 NOTE — Telephone Encounter (Signed)
Pt had mri and they found slipped disk. Suggested physical therapy, but pt has already had done twice before and it did not help. What are some other options, besides physical therapy?

## 2011-02-06 NOTE — Telephone Encounter (Signed)
Referral order placed, pt aware 

## 2011-03-03 ENCOUNTER — Ambulatory Visit (INDEPENDENT_AMBULATORY_CARE_PROVIDER_SITE_OTHER): Payer: Medicare Other | Admitting: Cardiovascular Disease

## 2011-03-03 ENCOUNTER — Encounter: Payer: Self-pay | Admitting: Cardiovascular Disease

## 2011-03-03 VITALS — BP 143/76 | HR 59 | Ht 64.0 in | Wt 153.0 lb

## 2011-03-03 DIAGNOSIS — I739 Peripheral vascular disease, unspecified: Secondary | ICD-10-CM

## 2011-03-03 DIAGNOSIS — F17200 Nicotine dependence, unspecified, uncomplicated: Secondary | ICD-10-CM

## 2011-03-03 MED ORDER — CILOSTAZOL 50 MG PO TABS: 50.0000 mg | ORAL_TABLET | Freq: Two times a day (BID) | ORAL | Status: DC

## 2011-03-03 NOTE — Assessment & Plan Note (Signed)
Complete cessation encouraged. 

## 2011-03-03 NOTE — Patient Instructions (Signed)
Your physician wants you to follow-up in 6 months. You will receive a reminder letter in the mail two months in advance. If you don't receive a letter, please call our office to schedule the follow-up appointment.  Your physician has recommended you make the following change in your medication: Start pletal 50 mg by mouth twice daily  Your physician has requested that you have an ankle brachial index (ABI). During this test an ultrasound and blood pressure cuff are used to evaluate the arteries that supply the arms and legs with blood. Allow thirty minutes for this exam. There are no restrictions or special instructions.To be done in 6 months on day of appt with Dr. Clifton James

## 2011-03-03 NOTE — Progress Notes (Signed)
History of Present Illness: 77 yo WF with history of tobacco abuse, HLD, HTN, TIA, mild COPD, borderline DM, mild CAD by remote cath, OSA, GERD and newly diagnosed PAD here today to establish in our  PV clinic. She was seen by Dr. Cato Mulligan recently and had c/o bilateral lower ext pain with ambulation. She was referred for lower extremity arterial dopplers on 02/05/11 which showed bilateral superficial artery occlusions near the ostium in both legs with reconsitution in the adductor canal in the distal SFA bilaterally.  Right ABI is 0.71 and left ABI is 0.67. She tells me that she been doing well. She did have a TIA in 2011 and had similar symptoms of confusion last week. She had normal carotid artery dopplers 2/12 while in [facility].   She describes back pain with sitting. She has pain in her lower legs when walking over 100 yards. Described as a cramping in the calf muscles. No rest pain. No ulcerations. This has been occurring for years. She has been smoking since she was 77 years old.   Past Medical History  Diagnosis Date  . HYPERLIPIDEMIA 01/06/2007  . HYPERTENSION 01/06/2007  . COPD 01/06/2007  . DIABETES MELLITUS, TYPE II 01/17/2007  . ANXIETY 11/21/2008  . SLEEP APNEA 11/21/2008  . DIVERTICULITIS OF COLON 07/23/2009  . Fistula   . GERD 01/06/2007  . PAD (peripheral artery disease)   . CAD (coronary artery disease)     Mild by cath in remote past    Past Surgical History  Procedure Date  . Temporal artery biopsy / ligation   . Cataract extraction   . Cholecystectomy   . Abdominal hysterectomy   . Appendectomy   . Partial colectomy     diverticulitis  . Colovaginal fistula repair     after partial colectomy  . Tubal ligation   . Gallbladder surgery   . Tonsillectomy 77 YEARS OLD    Current Outpatient Prescriptions  Medication Sig Dispense Refill  . ALPRAZolam (XANAX) 1 MG tablet Take 1 tablet (1 mg total) by mouth at bedtime as needed.  30 tablet  3  . amLODipine  (NORVASC) 10 MG tablet TAKE 1 TABLET BY MOUTH EVERY DAY  30 tablet  5  . aspirin 325 MG tablet Take 325 mg by mouth daily.        . [facility] IRON 325 (65 FE) MG tablet TAKE 1 TABLET BY MOUTH 2 TIMES A DAY  180 tablet  3  . diclofenac sodium (VOLTAREN) 1 % GEL Apply topically 2 (two) times daily. prn      . fluticasone (FLONASE) 50 MCG/ACT nasal spray 2 sprays by Nasal route daily.  16 g  11  . HYDROcodone-acetaminophen (NORCO) 5-325 MG per tablet 1 po qd prn for pain, not to exceed 5 per week  25 tablet  2  . lisinopril (PRINIVIL,ZESTRIL) 10 MG tablet Take 1 tablet (10 mg total) by mouth daily.  30 tablet  11  . meclizine (ANTIVERT) 25 MG tablet TAKE 1 TABLET EVERY 4 HOURS AS NEEDED FOR DIZZINESS  60 tablet  0  . omeprazole-sodium bicarbonate (ZEGERID) 40-1100 MG per capsule Take 1 capsule by mouth every morning before breakfast.        . predniSONE (DELTASONE) 5 MG tablet 2 tablets daily for 7 days and then one daily  60 tablet  3    Allergies  Allergen Reactions  . Hydroxyzine Hcl     REACTION: Makes feel like does not want to live  History   Social History  . Marital Status: Widowed    Spouse Name: N/A    Number of Children: N/A  . Years of Education: N/A   Occupational History  . Not on file.   Social History Main Topics  . Smoking status: Current Everyday Smoker -- 1.5 packs/day    Types: Cigarettes  . Smokeless tobacco: Not on file  . Alcohol Use: No  . Drug Use: No  . Sexually Active: Not on file   Other Topics Concern  . Not on file   Social History Narrative  . No narrative on file    Family History  Problem Relation Age of Onset  . Heart disease Father   . Asthma Father   . Diabetes Daughter   . Colon cancer Son   . Cancer Mother     Review of Systems:  As stated in the HPI and otherwise negative.   BP 143/76  Pulse 59  Ht 5\' 4"  (1.626 m)  Wt 153 lb (69.4 kg)  BMI 26.26 kg/m2  Physical Examination: General: Well developed, well nourished,  NAD HEENT: OP clear, mucus membranes moist SKIN: warm, dry. No rashes. Neuro: No focal deficits Musculoskeletal: Muscle strength 5/5 all ext Psychiatric: Mood and affect normal Neck: No JVD, no carotid bruits, no thyromegaly, no lymphadenopathy. Lungs:Clear bilaterally, no wheezes, rhonci, crackles Cardiovascular: Regular rate and rhythm. No murmurs, gallops or rubs. Abdomen:Soft. Bowel sounds present. Non-tender.  Extremities: No lower extremity edema. Extensive varicose veins/reticular veins both feet and ankles. Pulses are non-palpable in the bilateral DP/PT.  Lower ext arterial dopplers 02/05/11: bilateral superficial artery occlusions near the ostium with reconsitution in the adductor canal distally. Right ABI is 0.71 and left ABI is 0.67.

## 2011-03-03 NOTE — Assessment & Plan Note (Signed)
She has bilateral superficial femoral artery occlusions with reconstitutions in the adductor canal. She has no rest pain or ulcerations. I have counseled her on proper foot care. She is instructed to wear shoes at all times when walking around. I do not think an invasive workup is necessary. A percutaneous procedure would not be beneficial as the long segment of occlusion would have a low patency rate with stenting. Lower extremity bypass surgery is not indicated with absence of limb ischemia, rest pain or ulcerations. I will continue ASA and start Pletal 50 mg po BID. She is encouraged to walk as often as possible. I will see her back in 6 months with repeat ABI at that time.

## 2011-04-20 ENCOUNTER — Other Ambulatory Visit: Payer: Self-pay | Admitting: *Deleted

## 2011-04-20 MED ORDER — HYDROCODONE-ACETAMINOPHEN 5-325 MG PO TABS: ORAL_TABLET | ORAL | Status: DC

## 2011-04-23 ENCOUNTER — Other Ambulatory Visit: Payer: Self-pay | Admitting: *Deleted

## 2011-04-23 MED ORDER — PREDNISONE 5 MG PO TABS: ORAL_TABLET | ORAL | Status: DC

## 2011-05-14 ENCOUNTER — Ambulatory Visit (INDEPENDENT_AMBULATORY_CARE_PROVIDER_SITE_OTHER): Payer: Medicare Other | Admitting: Internal Medicine

## 2011-05-14 ENCOUNTER — Encounter: Payer: Self-pay | Admitting: Internal Medicine

## 2011-05-14 VITALS — BP 146/70 | HR 80 | Temp 98.7°F | Resp 20 | Ht 64.0 in | Wt 152.0 lb

## 2011-05-14 DIAGNOSIS — J449 Chronic obstructive pulmonary disease, unspecified: Secondary | ICD-10-CM

## 2011-05-14 DIAGNOSIS — F17200 Nicotine dependence, unspecified, uncomplicated: Secondary | ICD-10-CM

## 2011-05-14 DIAGNOSIS — B999 Unspecified infectious disease: Secondary | ICD-10-CM

## 2011-05-14 DIAGNOSIS — J17 Pneumonia in diseases classified elsewhere: Secondary | ICD-10-CM

## 2011-05-14 DIAGNOSIS — J189 Pneumonia, unspecified organism: Secondary | ICD-10-CM

## 2011-05-14 DIAGNOSIS — J441 Chronic obstructive pulmonary disease with (acute) exacerbation: Secondary | ICD-10-CM

## 2011-05-14 MED ORDER — LEVOFLOXACIN 500 MG PO TABS: 500.0000 mg | ORAL_TABLET | Freq: Every day | ORAL | Status: AC

## 2011-05-14 MED ORDER — METHYLPREDNISOLONE ACETATE PF 40 MG/ML IJ SUSP: 80.0000 mg | Freq: Once | INTRAMUSCULAR | Status: DC

## 2011-05-14 MED ORDER — METHYLPREDNISOLONE ACETATE 80 MG/ML IJ SUSP
80.0000 mg | Freq: Once | INTRAMUSCULAR | Status: AC
  Administered 2011-05-14: 80 mg via INTRAMUSCULAR

## 2011-05-14 MED ORDER — LEVOFLOXACIN 500 MG PO TABS: 500.0000 mg | ORAL_TABLET | Freq: Every day | ORAL | Status: DC

## 2011-05-14 NOTE — Progress Notes (Signed)
  Subjective:    Patient ID: [name] Cunningham, female    DOB: 06-03-1928, 78 y.o.   MRN: [id number]  HPI Patient has a three-day history of upper respiratory tract infection with cough that is productive of a thick sputum, yellow in color. No shortness of breath. But she has pronounced weakness and wheezing.  Nose is running.  Complains of ear congestion and itching Sat was 93% on room air a and pulse was 75  Review of Systems  Constitutional: Positive for appetite change and fatigue.  HENT: Positive for congestion, rhinorrhea, postnasal drip, sinus pressure and ear discharge.   Respiratory: Positive for cough, shortness of breath and wheezing.   Cardiovascular: Negative for palpitations and leg swelling.  Gastrointestinal: Negative.   Genitourinary: Positive for urgency.  Neurological: Positive for weakness.  Psychiatric/Behavioral: Negative.        Objective:   Physical Exam  Nursing note and vitals reviewed. Constitutional: She is oriented to person, place, and time. She appears well-developed and well-nourished.  HENT:  Head: Normocephalic and atraumatic.       Pharynx is erythematous there is thick mucoid discharge from the nasal passages  Eyes: EOM are normal.  Neck: Normal range of motion. Neck supple.  Cardiovascular: Normal rate and regular rhythm.   No murmur heard. Pulmonary/Chest: She is in respiratory distress. She has wheezes. She has rales.       Upper lung fields have markedly increased bronchial sounds and wheezing  Abdominal: Bowel sounds are normal.  Musculoskeletal: Normal range of motion.  Neurological: She is alert and oriented to person, place, and time. A cranial nerve deficit is present.  Skin: Skin is warm and dry.          Assessment & Plan:  Acute exacerbation of COPD most probably due to a upper respiratory tract infection possibly early pneumonia.   Of note is that she has diabetes and moderate steroids can be pursued due to the wheezing  but we will be very judicious in what we give her I will give her a shot of Depo-Medrol today for the wheezing Place her on Mucinex fast max a cough and congestion We will place her on an antibiotic Levaquin 500 mg for 14 days We will give her an inhaler of albuterol to use for the next 2 weeks 2-3 times a day to help bring up the congestion

## 2011-05-14 NOTE — Progress Notes (Signed)
Addended by: Willy Eddy on: 05/14/2011 10:04 AM   Modules accepted: Orders

## 2011-05-14 NOTE — Patient Instructions (Addendum)
When you pick up your antibiotics get Mucinex fast max liquid for the cough and congestion One capfull 4 times a day.   I will give you an albuterol inhaler 2 puffs by mouth 3 times a day for the next week

## 2011-05-15 ENCOUNTER — Other Ambulatory Visit: Payer: Self-pay | Admitting: Internal Medicine

## 2011-05-29 ENCOUNTER — Other Ambulatory Visit: Payer: Self-pay

## 2011-05-29 NOTE — Telephone Encounter (Signed)
Rx request for alprazolam 1 mg.  Rx last filled 01/12/11 #[id number] rf.  Pt last seen 05/14/11.  Pls advise.

## 2011-05-30 NOTE — Telephone Encounter (Signed)
ok 

## 2011-06-01 MED ORDER — ALPRAZOLAM 1 MG PO TABS: 1.0000 mg | ORAL_TABLET | Freq: Every evening | ORAL | Status: DC | PRN

## 2011-06-02 ENCOUNTER — Encounter: Payer: Self-pay | Admitting: Internal Medicine

## 2011-06-02 ENCOUNTER — Ambulatory Visit (INDEPENDENT_AMBULATORY_CARE_PROVIDER_SITE_OTHER): Payer: Medicare Other | Admitting: Internal Medicine

## 2011-06-02 DIAGNOSIS — I2510 Atherosclerotic heart disease of native coronary artery without angina pectoris: Secondary | ICD-10-CM

## 2011-06-02 DIAGNOSIS — I10 Essential (primary) hypertension: Secondary | ICD-10-CM

## 2011-06-02 DIAGNOSIS — I499 Cardiac arrhythmia, unspecified: Secondary | ICD-10-CM

## 2011-06-02 DIAGNOSIS — E119 Type 2 diabetes mellitus without complications: Secondary | ICD-10-CM

## 2011-06-02 LAB — LIPID PANEL
Cholesterol: 186 mg/dL (ref 0–200)
LDL Cholesterol: 102 mg/dL — ABNORMAL HIGH (ref 0–99)
Triglycerides: 167 mg/dL — ABNORMAL HIGH (ref 0.0–149.0)
VLDL: 33.4 mg/dL (ref 0.0–40.0)

## 2011-06-02 LAB — BASIC METABOLIC PANEL
BUN: 12 mg/dL (ref 6–23)
Creatinine, Ser: 0.9 mg/dL (ref 0.4–1.2)
GFR: 68.33 mL/min (ref >60.00)
Glucose, Bld: 119 mg/dL — ABNORMAL HIGH (ref 70–99)

## 2011-06-02 LAB — HEPATIC FUNCTION PANEL
ALT: 12 U/L (ref 0–35)
AST: 17 U/L (ref 0–37)
Albumin: 4 g/dL (ref 3.5–5.2)
Total Bilirubin: 0.5 mg/dL (ref 0.3–1.2)

## 2011-06-02 NOTE — Progress Notes (Signed)
patient comes in for followup of multiple medical problems including type 2 diabetes, hyperlipidemia, hypertension. The patient does not check blood sugar or blood pressure at home. The patetient does not follow an exercise or diet program. The patient denies any polyuria, polydipsia.  In the past the patient has gone to diabetic treatment center. The patient is tolerating medications  Without difficulty. The patient does admit to medication compliance.    Past Medical History  Diagnosis Date  . HYPERLIPIDEMIA 01/06/2007  . HYPERTENSION 01/06/2007  . COPD 01/06/2007  . DIABETES MELLITUS, TYPE II 01/17/2007  . ANXIETY 11/21/2008  . SLEEP APNEA 11/21/2008  . DIVERTICULITIS OF COLON 07/23/2009  . Fistula   . GERD 01/06/2007  . PAD (peripheral artery disease)   . CAD (coronary artery disease)     Mild by cath in remote past    History   Social History  . Marital Status: Widowed    Spouse Name: N/A    Number of Children: N/A  . Years of Education: N/A   Occupational History  . Not on file.   Social History Main Topics  . Smoking status: Current Everyday Smoker -- 1.5 packs/day    Types: Cigarettes  . Smokeless tobacco: Not on file  . Alcohol Use: No  . Drug Use: No  . Sexually Active: Not on file   Other Topics Concern  . Not on file   Social History Narrative  . No narrative on file    Past Surgical History  Procedure Date  . Temporal artery biopsy / ligation   . Cataract extraction   . Cholecystectomy   . Abdominal hysterectomy   . Appendectomy   . Partial colectomy     diverticulitis  . Colovaginal fistula repair     after partial colectomy  . Tubal ligation   . Gallbladder surgery   . Tonsillectomy 78 YEARS OLD    Family History  Problem Relation Age of Onset  . Heart disease Father   . Asthma Father   . Diabetes Daughter   . Colon cancer Son   . Cancer Mother     Allergies  Allergen Reactions  . Hydroxyzine Hcl     REACTION: Makes feel like does not  want to live      patient denies chest pain, shortness of breath, orthopnea. Denies lower extremity edema, abdominal pain, change in appetite, change in bowel movements. Patient denies rashes, musculoskeletal complaints. No other specific complaints in a complete review of systems. She does admit to snoring  BP 112/72  Pulse 92  Temp(Src) 97.9 F (36.6 C) (Oral)  Wt 148 lb (67.132 kg)  Well-developed well-nourished female in no acute distress. HEENT exam atraumatic, normocephalic, extraocular muscles are intact. Neck is supple. No jugular venous distention no thyromegaly. Chest clear to auscultation without increased work of breathing. Cardiac exam S1 and S2 are regular. Abdominal exam active bowel sounds, soft, nontender. Extremities no edema.

## 2011-06-02 NOTE — Assessment & Plan Note (Signed)
BP Readings from Last 3 Encounters:  06/02/11 112/72  05/14/11 146/70  03/03/11 143/76   Reasonable control Continue same meds

## 2011-06-02 NOTE — Assessment & Plan Note (Signed)
No sxs Risk factor modification Needs to lose weight

## 2011-06-02 NOTE — Assessment & Plan Note (Signed)
Lab Results  Component Value Date   HGBA1C 6.5 01/28/2011  will check labs today

## 2011-06-04 ENCOUNTER — Other Ambulatory Visit: Payer: Self-pay | Admitting: Internal Medicine

## 2011-06-25 ENCOUNTER — Emergency Department: Payer: Self-pay | Admitting: Emergency Medicine

## 2011-06-25 LAB — CBC
HCT: 39.9 % (ref 35.0–47.0)
HGB: 13.6 g/dL (ref 12.0–16.0)
MCH: 30.3 pg (ref 26.0–34.0)
MCHC: 34.1 g/dL (ref 32.0–36.0)
MCV: 89 fL (ref 80–100)
Platelet: 258 10*3/uL (ref 150–440)
RBC: 4.49 10*6/uL (ref 3.80–5.20)
RDW: 13.6 % (ref 11.5–14.5)
WBC: 13.1 10*3/uL — ABNORMAL HIGH (ref 3.6–11.0)

## 2011-06-25 LAB — URINALYSIS, COMPLETE
Bacteria: NONE SEEN
Bilirubin,UR: NEGATIVE
Blood: NEGATIVE
Glucose,UR: NEGATIVE mg/dL (ref 0–75)
Hyaline Cast: 3
Ketone: NEGATIVE
Leukocyte Esterase: NEGATIVE
Nitrite: NEGATIVE
Ph: 5 (ref 4.5–8.0)
Protein: 30
RBC,UR: 1 /HPF (ref 0–5)
Specific Gravity: 1.017 (ref 1.003–1.030)
Squamous Epithelial: 1
WBC UR: <1 /HPF (ref 0–5)

## 2011-06-25 LAB — COMPREHENSIVE METABOLIC PANEL
Albumin: 3.6 g/dL (ref 3.4–5.0)
Alkaline Phosphatase: 50 U/L (ref 50–136)
Anion Gap: 14 (ref 7–16)
BUN: 9 mg/dL (ref 7–18)
Bilirubin,Total: 0.6 mg/dL (ref 0.2–1.0)
Calcium, Total: 8.9 mg/dL (ref 8.5–10.1)
Chloride: 98 mmol/L (ref 98–107)
Co2: 24 mmol/L (ref 21–32)
Creatinine: 0.92 mg/dL (ref 0.60–1.30)
EGFR (African American): >60
EGFR (Non-African Amer.): >60
Glucose: 136 mg/dL — ABNORMAL HIGH (ref 65–99)
Osmolality: 273 (ref 275–301)
Potassium: 3.6 mmol/L (ref 3.5–5.1)
SGOT(AST): 17 U/L (ref 15–37)
SGPT (ALT): 17 U/L
Sodium: 136 mmol/L (ref 136–145)
Total Protein: 7.5 g/dL (ref 6.4–8.2)

## 2011-06-25 LAB — CK TOTAL AND CKMB (NOT AT ARMC): CK-MB: <0.5 ng/mL — ABNORMAL LOW (ref 0.5–3.6)

## 2011-06-30 ENCOUNTER — Other Ambulatory Visit: Payer: Self-pay | Admitting: Internal Medicine

## 2011-07-01 LAB — CULTURE, BLOOD (SINGLE)

## 2011-07-14 ENCOUNTER — Telehealth: Payer: Self-pay | Admitting: Cardiovascular Disease

## 2011-07-14 ENCOUNTER — Other Ambulatory Visit: Payer: Self-pay | Admitting: *Deleted

## 2011-07-14 DIAGNOSIS — I10 Essential (primary) hypertension: Secondary | ICD-10-CM

## 2011-07-14 MED ORDER — LISINOPRIL 10 MG PO TABS: 10.0000 mg | ORAL_TABLET | Freq: Every day | ORAL | Status: DC

## 2011-07-14 NOTE — Telephone Encounter (Signed)
Left message to call back  

## 2011-07-14 NOTE — Telephone Encounter (Signed)
Spoke with pt's daughter who reports pt has had ankle swelling for last 3 weeks. She stated pt was also diagnosed with an irregular heart beat at that time. Daughter thought pt was to see a cardiologist but she does not know who and no appt was made. Pt currently sees Dr. Clifton James for PAD.  Daughter also states pt has shortness of breath at times.  Appt made for pt to see Dr. Clifton James tomorrow at 11:30

## 2011-07-14 NOTE — Telephone Encounter (Signed)
Pt's dtr rtn call, pls call

## 2011-07-14 NOTE — Telephone Encounter (Signed)
New msg Pt's daughter called and wanted to talk to you about her ankles swelling. Please call her back

## 2011-07-15 ENCOUNTER — Encounter: Payer: Self-pay | Admitting: Cardiovascular Disease

## 2011-07-15 ENCOUNTER — Ambulatory Visit (INDEPENDENT_AMBULATORY_CARE_PROVIDER_SITE_OTHER): Payer: Medicare Other | Admitting: Cardiovascular Disease

## 2011-07-15 VITALS — BP 130/72 | HR 96 | Ht 64.0 in | Wt 154.0 lb

## 2011-07-15 DIAGNOSIS — I739 Peripheral vascular disease, unspecified: Secondary | ICD-10-CM | POA: Insufficient documentation

## 2011-07-15 DIAGNOSIS — I779 Disorder of arteries and arterioles, unspecified: Secondary | ICD-10-CM

## 2011-07-15 DIAGNOSIS — R609 Edema, unspecified: Secondary | ICD-10-CM

## 2011-07-15 DIAGNOSIS — R600 Localized edema: Secondary | ICD-10-CM | POA: Insufficient documentation

## 2011-07-15 DIAGNOSIS — R002 Palpitations: Secondary | ICD-10-CM

## 2011-07-15 MED ORDER — DILTIAZEM HCL ER COATED BEADS 120 MG PO CP24: 120.0000 mg | ORAL_CAPSULE | Freq: Every day | ORAL | Status: DC

## 2011-07-15 MED ORDER — FUROSEMIDE 20 MG PO TABS: 20.0000 mg | ORAL_TABLET | Freq: Every day | ORAL | Status: DC

## 2011-07-15 NOTE — Assessment & Plan Note (Signed)
She has sinus rhythm with PACs but she is at high risk for atrial fibrillation. Will get 48 hour holter monitor. Will start Cardizem CD 120 mg po Qdaily. F/u 2 weeks and if evidence of atrial fib, will need to be started on anti-coagulation.

## 2011-07-15 NOTE — Progress Notes (Signed)
History of Present Illness: 78 yo WF with history of tobacco abuse, HLD, HTN, TIA, mild COPD, borderline DM, mild CAD by remote cath, OSA, GERD and PAD here today for PV follow up. She was referred for lower extremity arterial dopplers on 02/05/11 which showed bilateral superficial artery occlusions near the ostium in both legs with reconsitution in the adductor canal in the distal SFA bilaterally. Right ABI was 0.71 and left ABI is 0.67. She had normal carotid artery dopplers 2/12 while in [facility]. I did not think an invasive workup was necessary. I did not think that a percutaneous procedure would be beneficial as the long segment of occlusion would have a low patency rate with stenting. Lower extremity bypass surgery was not indicated with absence of limb ischemia, rest pain or ulcerations. I continued ASA and started Pletal 50 mg po BID.   She is here today for PV follow up. Her legs ache but this is not significantly changed. She has swelling in both legs during the day and resolves at night. She has also noticed palpitations with some chest discomfort. Some SOB. No near syncope or syncope.    Primary Care Physician: Dr. Cato Mulligan  Last Lipid Profile:  06/02/11: Total chol: 186   HDL: 51  LDL: 102  Past Medical History  Diagnosis Date  . HYPERLIPIDEMIA 01/06/2007  . HYPERTENSION 01/06/2007  . COPD 01/06/2007  . DIABETES MELLITUS, TYPE II 01/17/2007  . ANXIETY 11/21/2008  . SLEEP APNEA 11/21/2008  . DIVERTICULITIS OF COLON 07/23/2009  . Fistula   . GERD 01/06/2007  . PAD (peripheral artery disease)   . CAD (coronary artery disease)     Mild by cath in remote past    Past Surgical History  Procedure Date  . Temporal artery biopsy / ligation   . Cataract extraction   . Cholecystectomy   . Abdominal hysterectomy   . Appendectomy   . Partial colectomy     diverticulitis  . Colovaginal fistula repair     after partial colectomy  . Tubal ligation   . Gallbladder surgery   .  Tonsillectomy 78 YEARS OLD    Current Outpatient Prescriptions  Medication Sig Dispense Refill  . ALPRAZolam (XANAX) 1 MG tablet Take 1 tablet (1 mg total) by mouth at bedtime as needed.  30 tablet  3  . amLODipine (NORVASC) 10 MG tablet TAKE 1 TABLET BY MOUTH EVERY DAY  30 tablet  5  . aspirin 325 MG tablet Take 325 mg by mouth daily.        . cilostazol (PLETAL) 50 MG tablet Take 1 tablet (50 mg total) by mouth 2 (two) times daily.  60 tablet  11  . [facility] IRON 325 (65 FE) MG tablet TAKE 1 TABLET BY MOUTH 2 TIMES A DAY  180 tablet  3  . fluticasone (FLONASE) 50 MCG/ACT nasal spray 2 sprays by Nasal route daily.  16 g  11  . HYDROcodone-acetaminophen (NORCO) 5-325 MG per tablet 1 po qd prn for pain, not to exceed 5 per week  25 tablet  2  . lisinopril (PRINIVIL,ZESTRIL) 10 MG tablet Take 1 tablet (10 mg total) by mouth daily.  30 tablet  11  . omeprazole-sodium bicarbonate (ZEGERID) 40-1100 MG per capsule Take 1 capsule by mouth every morning before breakfast.        . VOLTAREN 1 % GEL APPLY TWICE A DAY TO SHOULDER AS NEEDED FOR PAIN  100 g  2    Allergies  Allergen Reactions  . Hydroxyzine Hcl     REACTION: Makes feel like does not want to live    History   Social History  . Marital Status: Widowed    Spouse Name: N/A    Number of Children: N/A  . Years of Education: N/A   Occupational History  . Not on file.   Social History Main Topics  . Smoking status: Current Everyday Smoker -- 1.5 packs/day    Types: Cigarettes  . Smokeless tobacco: Not on file  . Alcohol Use: No  . Drug Use: No  . Sexually Active: Not on file   Other Topics Concern  . Not on file   Social History Narrative  . No narrative on file    Family History  Problem Relation Age of Onset  . Heart disease Father   . Asthma Father   . Diabetes Daughter   . Colon cancer Son   . Cancer Mother     Review of Systems:  As stated in the HPI and otherwise negative.   BP 130/72  Pulse 96  Ht 5\' 4"   (1.626 m)  Wt 154 lb (69.854 kg)  BMI 26.43 kg/m2  Physical Examination: General: Well developed, well nourished, NAD HEENT: OP clear, mucus membranes moist SKIN: warm, dry. No rashes. Neuro: No focal deficits Musculoskeletal: Muscle strength 5/5 all ext Psychiatric: Mood and affect normal Neck: No JVD, no carotid bruits, no thyromegaly, no lymphadenopathy. Lungs:Clear bilaterally, no wheezes, rhonci, crackles Cardiovascular: Irregular rate and rhythm. No murmurs, gallops or rubs. Abdomen:Soft. Bowel sounds present. Non-tender.  Extremities: No lower extremity edema. Non-palpable pulses in the bilateral DP/PT.  EKG: NSR with PACs.

## 2011-07-15 NOTE — Assessment & Plan Note (Signed)
Stable. Will need repeat ABI next month.

## 2011-07-15 NOTE — Patient Instructions (Signed)
Your physician recommends that you schedule a follow-up appointment in: 2 weeks  Your physician has requested that you have an echocardiogram. Echocardiography is a painless test that uses sound waves to create images of your heart. It provides your doctor with information about the size and shape of your heart and how well your heart's chambers and valves are working. This procedure takes approximately one hour. There are no restrictions for this procedure.   Your physician has recommended that you wear a holter monitor. Holter monitors are medical devices that record the heart's electrical activity. Doctors most often use these monitors to diagnose arrhythmias. Arrhythmias are problems with the speed or rhythm of the heartbeat. The monitor is a small, portable device. You can wear one while you do your normal daily activities. This is usually used to diagnose what is causing palpitations/syncope (passing out).  Your physician has requested that you have an ankle brachial index (ABI). During this test an ultrasound and blood pressure cuff are used to evaluate the arteries that supply the arms and legs with blood. Allow thirty minutes for this exam. There are no restrictions or special instructions. Schedule on day of appt with Dr. Clifton James if possible    Your physician has recommended you make the following change in your medication: Start Cardizem CD 120 mg by mouth daily. Start furosemide 20 mg by mouth daily

## 2011-07-15 NOTE — Assessment & Plan Note (Signed)
Dependent edema. Resolves at night. Will start low dose Lasix 20 mg po Qdaily. Will get echo to assess LVEF.

## 2011-07-19 IMAGING — CT CT ANGIO CHEST
3 of 7 series · 18 of 36 positions shown · IV contrast (APPLIED)
Comparison: Chest radiograph performed 03/30/2009

CLINICAL DATA: Sudden onset of shortness of breath; history of
smoking.

CT ANGIOGRAPHY CHEST WITH CONTRAST
TECHNIQUE: Multidetector CT imaging of the chest was performed
using the standard protocol during bolus administration of
intravenous contrast.  Multiplanar CT image reconstructions
including MIPs were obtained to evaluate the vascular anatomy.
Contrast:  80 mL of Omnipaque 300 IV contrast

[Series 6: pe thins @ 1mm · axial · 0.65mm/px · z∈[+989,+1233]mm · 15 of 280 slices shown]
[im 18/280  lung]
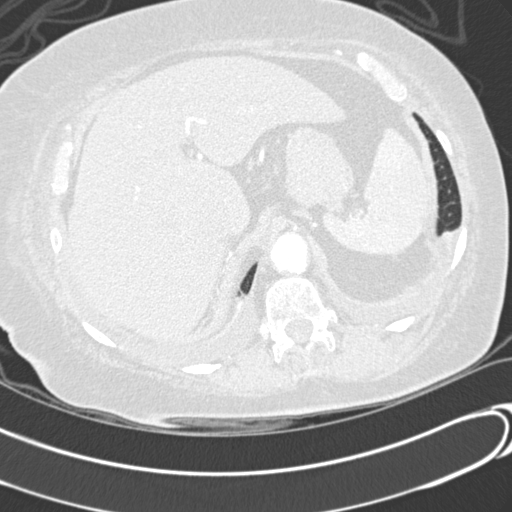
[im 35/280  mediastinal]
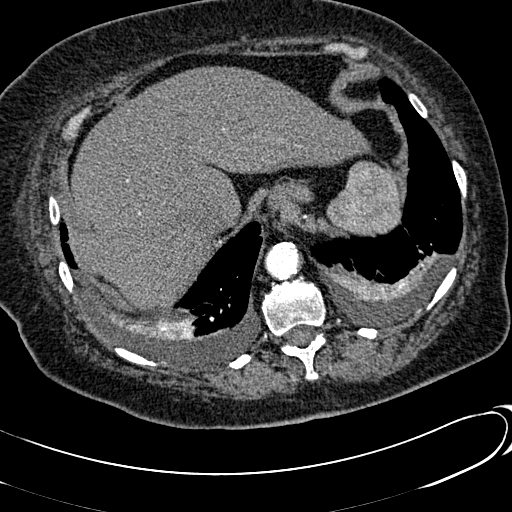
[im 53/280  lung]
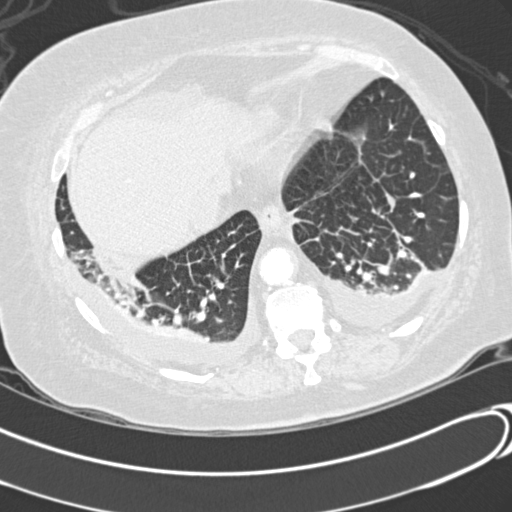
[im 70/280  mediastinal]
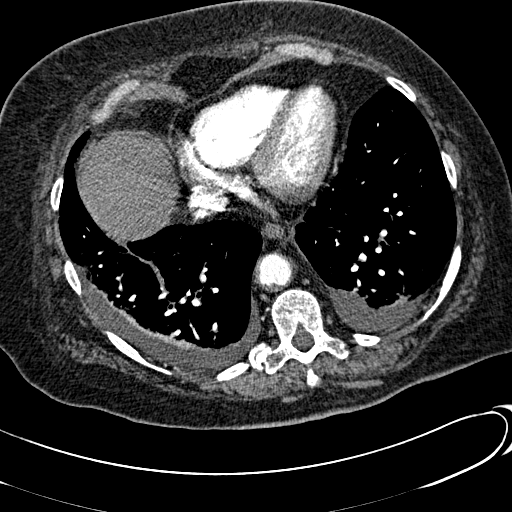
[im 88/280  lung]
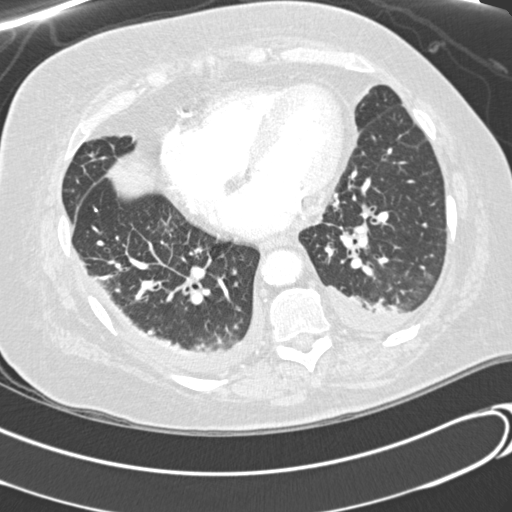
[im 105/280  mediastinal]
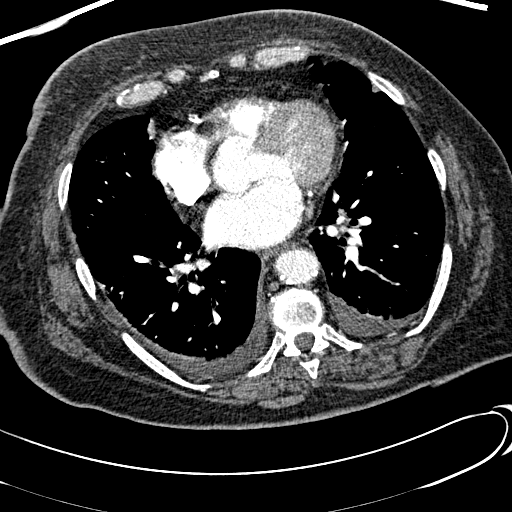
[im 123/280  lung]
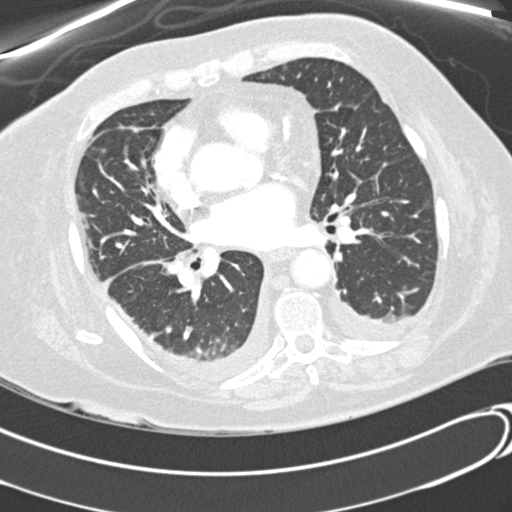
[im 140/280  mediastinal]
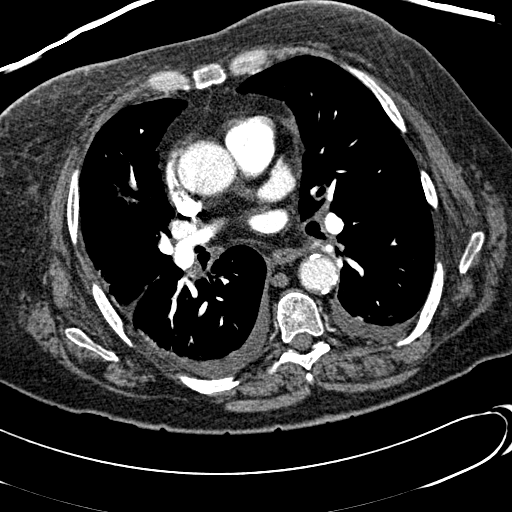
[im 157/280  lung]
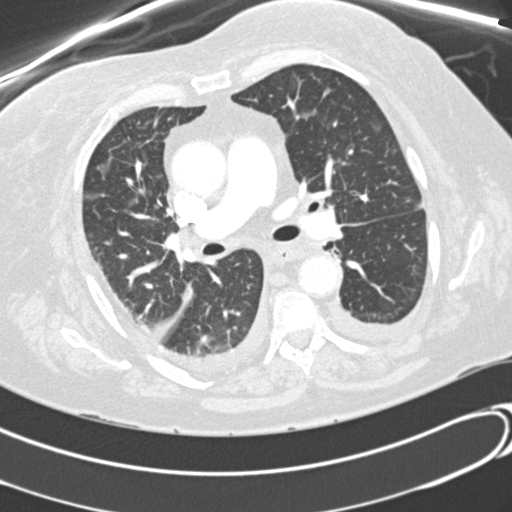
[im 175/280  mediastinal]
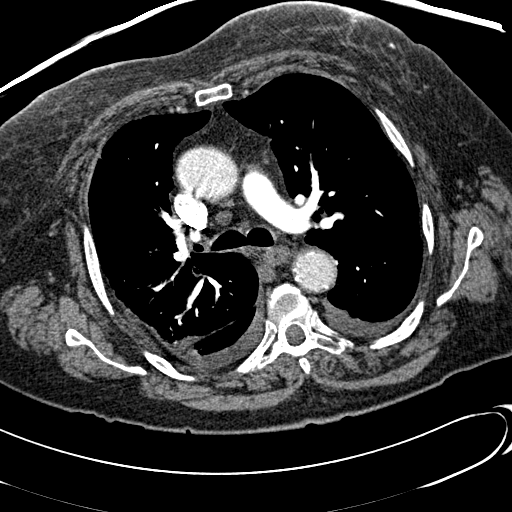
[im 192/280  lung]
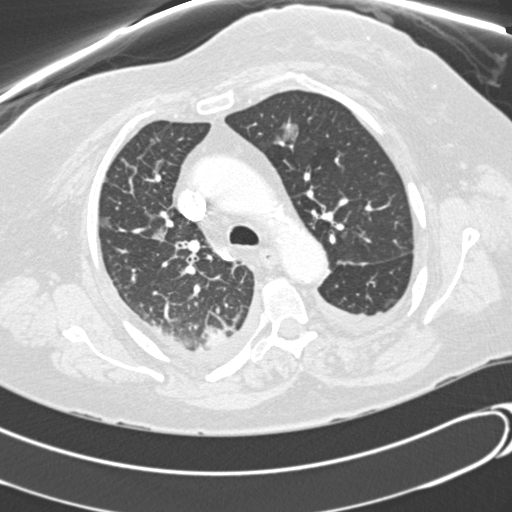
[im 210/280  mediastinal]
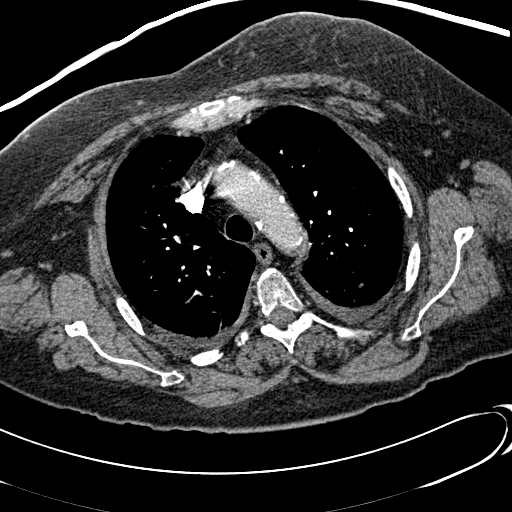
[im 227/280  lung]
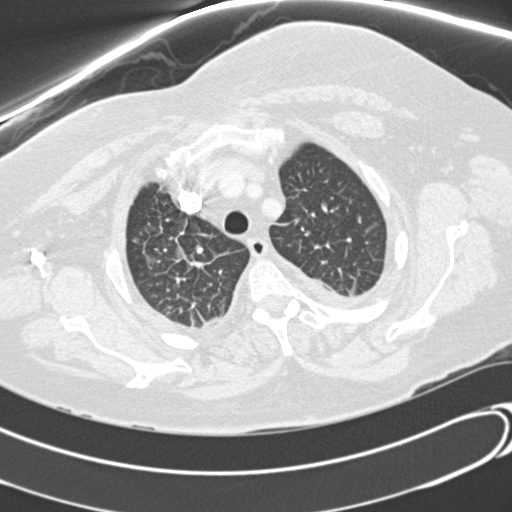
[im 245/280  mediastinal]
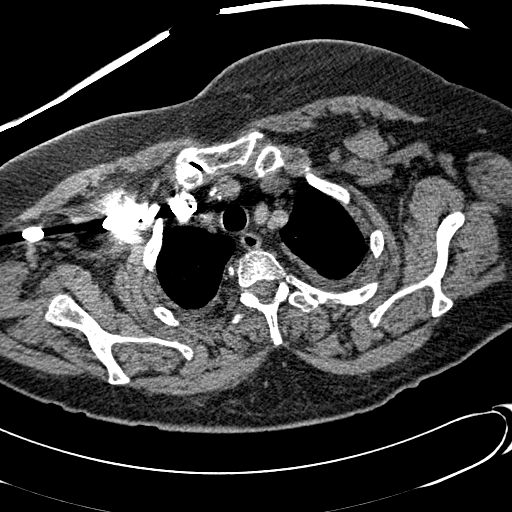
[im 262/280  lung]
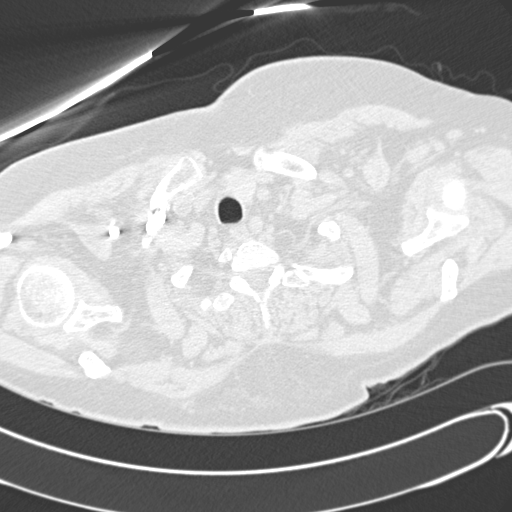

[Series 7: lung windows · axial · 0.65mm/px · z∈[+1029,+1083]mm · 2 of 93 slices shown]
[im 19/93  mediastinal]
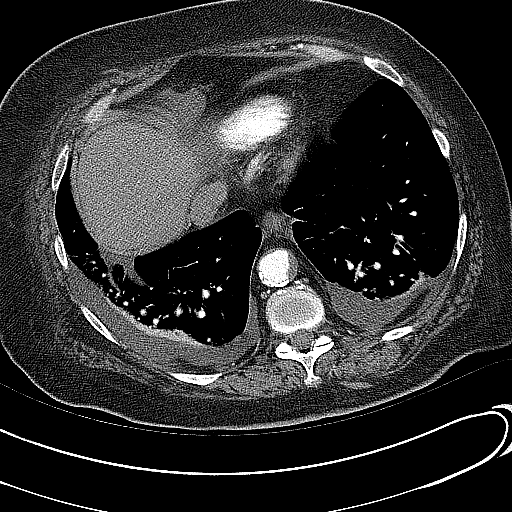
[im 37/93  mediastinal]
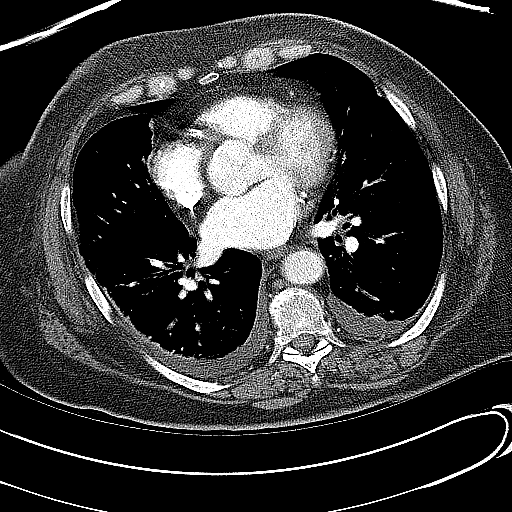

[Series 602: <mpr thick range> · coronal · 0.65mm/px · 1 of 108 slices shown]
[im 54/108  mediastinal]
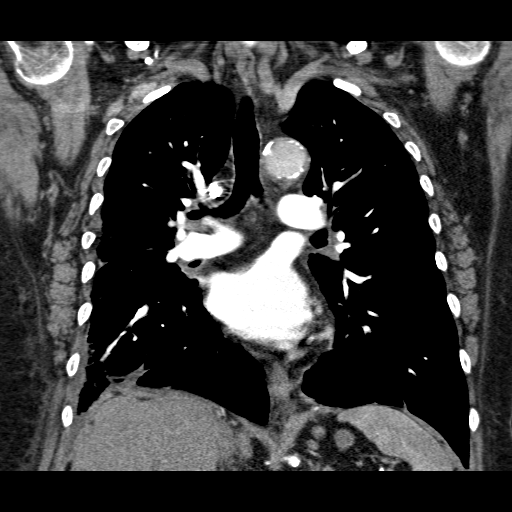

[18 of 36 positions shown; findings below may reference images not displayed]

FINDINGS: There is no evidence of pulmonary embolus.

Scattered vague ground-glass opacities are seen within both upper
lung lobes; this could reflect an atypical infectious process, or
may reflect mild interstitial edema, given the findings described
below.  Small bilateral pleural effusions are seen, with associated
partial consolidation of both lower lung lobes.  There is mild
thickening of interlobular septa, nonspecific in appearance.  There
is no evidence of significant pneumothorax.  No masses are
identified; no abnormal focal contrast enhancement is seen.

The mediastinum is unremarkable in appearance.  No mediastinal
lymphadenopathy is appreciated.  Scattered calcification is noted
along the aortic arch and descending thoracic aorta.  The great
vessels are within normal limits. No pericardial effusion is seen.
No axillary lymphadenopathy is seen.  The visualized portions of
the thyroid gland are unremarkable in appearance.

The visualized portions of the liver and spleen are unremarkable.

No acute osseous abnormalities are seen.

Review of the MIP images confirms the above findings.
IMPRESSION: 1.  No evidence of pulmonary embolus.
2.  Scattered vague ground-glass opacities within both upper lobes;
this may reflect an atypical infectious process, or could reflect
mild interstitial edema, given findings described below.
3.  Small bilateral pleural effusions noted, with associated
partial consolidation of both lower lung lobes.
4.  Mild nonspecific thickening of interlobular septa noted.

## 2011-07-28 ENCOUNTER — Other Ambulatory Visit: Payer: Self-pay

## 2011-07-28 ENCOUNTER — Encounter (INDEPENDENT_AMBULATORY_CARE_PROVIDER_SITE_OTHER): Payer: Medicare Other

## 2011-07-28 ENCOUNTER — Ambulatory Visit (HOSPITAL_COMMUNITY): Payer: Medicare Other | Attending: Cardiology

## 2011-07-28 DIAGNOSIS — I517 Cardiomegaly: Secondary | ICD-10-CM

## 2011-07-28 DIAGNOSIS — Z8673 Personal history of transient ischemic attack (TIA), and cerebral infarction without residual deficits: Secondary | ICD-10-CM | POA: Insufficient documentation

## 2011-07-28 DIAGNOSIS — R002 Palpitations: Secondary | ICD-10-CM

## 2011-07-28 DIAGNOSIS — J4489 Other specified chronic obstructive pulmonary disease: Secondary | ICD-10-CM | POA: Insufficient documentation

## 2011-07-28 DIAGNOSIS — F17200 Nicotine dependence, unspecified, uncomplicated: Secondary | ICD-10-CM | POA: Insufficient documentation

## 2011-07-28 DIAGNOSIS — I2510 Atherosclerotic heart disease of native coronary artery without angina pectoris: Secondary | ICD-10-CM | POA: Insufficient documentation

## 2011-07-28 DIAGNOSIS — I739 Peripheral vascular disease, unspecified: Secondary | ICD-10-CM | POA: Insufficient documentation

## 2011-07-28 DIAGNOSIS — G4730 Sleep apnea, unspecified: Secondary | ICD-10-CM | POA: Insufficient documentation

## 2011-07-28 DIAGNOSIS — I519 Heart disease, unspecified: Secondary | ICD-10-CM | POA: Insufficient documentation

## 2011-07-28 DIAGNOSIS — J449 Chronic obstructive pulmonary disease, unspecified: Secondary | ICD-10-CM | POA: Insufficient documentation

## 2011-07-28 DIAGNOSIS — E119 Type 2 diabetes mellitus without complications: Secondary | ICD-10-CM | POA: Insufficient documentation

## 2011-07-28 DIAGNOSIS — I10 Essential (primary) hypertension: Secondary | ICD-10-CM | POA: Insufficient documentation

## 2011-07-28 DIAGNOSIS — E785 Hyperlipidemia, unspecified: Secondary | ICD-10-CM | POA: Insufficient documentation

## 2011-07-28 DIAGNOSIS — R600 Localized edema: Secondary | ICD-10-CM

## 2011-07-28 DIAGNOSIS — R609 Edema, unspecified: Secondary | ICD-10-CM | POA: Insufficient documentation

## 2011-07-30 ENCOUNTER — Telehealth: Payer: Self-pay | Admitting: *Deleted

## 2011-07-30 DIAGNOSIS — I4891 Unspecified atrial fibrillation: Secondary | ICD-10-CM

## 2011-07-30 MED ORDER — ASPIRIN 81 MG PO TABS: 81.0000 mg | ORAL_TABLET | Freq: Every day | ORAL | Status: DC

## 2011-07-30 MED ORDER — WARFARIN SODIUM 5 MG PO TABS: 5.0000 mg | ORAL_TABLET | Freq: Every day | ORAL | Status: DC

## 2011-07-30 NOTE — Telephone Encounter (Signed)
Holter monitor results reviewed with Dr. Eden Emms and Dr. Ladona Ridgel. Monitor shows atrial fibrillation. Per Dr. Eden Emms pt to be started on Coumadin 5 mg daily and be followed by Coumadin clinic. Instructions given for pt to decrease aspirin to 81 mg daily. May continue Pletal.  I called and spoke with pt and gave her this information. She is aware she should make these changes now and that she will be seen in Coumadin clinic when here for appt with Dr. Clifton James on August 03, 2011.   Pt states she was aware that Coumadin would need to be started if irregular heart beat was seen on monitor.  She verbalizes understanding of all instructions.

## 2011-07-31 ENCOUNTER — Telehealth: Payer: Self-pay | Admitting: Cardiovascular Disease

## 2011-07-31 NOTE — Telephone Encounter (Signed)
Pt's Daughter wants to discuss what blood thinner pt needs to take because patient is on the coumadin and cilostazol but she was told to call the office and make sure she needs to take both. Pt doesn't need any meds called in daughter just wants to make sure pt is taking all she needs to take

## 2011-07-31 NOTE — Telephone Encounter (Signed)
Thanks, chris 

## 2011-07-31 NOTE — Telephone Encounter (Signed)
Spoke with pt's daughter.  Assured her okay to take agents together at this time.  Will discuss need for 3 agents (ASA, cilostazol and Coumadin) on Monday at appt with Dr. Clifton James.

## 2011-08-03 ENCOUNTER — Encounter (INDEPENDENT_AMBULATORY_CARE_PROVIDER_SITE_OTHER): Payer: Medicare Other

## 2011-08-03 ENCOUNTER — Encounter: Payer: Self-pay | Admitting: Cardiovascular Disease

## 2011-08-03 ENCOUNTER — Ambulatory Visit (INDEPENDENT_AMBULATORY_CARE_PROVIDER_SITE_OTHER): Payer: Medicare Other | Admitting: Cardiovascular Disease

## 2011-08-03 ENCOUNTER — Ambulatory Visit (INDEPENDENT_AMBULATORY_CARE_PROVIDER_SITE_OTHER): Payer: Medicare Other

## 2011-08-03 VITALS — BP 133/77 | HR 101 | Ht 64.0 in | Wt 155.0 lb

## 2011-08-03 DIAGNOSIS — I739 Peripheral vascular disease, unspecified: Secondary | ICD-10-CM

## 2011-08-03 DIAGNOSIS — R002 Palpitations: Secondary | ICD-10-CM

## 2011-08-03 DIAGNOSIS — I4891 Unspecified atrial fibrillation: Secondary | ICD-10-CM

## 2011-08-03 DIAGNOSIS — I779 Disorder of arteries and arterioles, unspecified: Secondary | ICD-10-CM

## 2011-08-03 LAB — POCT INR: INR: 4.2

## 2011-08-03 MED ORDER — DILTIAZEM HCL ER COATED BEADS 240 MG PO CP24: 240.0000 mg | ORAL_CAPSULE | Freq: Every day | ORAL | Status: DC

## 2011-08-03 NOTE — Patient Instructions (Signed)

## 2011-08-03 NOTE — Assessment & Plan Note (Signed)
Stable clinically. ABI are reduced but stable. Continue ASA 81 mg po Qdaily and Pletal. Repeat ABI one year.

## 2011-08-03 NOTE — Assessment & Plan Note (Addendum)
Holter monitor with periods of atrial fibrillation. She has been started on coumadin. Will follow up in coumadin clinic today. Will increase Cardizem CD to 240 mg po Qdaily. I explained the risk of CVA and etiology of atrial fibrillation.

## 2011-08-03 NOTE — Patient Instructions (Signed)
Your physician recommends that you schedule a follow-up appointment in: 6 weeks.   Your physician has recommended you make the following change in your medication: Increase Cardizem CD to 240 mg by mouth daily

## 2011-08-03 NOTE — Progress Notes (Signed)
History of Present Illness: 78 yo WF with history of tobacco abuse, HLD, HTN, TIA, mild COPD, borderline DM, mild CAD by remote cath, OSA, GERD and PAD here today for cardiac follow up. I have followed her in the past for her PAD. She was referred for lower extremity arterial dopplers on 02/05/11 which showed bilateral superficial artery occlusions near the ostium in both legs with reconsitution in the adductor canal in the distal SFA bilaterally. Right ABI was 0.71 and left ABI is 0.67. She had normal carotid artery dopplers 2/12 while in [facility]. In regards to her PAD,  I did not think an invasive workup was necessary. I did not think that a percutaneous procedure would be beneficial as the long segment of occlusion would have a low patency rate with stenting. Lower extremity bypass surgery was not indicated with absence of limb ischemia, rest pain or ulcerations. I continued ASA and started Pletal 50 mg po BID. I saw her on 07/15/11 for PV follow up and her lower extremity disease seemed to be clinically stable.  She did c/o palpitations with some chest discomfort and SOB so I arranged a 48 hour Holter monitor. Her monitor showed atrial fibrillation with rates 140bpm. Non-invasive testing today with ABI of 0.58 on the right and 0.51 on the left.   She is here today for follow up. She has been doing well with coumadin.   Primary Care Physician: Birdie Sons  Last Lipid Profile:  Lipid Panel     Component Value Date/Time   CHOL 186 06/02/2011 1100   TRIG 167.0* 06/02/2011 1100   HDL 51.10 06/02/2011 1100   CHOLHDL 4 06/02/2011 1100   VLDL 33.4 06/02/2011 1100   LDLCALC 102* 06/02/2011 1100     Past Medical History  Diagnosis Date  . HYPERLIPIDEMIA 01/06/2007  . HYPERTENSION 01/06/2007  . COPD 01/06/2007  . DIABETES MELLITUS, TYPE II 01/17/2007  . ANXIETY 11/21/2008  . SLEEP APNEA 11/21/2008  . DIVERTICULITIS OF COLON 07/23/2009  . Fistula   . GERD 01/06/2007  . PAD (peripheral artery  disease)   . CAD (coronary artery disease)     Mild by cath in remote past    Past Surgical History  Procedure Date  . Temporal artery biopsy / ligation   . Cataract extraction   . Cholecystectomy   . Abdominal hysterectomy   . Appendectomy   . Partial colectomy     diverticulitis  . Colovaginal fistula repair     after partial colectomy  . Tubal ligation   . Gallbladder surgery   . Tonsillectomy 78 YEARS OLD    Current Outpatient Prescriptions  Medication Sig Dispense Refill  . ALPRAZolam (XANAX) 1 MG tablet Take 1 tablet (1 mg total) by mouth at bedtime as needed.  30 tablet  3  . amLODipine (NORVASC) 10 MG tablet TAKE 1 TABLET BY MOUTH EVERY DAY  30 tablet  5  . aspirin 81 MG tablet Take 1 tablet (81 mg total) by mouth daily.  30 tablet  0  . cilostazol (PLETAL) 50 MG tablet Take 1 tablet (50 mg total) by mouth 2 (two) times daily.  60 tablet  11  . [employer] IRON 325 (65 FE) MG tablet TAKE 1 TABLET BY MOUTH 2 TIMES A DAY  180 tablet  3  . diltiazem (CARDIZEM CD) 120 MG 24 hr capsule Take 1 capsule (120 mg total) by mouth daily.  30 capsule  6  . fluticasone (FLONASE) 50 MCG/ACT nasal spray  2 sprays by Nasal route daily.  16 g  11  . furosemide (LASIX) 20 MG tablet Take 1 tablet (20 mg total) by mouth daily.  30 tablet  6  . HYDROcodone-acetaminophen (NORCO) 5-325 MG per tablet 1 po qd prn for pain, not to exceed 5 per week  25 tablet  2  . lisinopril (PRINIVIL,ZESTRIL) 10 MG tablet Take 1 tablet (10 mg total) by mouth daily.  30 tablet  11  . omeprazole-sodium bicarbonate (ZEGERID) 40-1100 MG per capsule Take 1 capsule by mouth every morning before breakfast.        . VOLTAREN 1 % GEL APPLY TWICE A DAY TO SHOULDER AS NEEDED FOR PAIN  100 g  2  . warfarin (COUMADIN) 5 MG tablet Take 1 tablet (5 mg total) by mouth daily.  30 tablet  0    Allergies  Allergen Reactions  . Hydroxyzine Hcl     REACTION: Makes feel like does not want to live    History   Social History  . Marital  Status: Widowed    Spouse Name: N/A    Number of Children: N/A  . Years of Education: N/A   Occupational History  . Not on file.   Social History Main Topics  . Smoking status: Current Everyday Smoker -- 1.5 packs/day    Types: Cigarettes  . Smokeless tobacco: Not on file  . Alcohol Use: No  . Drug Use: No  . Sexually Active: Not on file   Other Topics Concern  . Not on file   Social History Narrative  . No narrative on file    Family History  Problem Relation Age of Onset  . Heart disease Father   . Asthma Father   . Diabetes Daughter   . Colon cancer Son   . Cancer Mother     Review of Systems:  As stated in the HPI and otherwise negative.   BP 133/77  Pulse 101  Ht 5\' 4"  (1.626 m)  Wt 155 lb (70.308 kg)  BMI 26.61 kg/m2  Physical Examination: General: Well developed, well nourished, NAD HEENT: OP clear, mucus membranes moist SKIN: warm, dry. No rashes. Neuro: No focal deficits Musculoskeletal: Muscle strength 5/5 all ext Psychiatric: Mood and affect normal Neck: No JVD, no carotid bruits, no thyromegaly, no lymphadenopathy. Lungs:Clear bilaterally, no wheezes, rhonci, crackles Cardiovascular: Regular rate and rhythm. No murmurs, gallops or rubs. Abdomen:Soft. Bowel sounds present. Non-tender.  Extremities: No lower extremity edema. Pulses are 2 + in the bilateral DP/PT.  EKG:   48 hour Holter monitor: 07/28/11:  Sinus rhythm periods of atrial fibrillation

## 2011-08-11 ENCOUNTER — Ambulatory Visit (INDEPENDENT_AMBULATORY_CARE_PROVIDER_SITE_OTHER): Payer: Medicare Other

## 2011-08-11 DIAGNOSIS — I4891 Unspecified atrial fibrillation: Secondary | ICD-10-CM

## 2011-08-11 MED ORDER — WARFARIN SODIUM 1 MG PO TABS: 1.0000 mg | ORAL_TABLET | Freq: Every day | ORAL | Status: DC

## 2011-08-12 ENCOUNTER — Other Ambulatory Visit: Payer: Self-pay | Admitting: Internal Medicine

## 2011-08-12 ENCOUNTER — Other Ambulatory Visit: Payer: Self-pay | Admitting: *Deleted

## 2011-08-12 MED ORDER — HYDROCODONE-ACETAMINOPHEN 5-325 MG PO TABS: ORAL_TABLET | ORAL | Status: DC

## 2011-08-18 ENCOUNTER — Ambulatory Visit (INDEPENDENT_AMBULATORY_CARE_PROVIDER_SITE_OTHER): Payer: Medicare Other

## 2011-08-18 DIAGNOSIS — I4891 Unspecified atrial fibrillation: Secondary | ICD-10-CM

## 2011-08-27 ENCOUNTER — Ambulatory Visit (INDEPENDENT_AMBULATORY_CARE_PROVIDER_SITE_OTHER): Payer: Medicare Other | Admitting: Pharmacist

## 2011-08-27 DIAGNOSIS — I4891 Unspecified atrial fibrillation: Secondary | ICD-10-CM

## 2011-08-27 LAB — POCT INR: INR: 1.5

## 2011-09-03 ENCOUNTER — Ambulatory Visit (INDEPENDENT_AMBULATORY_CARE_PROVIDER_SITE_OTHER): Payer: Medicare Other | Admitting: *Deleted

## 2011-09-03 DIAGNOSIS — I4891 Unspecified atrial fibrillation: Secondary | ICD-10-CM

## 2011-09-03 LAB — POCT INR: INR: 1.6

## 2011-09-10 ENCOUNTER — Ambulatory Visit (INDEPENDENT_AMBULATORY_CARE_PROVIDER_SITE_OTHER): Payer: Medicare Other | Admitting: *Deleted

## 2011-09-10 DIAGNOSIS — I4891 Unspecified atrial fibrillation: Secondary | ICD-10-CM

## 2011-09-10 LAB — POCT INR: INR: 1.7

## 2011-09-15 ENCOUNTER — Other Ambulatory Visit: Payer: Self-pay | Admitting: *Deleted

## 2011-09-15 ENCOUNTER — Ambulatory Visit (INDEPENDENT_AMBULATORY_CARE_PROVIDER_SITE_OTHER): Payer: Medicare Other | Admitting: Pharmacist

## 2011-09-15 ENCOUNTER — Encounter: Payer: Self-pay | Admitting: Cardiovascular Disease

## 2011-09-15 ENCOUNTER — Ambulatory Visit (INDEPENDENT_AMBULATORY_CARE_PROVIDER_SITE_OTHER): Payer: Medicare Other | Admitting: Cardiovascular Disease

## 2011-09-15 VITALS — BP 132/60 | HR 114 | Ht 64.0 in | Wt 157.0 lb

## 2011-09-15 DIAGNOSIS — I4891 Unspecified atrial fibrillation: Secondary | ICD-10-CM

## 2011-09-15 DIAGNOSIS — G459 Transient cerebral ischemic attack, unspecified: Secondary | ICD-10-CM

## 2011-09-15 DIAGNOSIS — I5030 Unspecified diastolic (congestive) heart failure: Secondary | ICD-10-CM

## 2011-09-15 DIAGNOSIS — I509 Heart failure, unspecified: Secondary | ICD-10-CM

## 2011-09-15 DIAGNOSIS — I739 Peripheral vascular disease, unspecified: Secondary | ICD-10-CM

## 2011-09-15 DIAGNOSIS — R609 Edema, unspecified: Secondary | ICD-10-CM

## 2011-09-15 DIAGNOSIS — R600 Localized edema: Secondary | ICD-10-CM

## 2011-09-15 DIAGNOSIS — R002 Palpitations: Secondary | ICD-10-CM

## 2011-09-15 LAB — BASIC METABOLIC PANEL
CO2: 27 mEq/L (ref 19–32)
Chloride: 97 mEq/L (ref 96–112)
Creatinine, Ser: 0.9 mg/dL (ref 0.4–1.2)
Potassium: 3.2 mEq/L — ABNORMAL LOW (ref 3.5–5.1)
Sodium: 137 mEq/L (ref 135–145)

## 2011-09-15 LAB — POCT INR: INR: 1.9

## 2011-09-15 MED ORDER — DILTIAZEM HCL ER COATED BEADS 360 MG PO CP24: 360.0000 mg | ORAL_CAPSULE | Freq: Every day | ORAL | Status: DC

## 2011-09-15 MED ORDER — FUROSEMIDE 40 MG PO TABS: 40.0000 mg | ORAL_TABLET | Freq: Every day | ORAL | Status: DC

## 2011-09-15 MED ORDER — POTASSIUM CHLORIDE CRYS ER 20 MEQ PO TBCR: EXTENDED_RELEASE_TABLET | ORAL | Status: DC

## 2011-09-15 NOTE — Assessment & Plan Note (Signed)
Will increase Cardizem to 360 mg po Qdaily for better rate control.  Continue coumadin.

## 2011-09-15 NOTE — Assessment & Plan Note (Signed)
She has evidence of CHF with lower ext edema with normal LV systolic function. Will increase Lasix to 40 mg po BID for one week then 40 mg po once daily. Check BMET today.

## 2011-09-15 NOTE — Patient Instructions (Signed)
Your physician recommends that you schedule a follow-up appointment in: 3-4 weeks.   Your physician has recommended you make the following change in your medication: Increase furosemide to 40 mg by mouth twice daily for 1 week and then decrease to 40 mg by mouth daily. Increase Cardizem CD to 360 mg by mouth daily.  Call us next week to let us know how your swelling is doing.  [phone]

## 2011-09-15 NOTE — Assessment & Plan Note (Signed)
Stable No changes 

## 2011-09-15 NOTE — Progress Notes (Signed)
History of Present Illness: 78 yo WF with history of tobacco abuse, HLD, HTN, TIA, mild COPD, borderline DM, mild CAD by remote cath, OSA, GERD and PAD here today for cardiac follow up. I have followed her in the past for her PAD. She was referred for lower extremity arterial dopplers on 02/05/11 which showed bilateral superficial artery occlusions near the ostium in both legs with reconsitution in the adductor canal in the distal SFA bilaterally. Right ABI was 0.71 and left ABI is 0.67. She had normal carotid artery dopplers 2/12 while in [facility]. In regards to her PAD, I did not think an invasive workup was necessary. I did not think that a percutaneous procedure would be beneficial as the long segment of occlusion would have a low patency rate with stenting. Lower extremity bypass surgery was not indicated with absence of limb ischemia, rest pain or ulcerations. I continued ASA and started Pletal 50 mg po BID. I saw her on 07/15/11 for PV follow up and her lower extremity disease seemed to be clinically stable. She did c/o palpitations with some chest discomfort and SOB so I arranged a 48 hour Holter monitor. Her monitor showed atrial fibrillation with rates 140bpm. Non-invasive testing today with ABI of 0.58 on the right and 0.51 on the left.   She is here today for follow up. She has been doing well with coumadin.    Primary Care Physician:  Last Lipid Profile:  Past Medical History  Diagnosis Date  . HYPERLIPIDEMIA 01/06/2007  . HYPERTENSION 01/06/2007  . COPD 01/06/2007  . DIABETES MELLITUS, TYPE II 01/17/2007  . ANXIETY 11/21/2008  . SLEEP APNEA 11/21/2008  . DIVERTICULITIS OF COLON 07/23/2009  . Fistula   . GERD 01/06/2007  . PAD (peripheral artery disease)   . CAD (coronary artery disease)     Mild by cath in remote past  . Atrial fibrillation     Past Surgical History  Procedure Date  . Temporal artery biopsy / ligation   . Cataract extraction   . Cholecystectomy   .  Abdominal hysterectomy   . Appendectomy   . Partial colectomy     diverticulitis  . Colovaginal fistula repair     after partial colectomy  . Tubal ligation   . Gallbladder surgery   . Tonsillectomy 78 YEARS OLD    Current Outpatient Prescriptions  Medication Sig Dispense Refill  . ALPRAZolam (XANAX) 1 MG tablet Take 1 tablet (1 mg total) by mouth at bedtime as needed.  30 tablet  3  . amLODipine (NORVASC) 10 MG tablet TAKE 1 TABLET BY MOUTH EVERY DAY  30 tablet  5  . aspirin 81 MG tablet Take 1 tablet (81 mg total) by mouth daily.  30 tablet  0  . cilostazol (PLETAL) 50 MG tablet Take 1 tablet (50 mg total) by mouth 2 (two) times daily.  60 tablet  11  . CVS IRON 325 (65 FE) MG tablet TAKE 1 TABLET BY MOUTH 2 TIMES A DAY  180 tablet  3  . diltiazem (CARDIZEM CD) 240 MG 24 hr capsule Take 1 capsule (240 mg total) by mouth daily.  30 capsule  6  . fluticasone (FLONASE) 50 MCG/ACT nasal spray USE 2 SPRAYS IN NOSE DAILY AS DIRECTED  16 g  5  . furosemide (LASIX) 20 MG tablet Take 1 tablet (20 mg total) by mouth daily.  30 tablet  6  . HYDROcodone-acetaminophen (NORCO) 5-325 MG per tablet 1 po qd prn for  pain, not to exceed 5 per week  25 tablet  2  . lisinopril (PRINIVIL,ZESTRIL) 10 MG tablet Take 1 tablet (10 mg total) by mouth daily.  30 tablet  11  . omeprazole-sodium bicarbonate (ZEGERID) 40-1100 MG per capsule Take 1 capsule by mouth every morning before breakfast.        . VOLTAREN 1 % GEL APPLY TWICE A DAY TO SHOULDER AS NEEDED FOR PAIN  100 g  2  . warfarin (COUMADIN) 1 MG tablet Take 1 tablet (1 mg total) by mouth daily.  50 tablet  1    Allergies  Allergen Reactions  . Hydroxyzine Hcl     REACTION: Makes feel like does not want to live    History   Social History  . Marital Status: Widowed    Spouse Name: N/A    Number of Children: N/A  . Years of Education: N/A   Occupational History  . Not on file.   Social History Main Topics  . Smoking status: Current Everyday  Smoker -- 1.5 packs/day    Types: Cigarettes  . Smokeless tobacco: Not on file  . Alcohol Use: No  . Drug Use: No  . Sexually Active: Not on file   Other Topics Concern  . Not on file   Social History Narrative  . No narrative on file    Family History  Problem Relation Age of Onset  . Heart disease Father   . Asthma Father   . Diabetes Daughter   . Colon cancer Son   . Cancer Mother     Review of Systems:  As stated in the HPI and otherwise negative.   BP 132/60  Pulse 114  Ht 5\' 4"  (1.626 m)  Wt 157 lb (71.215 kg)  BMI 26.95 kg/m2  Physical Examination: General: Well developed, well nourished, NAD HEENT: OP clear, mucus membranes moist SKIN: warm, dry. No rashes. Neuro: No focal deficits Musculoskeletal: Muscle strength 5/5 all ext Psychiatric: Mood and affect normal Neck: No JVD, no carotid bruits, no thyromegaly, no lymphadenopathy. Lungs:Clear bilaterally, no wheezes, rhonci, crackles Cardiovascular: Regular rate and rhythm. No murmurs, gallops or rubs. Abdomen:Soft. Bowel sounds present. Non-tender.  Extremities: No lower extremity edema. Pulses are 2 + in the bilateral DP/PT.  EKG:

## 2011-09-16 ENCOUNTER — Other Ambulatory Visit: Payer: Self-pay | Admitting: *Deleted

## 2011-09-16 ENCOUNTER — Other Ambulatory Visit: Payer: Self-pay | Admitting: Pharmacist

## 2011-09-16 DIAGNOSIS — I4891 Unspecified atrial fibrillation: Secondary | ICD-10-CM

## 2011-09-16 DIAGNOSIS — I5030 Unspecified diastolic (congestive) heart failure: Secondary | ICD-10-CM

## 2011-09-16 DIAGNOSIS — G459 Transient cerebral ischemic attack, unspecified: Secondary | ICD-10-CM

## 2011-09-16 DIAGNOSIS — Z86718 Personal history of other venous thrombosis and embolism: Secondary | ICD-10-CM

## 2011-09-16 MED ORDER — WARFARIN SODIUM 1 MG PO TABS: ORAL_TABLET | ORAL | Status: DC

## 2011-09-16 NOTE — Addendum Note (Signed)
Addended by: Dossie Arbour on: 09/16/2011 02:16 PM   Modules accepted: Orders

## 2011-09-17 ENCOUNTER — Telehealth: Payer: Self-pay | Admitting: Cardiovascular Disease

## 2011-09-22 ENCOUNTER — Other Ambulatory Visit: Payer: Medicare Other

## 2011-09-22 ENCOUNTER — Other Ambulatory Visit: Payer: Self-pay | Admitting: *Deleted

## 2011-09-22 MED ORDER — ALPRAZOLAM 1 MG PO TABS: 1.0000 mg | ORAL_TABLET | Freq: Every evening | ORAL | Status: DC | PRN

## 2011-09-23 ENCOUNTER — Other Ambulatory Visit (INDEPENDENT_AMBULATORY_CARE_PROVIDER_SITE_OTHER): Payer: Medicare Other

## 2011-09-23 ENCOUNTER — Ambulatory Visit (INDEPENDENT_AMBULATORY_CARE_PROVIDER_SITE_OTHER): Payer: Medicare Other | Admitting: *Deleted

## 2011-09-23 ENCOUNTER — Other Ambulatory Visit: Payer: Medicare Other

## 2011-09-23 DIAGNOSIS — G459 Transient cerebral ischemic attack, unspecified: Secondary | ICD-10-CM

## 2011-09-23 DIAGNOSIS — I5030 Unspecified diastolic (congestive) heart failure: Secondary | ICD-10-CM

## 2011-09-23 DIAGNOSIS — I4891 Unspecified atrial fibrillation: Secondary | ICD-10-CM

## 2011-09-23 LAB — BASIC METABOLIC PANEL
BUN: 19 mg/dL (ref 6–23)
Calcium: 9.9 mg/dL (ref 8.4–10.5)
Creatinine, Ser: 1.6 mg/dL — ABNORMAL HIGH (ref 0.4–1.2)
GFR: 33.63 mL/min — ABNORMAL LOW (ref >60.00)
Potassium: 4 mEq/L (ref 3.5–5.1)

## 2011-09-25 ENCOUNTER — Other Ambulatory Visit: Payer: Self-pay | Admitting: *Deleted

## 2011-09-25 DIAGNOSIS — I5030 Unspecified diastolic (congestive) heart failure: Secondary | ICD-10-CM

## 2011-10-02 ENCOUNTER — Other Ambulatory Visit (INDEPENDENT_AMBULATORY_CARE_PROVIDER_SITE_OTHER): Payer: Medicare Other

## 2011-10-02 ENCOUNTER — Ambulatory Visit (INDEPENDENT_AMBULATORY_CARE_PROVIDER_SITE_OTHER): Payer: Medicare Other

## 2011-10-02 DIAGNOSIS — I10 Essential (primary) hypertension: Secondary | ICD-10-CM

## 2011-10-02 DIAGNOSIS — I5030 Unspecified diastolic (congestive) heart failure: Secondary | ICD-10-CM

## 2011-10-02 DIAGNOSIS — I4891 Unspecified atrial fibrillation: Secondary | ICD-10-CM

## 2011-10-02 DIAGNOSIS — I2510 Atherosclerotic heart disease of native coronary artery without angina pectoris: Secondary | ICD-10-CM

## 2011-10-02 DIAGNOSIS — G459 Transient cerebral ischemic attack, unspecified: Secondary | ICD-10-CM

## 2011-10-02 DIAGNOSIS — E785 Hyperlipidemia, unspecified: Secondary | ICD-10-CM

## 2011-10-02 LAB — POCT INR: INR: 1.9

## 2011-10-03 LAB — BASIC METABOLIC PANEL
Calcium: 9.9 mg/dL (ref 8.4–10.5)
Creat: 0.99 mg/dL (ref 0.50–1.10)

## 2011-10-05 ENCOUNTER — Other Ambulatory Visit: Payer: Self-pay | Admitting: *Deleted

## 2011-10-05 DIAGNOSIS — I5030 Unspecified diastolic (congestive) heart failure: Secondary | ICD-10-CM

## 2011-10-05 MED ORDER — POTASSIUM CHLORIDE CRYS ER 20 MEQ PO TBCR: 20.0000 meq | EXTENDED_RELEASE_TABLET | Freq: Every day | ORAL | Status: DC

## 2011-10-16 ENCOUNTER — Encounter: Payer: Self-pay | Admitting: Cardiovascular Disease

## 2011-10-16 ENCOUNTER — Ambulatory Visit (INDEPENDENT_AMBULATORY_CARE_PROVIDER_SITE_OTHER): Payer: Medicare Other | Admitting: Cardiovascular Disease

## 2011-10-16 ENCOUNTER — Ambulatory Visit (INDEPENDENT_AMBULATORY_CARE_PROVIDER_SITE_OTHER): Payer: Medicare Other | Admitting: *Deleted

## 2011-10-16 VITALS — BP 113/55 | HR 72 | Ht 63.0 in | Wt 157.0 lb

## 2011-10-16 DIAGNOSIS — I4891 Unspecified atrial fibrillation: Secondary | ICD-10-CM

## 2011-10-16 DIAGNOSIS — I5030 Unspecified diastolic (congestive) heart failure: Secondary | ICD-10-CM

## 2011-10-16 DIAGNOSIS — I739 Peripheral vascular disease, unspecified: Secondary | ICD-10-CM

## 2011-10-16 DIAGNOSIS — I509 Heart failure, unspecified: Secondary | ICD-10-CM

## 2011-10-16 DIAGNOSIS — G459 Transient cerebral ischemic attack, unspecified: Secondary | ICD-10-CM

## 2011-10-16 LAB — POCT INR: INR: 1.9

## 2011-10-16 NOTE — Patient Instructions (Addendum)
You are scheduled for a follow up appt with Dr. Clifton James on February 16, 2012 at 10:30

## 2011-10-16 NOTE — Assessment & Plan Note (Signed)
She is in normal sinus today. Will continue Cardizem and coumadin. Long discussion in regards to fall risk on coumadin. She wishes to continue the coumadin and limit risk of stroke. Continue in coumadin clinic.

## 2011-10-16 NOTE — Assessment & Plan Note (Signed)
Stable. Known to have severe bilateral lower ext arterial disease with bilateral SFA ostial occlusions. NO rest pain or ulcerations. Continue conservative management.

## 2011-10-16 NOTE — Progress Notes (Signed)
History of Present Illness: 78 yo WF with history of tobacco abuse, HLD, HTN, TIA, mild COPD, borderline DM, mild CAD by remote cath, OSA, GERD and PAD here today for cardiac follow up. I have followed her in the past for her PAD. She was referred for lower extremity arterial dopplers on 02/05/11 which showed bilateral superficial artery occlusions near the ostium in both legs with reconsitution in the adductor canal in the distal SFA bilaterally. Right ABI was 0.71 and left ABI is 0.67. She had normal carotid artery dopplers 2/12 while in [facility]. In regards to her PAD, I did not think an invasive workup was necessary. I did not think that a percutaneous procedure would be beneficial as the long segment of occlusion would have a low patency rate with stenting. Lower extremity bypass surgery was not indicated with absence of limb ischemia, rest pain or ulcerations. I continued ASA and started Pletal 50 mg po BID. I saw her on 07/15/11 for PV follow up and her lower extremity disease seemed to be clinically stable. She did c/o palpitations with some chest discomfort and SOB so I arranged a 48 hour Holter monitor. Her monitor showed atrial fibrillation with rates 140bpm. Non-invasive testing June 2013 with ABI of 0.58 on the right and 0.51 on the left. She was started on coumadin.   She is here today for follow up. She has been doing well but had a few falls where she lost her balance. She hurt her right shoulder. No chest pain or SOB. No bleeding on coumadin. She does have back pain. No palpitations. She is planning to see Dr. Jeral Fruit with Neurosurgery today.   Primary Care Physician: Swords  Past Medical History  Diagnosis Date  . HYPERLIPIDEMIA 01/06/2007  . HYPERTENSION 01/06/2007  . COPD 01/06/2007  . DIABETES MELLITUS, TYPE II 01/17/2007  . ANXIETY 11/21/2008  . SLEEP APNEA 11/21/2008  . DIVERTICULITIS OF COLON 07/23/2009  . Fistula   . GERD 01/06/2007  . PAD (peripheral artery disease)     . CAD (coronary artery disease)     Mild by cath in remote past  . Atrial fibrillation     Past Surgical History  Procedure Date  . Temporal artery biopsy / ligation   . Cataract extraction   . Cholecystectomy   . Abdominal hysterectomy   . Appendectomy   . Partial colectomy     diverticulitis  . Colovaginal fistula repair     after partial colectomy  . Tubal ligation   . Gallbladder surgery   . Tonsillectomy 78 YEARS OLD    Current Outpatient Prescriptions  Medication Sig Dispense Refill  . ALPRAZolam (XANAX) 1 MG tablet Take 1 tablet (1 mg total) by mouth at bedtime as needed.  30 tablet  2  . amLODipine (NORVASC) 10 MG tablet TAKE 1 TABLET BY MOUTH EVERY DAY  30 tablet  5  . aspirin 81 MG tablet Take 1 tablet (81 mg total) by mouth daily.  30 tablet  0  . cilostazol (PLETAL) 50 MG tablet Take 1 tablet (50 mg total) by mouth 2 (two) times daily.  60 tablet  11  . CVS IRON 325 (65 FE) MG tablet TAKE 1 TABLET BY MOUTH 2 TIMES A DAY  180 tablet  3  . diltiazem (CARDIZEM CD) 360 MG 24 hr capsule Take 1 capsule (360 mg total) by mouth daily.  30 capsule  6  . fluticasone (FLONASE) 50 MCG/ACT nasal spray USE 2 SPRAYS IN NOSE  DAILY AS DIRECTED  16 g  5  . furosemide (LASIX) 40 MG tablet Take 1 tablet (40 mg total) by mouth daily.  30 tablet  6  . HYDROcodone-acetaminophen (NORCO) 5-325 MG per tablet 1 po qd prn for pain, not to exceed 5 per week  25 tablet  2  . lisinopril (PRINIVIL,ZESTRIL) 10 MG tablet Take 1 tablet (10 mg total) by mouth daily.  30 tablet  11  . omeprazole-sodium bicarbonate (ZEGERID) 40-1100 MG per capsule Take 1 capsule by mouth every morning before breakfast.        . potassium chloride SA (K-DUR,KLOR-CON) 20 MEQ tablet Take 1 tablet (20 mEq total) by mouth daily.  30 tablet  3  . VOLTAREN 1 % GEL APPLY TWICE A DAY TO SHOULDER AS NEEDED FOR PAIN  100 g  2  . warfarin (COUMADIN) 1 MG tablet Take as directed by the Coumadin Clinic.  50 tablet  3    Allergies   Allergen Reactions  . Hydroxyzine Hcl     REACTION: Makes feel like does not want to live    History   Social History  . Marital Status: Widowed    Spouse Name: N/A    Number of Children: N/A  . Years of Education: N/A   Occupational History  . Not on file.   Social History Main Topics  . Smoking status: Current Everyday Smoker -- 1.5 packs/day    Types: Cigarettes  . Smokeless tobacco: Not on file  . Alcohol Use: No  . Drug Use: No  . Sexually Active: Not on file   Other Topics Concern  . Not on file   Social History Narrative  . No narrative on file    Family History  Problem Relation Age of Onset  . Heart disease Father   . Asthma Father   . Diabetes Daughter   . Colon cancer Son   . Cancer Mother     Review of Systems:  As stated in the HPI and otherwise negative.   BP 113/55  Pulse 72  Ht 5\' 3"  (1.6 m)  Wt 157 lb (71.215 kg)  BMI 27.81 kg/m2  Physical Examination: General: Well developed, well nourished, NAD HEENT: OP clear, mucus membranes moist SKIN: warm, dry. No rashes. Neuro: No focal deficits Musculoskeletal: Muscle strength 5/5 all ext Psychiatric: Mood and affect normal Neck: No JVD, no carotid bruits, no thyromegaly, no lymphadenopathy. Lungs:Clear bilaterally, no wheezes, rhonci, crackles Cardiovascular: Regular rate and rhythm. No murmurs, gallops or rubs. Abdomen:Soft. Bowel sounds present. Non-tender.  Extremities: 1+ bilateral  lower extremity edema. Pulses are non-palpable in the bilateral DP/PT.  EKG: NSR, rate 71 bpm. Non-specific T wave abnormalities. QTc .

## 2011-10-16 NOTE — Assessment & Plan Note (Signed)
Volume is ok. Mild lower ext edema. Continue Lasix.

## 2011-10-20 ENCOUNTER — Telehealth: Payer: Self-pay | Admitting: Cardiovascular Disease

## 2011-10-20 NOTE — Telephone Encounter (Signed)
Spoke with daughter and confirmed fax number below. I told her I would send last office visit note, EKG, echo report, holter report, ABI's and most recent lab work.

## 2011-10-20 NOTE — Telephone Encounter (Signed)
Pt's daughter calling re info that was to be faxed to another dr this am for an appt at 1100a,  [phone] [facility] phone number, fax number [phone]

## 2011-10-21 ENCOUNTER — Other Ambulatory Visit: Payer: Self-pay | Admitting: Internal Medicine

## 2011-10-22 ENCOUNTER — Ambulatory Visit (INDEPENDENT_AMBULATORY_CARE_PROVIDER_SITE_OTHER): Payer: Medicare Other | Admitting: *Deleted

## 2011-10-22 DIAGNOSIS — I4891 Unspecified atrial fibrillation: Secondary | ICD-10-CM

## 2011-10-22 DIAGNOSIS — G459 Transient cerebral ischemic attack, unspecified: Secondary | ICD-10-CM

## 2011-10-22 LAB — POCT INR: INR: 2

## 2011-11-05 ENCOUNTER — Other Ambulatory Visit: Payer: Self-pay | Admitting: Neurosurgery

## 2011-11-05 DIAGNOSIS — M549 Dorsalgia, unspecified: Secondary | ICD-10-CM

## 2011-11-09 ENCOUNTER — Ambulatory Visit (INDEPENDENT_AMBULATORY_CARE_PROVIDER_SITE_OTHER): Payer: Medicare Other

## 2011-11-09 DIAGNOSIS — G459 Transient cerebral ischemic attack, unspecified: Secondary | ICD-10-CM

## 2011-11-09 DIAGNOSIS — I4891 Unspecified atrial fibrillation: Secondary | ICD-10-CM

## 2011-11-10 ENCOUNTER — Ambulatory Visit
Admission: RE | Admit: 2011-11-10 | Discharge: 2011-11-10 | Disposition: A | Discharge status: Home / Self Care | Payer: Medicare Other | Source: Ambulatory Visit | Attending: Neurosurgery | Admitting: Neurosurgery

## 2011-11-10 VITALS — BP 114/57 | HR 72

## 2011-11-10 DIAGNOSIS — M549 Dorsalgia, unspecified: Secondary | ICD-10-CM

## 2011-11-10 MED ORDER — IOHEXOL 180 MG/ML  SOLN
15.0000 mL | Freq: Once | INTRAMUSCULAR | Status: AC | PRN
  Administered 2011-11-10: 15 mL via INTRATHECAL

## 2011-11-10 MED ORDER — MORPHINE SULFATE 4 MG/ML IJ SOLN
5.0000 mg | Freq: Once | INTRAMUSCULAR | Status: AC
  Administered 2011-11-10: 5 mg via INTRAMUSCULAR

## 2011-11-10 MED ORDER — ONDANSETRON HCL 4 MG/2ML IJ SOLN
4.0000 mg | Freq: Once | INTRAMUSCULAR | Status: AC
  Administered 2011-11-10: 4 mg via INTRAMUSCULAR

## 2011-11-10 MED ORDER — ONDANSETRON HCL 4 MG/2ML IJ SOLN: 4.0000 mg | Freq: Four times a day (QID) | INTRAMUSCULAR | Status: DC | PRN

## 2011-11-10 MED ORDER — DIAZEPAM 5 MG PO TABS: 5.0000 mg | ORAL_TABLET | Freq: Once | ORAL | Status: DC

## 2011-11-10 NOTE — Progress Notes (Signed)
Patient states she has been off Warfarin for the past four days and Remeron the past two days.  Discharge instructions explained to patient.  Larina Earthly, RN

## 2011-11-18 ENCOUNTER — Ambulatory Visit (INDEPENDENT_AMBULATORY_CARE_PROVIDER_SITE_OTHER): Payer: Medicare Other | Admitting: *Deleted

## 2011-11-18 DIAGNOSIS — G459 Transient cerebral ischemic attack, unspecified: Secondary | ICD-10-CM

## 2011-11-18 DIAGNOSIS — I4891 Unspecified atrial fibrillation: Secondary | ICD-10-CM

## 2011-11-18 LAB — POCT INR: INR: 2.1

## 2011-12-02 ENCOUNTER — Ambulatory Visit (INDEPENDENT_AMBULATORY_CARE_PROVIDER_SITE_OTHER): Payer: Medicare Other | Admitting: *Deleted

## 2011-12-02 DIAGNOSIS — G459 Transient cerebral ischemic attack, unspecified: Secondary | ICD-10-CM

## 2011-12-02 DIAGNOSIS — I4891 Unspecified atrial fibrillation: Secondary | ICD-10-CM

## 2011-12-02 LAB — POCT INR: INR: 3.5

## 2011-12-17 ENCOUNTER — Other Ambulatory Visit: Payer: Self-pay | Admitting: Internal Medicine

## 2011-12-22 ENCOUNTER — Ambulatory Visit (INDEPENDENT_AMBULATORY_CARE_PROVIDER_SITE_OTHER): Payer: Medicare Other | Admitting: *Deleted

## 2011-12-22 DIAGNOSIS — I4891 Unspecified atrial fibrillation: Secondary | ICD-10-CM

## 2011-12-22 DIAGNOSIS — I10 Essential (primary) hypertension: Secondary | ICD-10-CM

## 2011-12-22 DIAGNOSIS — G459 Transient cerebral ischemic attack, unspecified: Secondary | ICD-10-CM

## 2011-12-22 MED ORDER — LISINOPRIL 10 MG PO TABS: 20.0000 mg | ORAL_TABLET | Freq: Every day | ORAL | Status: DC

## 2011-12-24 ENCOUNTER — Other Ambulatory Visit: Payer: Self-pay | Admitting: Cardiovascular Disease

## 2012-01-07 ENCOUNTER — Telehealth: Payer: Self-pay | Admitting: Cardiovascular Disease

## 2012-01-07 NOTE — Telephone Encounter (Signed)
Left message to call back  

## 2012-01-07 NOTE — Telephone Encounter (Signed)
Pt is having a back injection on the 15th and needs to stop her coumadin 5 days prior

## 2012-01-07 NOTE — Telephone Encounter (Signed)
OK to hold coumadin for the procedure. cdm

## 2012-01-08 NOTE — Telephone Encounter (Signed)
Spoke with son and gave him information from Dr. Clifton James. Will fax this note to [facility] pain clinic.  Phone number for clinic is [phone]. Fax is [phone]

## 2012-01-19 ENCOUNTER — Ambulatory Visit (INDEPENDENT_AMBULATORY_CARE_PROVIDER_SITE_OTHER): Payer: Medicare Other

## 2012-01-19 DIAGNOSIS — I4891 Unspecified atrial fibrillation: Secondary | ICD-10-CM

## 2012-01-19 DIAGNOSIS — G459 Transient cerebral ischemic attack, unspecified: Secondary | ICD-10-CM

## 2012-01-29 ENCOUNTER — Ambulatory Visit (INDEPENDENT_AMBULATORY_CARE_PROVIDER_SITE_OTHER): Payer: Medicare Other | Admitting: Pharmacist

## 2012-01-29 DIAGNOSIS — G459 Transient cerebral ischemic attack, unspecified: Secondary | ICD-10-CM

## 2012-01-29 DIAGNOSIS — I4891 Unspecified atrial fibrillation: Secondary | ICD-10-CM

## 2012-02-02 ENCOUNTER — Emergency Department (HOSPITAL_COMMUNITY)
Admission: EM | Admit: 2012-02-02 | Discharge: 2012-02-03 | Disposition: A | Discharge status: Home / Self Care | Payer: Medicare Other | Attending: Emergency Medicine | Admitting: Emergency Medicine

## 2012-02-02 ENCOUNTER — Encounter (HOSPITAL_COMMUNITY): Payer: Self-pay | Admitting: *Deleted

## 2012-02-02 DIAGNOSIS — Z7982 Long term (current) use of aspirin: Secondary | ICD-10-CM | POA: Insufficient documentation

## 2012-02-02 DIAGNOSIS — K5732 Diverticulitis of large intestine without perforation or abscess without bleeding: Secondary | ICD-10-CM | POA: Insufficient documentation

## 2012-02-02 DIAGNOSIS — Y9389 Activity, other specified: Secondary | ICD-10-CM | POA: Insufficient documentation

## 2012-02-02 DIAGNOSIS — I739 Peripheral vascular disease, unspecified: Secondary | ICD-10-CM | POA: Insufficient documentation

## 2012-02-02 DIAGNOSIS — F17200 Nicotine dependence, unspecified, uncomplicated: Secondary | ICD-10-CM | POA: Insufficient documentation

## 2012-02-02 DIAGNOSIS — Z7901 Long term (current) use of anticoagulants: Secondary | ICD-10-CM | POA: Insufficient documentation

## 2012-02-02 DIAGNOSIS — W19XXXA Unspecified fall, initial encounter: Secondary | ICD-10-CM

## 2012-02-02 DIAGNOSIS — W1809XA Striking against other object with subsequent fall, initial encounter: Secondary | ICD-10-CM | POA: Insufficient documentation

## 2012-02-02 DIAGNOSIS — K219 Gastro-esophageal reflux disease without esophagitis: Secondary | ICD-10-CM | POA: Insufficient documentation

## 2012-02-02 DIAGNOSIS — F411 Generalized anxiety disorder: Secondary | ICD-10-CM | POA: Insufficient documentation

## 2012-02-02 DIAGNOSIS — S42293A Other displaced fracture of upper end of unspecified humerus, initial encounter for closed fracture: Secondary | ICD-10-CM | POA: Insufficient documentation

## 2012-02-02 DIAGNOSIS — S42202A Unspecified fracture of upper end of left humerus, initial encounter for closed fracture: Secondary | ICD-10-CM

## 2012-02-02 DIAGNOSIS — I10 Essential (primary) hypertension: Secondary | ICD-10-CM | POA: Insufficient documentation

## 2012-02-02 DIAGNOSIS — G4730 Sleep apnea, unspecified: Secondary | ICD-10-CM | POA: Insufficient documentation

## 2012-02-02 DIAGNOSIS — E119 Type 2 diabetes mellitus without complications: Secondary | ICD-10-CM | POA: Insufficient documentation

## 2012-02-02 DIAGNOSIS — Y9289 Other specified places as the place of occurrence of the external cause: Secondary | ICD-10-CM | POA: Insufficient documentation

## 2012-02-02 DIAGNOSIS — I2510 Atherosclerotic heart disease of native coronary artery without angina pectoris: Secondary | ICD-10-CM | POA: Insufficient documentation

## 2012-02-02 DIAGNOSIS — Z79899 Other long term (current) drug therapy: Secondary | ICD-10-CM | POA: Insufficient documentation

## 2012-02-02 DIAGNOSIS — S2239XA Fracture of one rib, unspecified side, initial encounter for closed fracture: Secondary | ICD-10-CM | POA: Insufficient documentation

## 2012-02-02 DIAGNOSIS — J449 Chronic obstructive pulmonary disease, unspecified: Secondary | ICD-10-CM | POA: Insufficient documentation

## 2012-02-02 DIAGNOSIS — E785 Hyperlipidemia, unspecified: Secondary | ICD-10-CM | POA: Insufficient documentation

## 2012-02-02 DIAGNOSIS — J4489 Other specified chronic obstructive pulmonary disease: Secondary | ICD-10-CM | POA: Insufficient documentation

## 2012-02-02 MED ORDER — FENTANYL CITRATE 0.05 MG/ML IJ SOLN
50.0000 ug | Freq: Once | INTRAMUSCULAR | Status: AC
  Administered 2012-02-03: 50 ug via INTRAMUSCULAR
  Filled 2012-02-02: qty 2

## 2012-02-02 MED ORDER — FENTANYL CITRATE 0.05 MG/ML IJ SOLN: 50.0000 ug | Freq: Once | INTRAMUSCULAR | Status: DC

## 2012-02-02 MED ORDER — OXYCODONE-ACETAMINOPHEN 5-325 MG PO TABS
1.0000 | ORAL_TABLET | Freq: Once | ORAL | Status: AC
  Administered 2012-02-02: 1 via ORAL
  Filled 2012-02-02: qty 1

## 2012-02-02 MED ORDER — ONDANSETRON 4 MG PO TBDP
4.0000 mg | ORAL_TABLET | Freq: Once | ORAL | Status: AC
  Administered 2012-02-03: 4 mg via ORAL
  Filled 2012-02-02: qty 1

## 2012-02-02 NOTE — ED Notes (Signed)
Pt fell from standing position. Pt landed on hands and left arm. Pt went to [facility] and had x-rays but they could not determine fully by x-rays if arm was broken. Pt stumbled when she was walking, denies lightheadness or dizziness.

## 2012-02-03 ENCOUNTER — Emergency Department (HOSPITAL_COMMUNITY): Payer: Medicare Other

## 2012-02-03 MED ORDER — OXYCODONE-ACETAMINOPHEN 5-325 MG PO TABS
1.0000 | ORAL_TABLET | Freq: Once | ORAL | Status: AC
  Administered 2012-02-03: 1 via ORAL
  Filled 2012-02-03: qty 1

## 2012-02-03 MED ORDER — ONDANSETRON HCL 4 MG PO TABS: 4.0000 mg | ORAL_TABLET | Freq: Four times a day (QID) | ORAL | Status: AC

## 2012-02-03 MED ORDER — OXYCODONE-ACETAMINOPHEN 5-325 MG PO TABS: 1.0000 | ORAL_TABLET | Freq: Four times a day (QID) | ORAL | Status: DC | PRN

## 2012-02-03 MED ORDER — HYDROMORPHONE HCL PF 1 MG/ML IJ SOLN
0.5000 mg | INTRAMUSCULAR | Status: AC
  Administered 2012-02-03: 0.5 mg via INTRAVENOUS
  Filled 2012-02-03: qty 1

## 2012-02-03 NOTE — ED Notes (Signed)
Ortho tech paged to apply sling and swathe; ortho tech states that he is on his way.

## 2012-02-03 NOTE — ED Notes (Signed)
Patient currently sitting up in bed; no respiratory or acute distress noted.  Patient updated on plan of care; informed patient that we are currently waiting on x-ray results.  Family present at bedside.  Patient denies any other needs at this time; will continue to monitor.

## 2012-02-03 NOTE — ED Notes (Signed)
Called respiratory therapist for incentive spirometer; respiratory therapist states that they are on their way.

## 2012-02-03 NOTE — ED Notes (Signed)
Ortho tech at bedside 

## 2012-02-03 NOTE — ED Notes (Signed)
Copy of discharge paperwork given to patient; went over discharge instructions with patient.  Patient instructed to take prescriptions as directed, to not drive or drink while taking narcotic, and to return to the ED for new, worsening, or concerning symptoms.

## 2012-02-03 NOTE — ED Notes (Signed)
Respiratory therapist at bedside explaining how to use incentive spirometer.

## 2012-02-04 NOTE — ED Provider Notes (Signed)
History     CSN: [id number]  Arrival date & time 02/02/12  2033   First MD Initiated Contact with Patient 02/02/12 2336      Chief Complaint  Patient presents with  . Fall  . Arm Pain    left    (Consider location/radiation/quality/duration/timing/severity/associated sxs/prior treatment) HPIMargaret T Cunningham is a 78 y.o. female with extensive medical history who presents with a fall.  She says this was a mechanical fall from standing while in a breezeway, fall to concrete onto left hand, left arm and left side of body.  She denies any antecedent chest pain, nausea, abdominal pain, dizziness, or vertigo. She did not hit her head, denies LOC. Currently she c/o 10/10 pain in her left upper extremity that is worsened by movement.  No paresthesias in arm.  Pt able to move left hands/fingers. She is also c/o left sided rib pain which is worse on deep breath. She has not had any medicine to help with the pain.    Past Medical History  Diagnosis Date  . HYPERLIPIDEMIA 01/06/2007  . HYPERTENSION 01/06/2007  . COPD 01/06/2007  . DIABETES MELLITUS, TYPE II 01/17/2007  . ANXIETY 11/21/2008  . SLEEP APNEA 11/21/2008  . DIVERTICULITIS OF COLON 07/23/2009  . Fistula   . GERD 01/06/2007  . PAD (peripheral artery disease)   . CAD (coronary artery disease)     Mild by cath in remote past  . Atrial fibrillation     Past Surgical History  Procedure Date  . Temporal artery biopsy / ligation   . Cataract extraction   . Cholecystectomy   . Abdominal hysterectomy   . Appendectomy   . Partial colectomy     diverticulitis  . Colovaginal fistula repair     after partial colectomy  . Tubal ligation   . Gallbladder surgery   . Tonsillectomy 78 YEARS OLD    Family History  Problem Relation Age of Onset  . Heart disease Father   . Asthma Father   . Diabetes Daughter   . Colon cancer Son   . Cancer Mother     History  Substance Use Topics  . Smoking status: Current Every Day Smoker -- 1.5  packs/day    Types: Cigarettes  . Smokeless tobacco: Not on file  . Alcohol Use: No    OB History    Grav Para Term Preterm Abortions TAB SAB Ect Mult Living                  Review of Systems At least 10pt or greater review of systems completed and are negative except where specified in the HPI.  Allergies  Hydroxyzine hcl  Home Medications   Current Outpatient Rx  Name Route Sig Dispense Refill  . ACETAMINOPHEN 500 MG PO TABS Oral Take 1,000 mg by mouth 3 (three) times daily.     . ASPIRIN 81 MG PO TABS Oral Take 1 tablet (81 mg total) by mouth daily. 30 tablet 0  . CILOSTAZOL 50 MG PO TABS Oral Take 1 tablet (50 mg total) by mouth 2 (two) times daily. 60 tablet 11  . DILTIAZEM HCL ER COATED BEADS 360 MG PO CP24 Oral Take 1 capsule (360 mg total) by mouth daily. 30 capsule 6  . FLUTICASONE PROPIONATE 50 MCG/ACT NA SUSP  USE 2 SPRAYS IN NOSE DAILY AS DIRECTED 16 g 5  . FUROSEMIDE 40 MG PO TABS Oral Take 1 tablet (40 mg total) by mouth daily. 30 tablet 6  .  GABAPENTIN 100 MG PO CAPS Oral Take 100 mg by mouth 3 (three) times daily.    Marland Kitchen LISINOPRIL 10 MG PO TABS Oral Take 2 tablets (20 mg total) by mouth daily. 30 tablet 11  . MAGNESIUM 250 MG PO TABS Oral Take 1 tablet by mouth 2 (two) times daily.    Marland Kitchen MIRTAZAPINE 15 MG PO TABS Oral Take 15 mg by mouth at bedtime.    . OMEPRAZOLE 40 MG PO CPDR Oral Take 40 mg by mouth daily.    Marland Kitchen VITAMIN D (ERGOCALCIFEROL) 50000 UNITS PO CAPS Oral Take 50,000 Units by mouth every 30 (thirty) days.    . VOLTAREN 1 % TD GEL  APPLY TWICE A DAY TO SHOULDER AS NEEDED FOR PAIN 100 g 2  . WARFARIN SODIUM 1 MG PO TABS Oral Take 1.5-2 mg by mouth daily. Take 2 tablets every night except take 1.5 tablets on Tuesday and Thursday    . ONDANSETRON HCL 4 MG PO TABS Oral Take 1 tablet (4 mg total) by mouth every 6 (six) hours. As needed for nausea or vomiting 12 tablet 0  . OXYCODONE-ACETAMINOPHEN 5-325 MG PO TABS Oral Take 1-2 tablets by mouth every 6 (six)  hours as needed (Moderate to severe pain). 17 tablet 0    BP 106/62  Pulse 72  Temp 97.2 F (36.2 C) (Oral)  Resp 20  SpO2 98%  Physical Exam  Nursing notes reviewed.  Electronic medical record reviewed. VITAL SIGNS:   Filed Vitals:   02/02/12 2054 02/03/12 0207 02/03/12 0255  BP: 103/56 112/50 106/62  Pulse: 58 69 72  Temp: 97.2 F (36.2 C)    TempSrc: Oral Oral   Resp: 16 16 20   SpO2: 96% 96% 98%   CONSTITUTIONAL: Awake, oriented, appears non-toxic HENT: Atraumatic, normocephalic, oral mucosa pink and moist, airway patent. Nares patent without drainage. External ears normal. EYES: Conjunctiva clear, EOMI, PERRLA NECK: Trachea midline, non-tender, supple CARDIOVASCULAR: Normal heart rate, Normal rhythm, No murmurs, rubs, gallops PULMONARY/CHEST: Clear to auscultation, no rhonchi, wheezes, or rales. Symmetrical breath sounds. Left chest wall tenderness in the 4-6 ribs lateral to the mid-clavicular line. ABDOMINAL: Non-distended, soft, non-tender - no rebound or guarding.  BS normal. NEUROLOGIC: Non-focal, moving all four extremities, no gross sensory or motor deficits. EXTREMITIES: No clubbing, cyanosis, or edema. Very TTP at proximal humerus.  Left hand intact to sensation to light tough in median, ulnar and radial nerve distributions.  Strength normal in hand.  Flexion and extension of LUE limited by severe ain of proximal humerus.  Legs uninjured.  Small skin tear to left hand.  No lacerations. SKIN: Warm, Dry, No erythema, No rash  ED Course  Procedures (including critical care time)  Labs Reviewed - No data to display Dg Chest 2 View  02/03/2012  *RADIOLOGY REPORT*  Clinical Data: Status post fall.  Left rib pain.  CHEST - 2 VIEW  Comparison: PA and lateral chest 05/31/2010.  Findings: Lungs are clear.  Heart size is normal.  Calcified mitral annulus noted.  No pneumothorax or pleural fluid.  Surgical neck fracture left humerus is identified.  IMPRESSION:  1.  No acute  cardiopulmonary disease. 2.  Surgical neck fracture left humerus.   Original Report Authenticated By: [name] Cunningham. [name] Cunningham, M.D.    Dg Ribs Unilateral Left  02/03/2012  *RADIOLOGY REPORT*  Clinical Data: Fall, pain.  LEFT RIBS - 2 VIEW  Comparison: None.  Findings: Surgical neck fracture of the left humerus is identified. There is a  minimally-displaced fracture of the anterior arc of the left fifth rib.  The study is otherwise unremarkable.  IMPRESSION:  1.  Left fifth rib fracture. 2.  Surgical neck fracture left humerus.   Original Report Authenticated By: [name] Cunningham. [name] Cunningham, M.D.    Dg Elbow Complete Left  02/03/2012  *RADIOLOGY REPORT*  Clinical Data: Fall, pain.  LEFT ELBOW - COMPLETE 3+ VIEW  Comparison: None.  Findings: Imaged bones, joints and soft tissues appear normal.  IMPRESSION: Negative study.   Original Report Authenticated By: [name] Cunningham. [name] Cunningham, M.D.    Dg Wrist Complete Left  02/03/2012  *RADIOLOGY REPORT*  Clinical Data: Fall, pain.  LEFT WRIST - COMPLETE 3+ VIEW  Comparison: None.  Findings: There is no acute bony or joint abnormality. Chondrocalcinosis of the triangular fibrocartilage is noted. Atherosclerosis is seen.  IMPRESSION: No acute abnormality.   Original Report Authenticated By: [name] Cunningham. Maricela Curet, M.D.    Dg Hand 2 View Left  02/03/2012  *RADIOLOGY REPORT*  Clinical Data: Fall.  Arm pain.  Abrasions to hand.  LEFT HAND - 2 VIEW  Comparison: None.  Findings: Soft tissue swelling is present over the dorsum of the hand at the level of the MCP joints.  No acute fracture or dislocation is evident.  Mild osteopenia is noted.  IMPRESSION:  1.  Soft tissue swelling over the dorsum of the hand without acute osseous abnormality.   Original Report Authenticated By: Jamesetta Orleans. MATTERN, M.D.    Dg Shoulder Left  02/03/2012  *RADIOLOGY REPORT*  Clinical Data: Fall, pain.  LEFT SHOULDER - 2+ VIEW  Comparison: None.  Findings: The patient has a mildly impacted surgical neck  fracture of the left humerus which appears to involve both the greater and lesser tuberosities.  The humeral head is located and the acromioclavicular joint is intact.  Acromioclavicular degenerative change is identified.  Imaged left lung and ribs are clear.  IMPRESSION: Mildly impacted surgical neck fracture appears to involve both the greater and lesser tuberosities without displacement.   Original Report Authenticated By: [name] Cunningham. [name] Cunningham, M.D.      1. Fracture of humerus, proximal, left, closed   2. Rib fracture   3. Fall       MDM  [name] Cunningham is a 78 y.o. female presents with fractured proximal humerus on the right and rib fracture.  Incentive spirometer given.  Pain controlled in ED with IV medications.    Do not suspect occult head injury or alternative cause of fall i.e.syncope, arrythmia etc.  DC with orthopedics f/u and f/u in 2 days with PCP to assess pain and breathing.  I explained the diagnosis and have given explicit precautions to return to the ER including any other new or worsening symptoms. The patient understands and accepts the medical plan as it's been dictated and I have answered their questions. Discharge instructions concerning home care and prescriptions have been given.  The patient is STABLE and is discharged to home in good condition.         Jones Skene, MD 02/04/12 1436

## 2012-02-14 ENCOUNTER — Other Ambulatory Visit: Payer: Self-pay | Admitting: Cardiovascular Disease

## 2012-02-16 ENCOUNTER — Ambulatory Visit: Payer: Medicare Other | Admitting: Cardiovascular Disease

## 2012-06-20 ENCOUNTER — Observation Stay: Payer: Self-pay | Admitting: Specialist

## 2012-06-20 LAB — HEPATIC FUNCTION PANEL A (ARMC)
Albumin: 3.3 g/dL — ABNORMAL LOW (ref 3.4–5.0)
Bilirubin,Total: 0.3 mg/dL (ref 0.2–1.0)
SGOT(AST): 16 U/L (ref 15–37)
SGPT (ALT): 15 U/L (ref 12–78)
Total Protein: 7.2 g/dL (ref 6.4–8.2)

## 2012-06-20 LAB — CBC
HCT: 36.2 % (ref 35.0–47.0)
MCH: 29.2 pg (ref 26.0–34.0)
MCV: 86 fL (ref 80–100)
RBC: 4.19 10*6/uL (ref 3.80–5.20)
WBC: 14 10*3/uL — ABNORMAL HIGH (ref 3.6–11.0)

## 2012-06-20 LAB — BASIC METABOLIC PANEL
Anion Gap: 8 (ref 7–16)
BUN: 19 mg/dL — ABNORMAL HIGH (ref 7–18)
Calcium, Total: 8.7 mg/dL (ref 8.5–10.1)
Co2: 22 mmol/L (ref 21–32)
EGFR (African American): >60
EGFR (Non-African Amer.): 54 — ABNORMAL LOW
Potassium: 3.8 mmol/L (ref 3.5–5.1)

## 2012-06-20 LAB — PROTIME-INR: INR: 3.7

## 2012-06-21 LAB — URINALYSIS, COMPLETE
Bilirubin,UR: NEGATIVE
Glucose,UR: NEGATIVE mg/dL (ref 0–75)
Ketone: NEGATIVE
Nitrite: NEGATIVE
Protein: NEGATIVE
RBC,UR: 4 /HPF (ref 0–5)
Squamous Epithelial: 19
WBC UR: 1 /HPF (ref 0–5)

## 2012-06-24 LAB — STOOL CULTURE

## 2012-07-01 ENCOUNTER — Encounter: Payer: Self-pay | Admitting: Pharmacist

## 2013-03-14 ENCOUNTER — Emergency Department: Payer: Self-pay | Admitting: Emergency Medicine

## 2013-03-14 ENCOUNTER — Ambulatory Visit: Payer: Self-pay | Admitting: Family Medicine

## 2013-03-14 LAB — URINALYSIS, COMPLETE
Bacteria: NONE SEEN
Bilirubin,UR: NEGATIVE
Blood: NEGATIVE
Glucose,UR: NEGATIVE mg/dL (ref 0–75)
Ketone: NEGATIVE
Leukocyte Esterase: NEGATIVE
Protein: NEGATIVE
Specific Gravity: 1.008 (ref 1.003–1.030)
Squamous Epithelial: 2

## 2013-10-19 NOTE — Telephone Encounter (Signed)
Close encounter 

## 2014-04-11 DIAGNOSIS — Z5181 Encounter for therapeutic drug level monitoring: Secondary | ICD-10-CM | POA: Diagnosis not present

## 2014-04-11 DIAGNOSIS — Z79899 Other long term (current) drug therapy: Secondary | ICD-10-CM | POA: Diagnosis not present

## 2014-04-11 DIAGNOSIS — Z7901 Long term (current) use of anticoagulants: Secondary | ICD-10-CM | POA: Diagnosis not present

## 2014-04-11 DIAGNOSIS — I4891 Unspecified atrial fibrillation: Secondary | ICD-10-CM | POA: Diagnosis not present

## 2014-05-01 DIAGNOSIS — I4891 Unspecified atrial fibrillation: Secondary | ICD-10-CM | POA: Diagnosis not present

## 2014-05-01 DIAGNOSIS — Z7901 Long term (current) use of anticoagulants: Secondary | ICD-10-CM | POA: Diagnosis not present

## 2014-05-17 DIAGNOSIS — I482 Chronic atrial fibrillation: Secondary | ICD-10-CM | POA: Diagnosis not present

## 2014-05-30 DIAGNOSIS — Z7901 Long term (current) use of anticoagulants: Secondary | ICD-10-CM | POA: Diagnosis not present

## 2014-05-30 DIAGNOSIS — I4891 Unspecified atrial fibrillation: Secondary | ICD-10-CM | POA: Diagnosis not present

## 2014-06-04 ENCOUNTER — Ambulatory Visit: Payer: Self-pay | Admitting: *Deleted

## 2014-06-04 ENCOUNTER — Telehealth: Payer: Self-pay | Admitting: *Deleted

## 2014-06-04 DIAGNOSIS — I4891 Unspecified atrial fibrillation: Secondary | ICD-10-CM

## 2014-06-04 DIAGNOSIS — G459 Transient cerebral ischemic attack, unspecified: Secondary | ICD-10-CM

## 2014-06-04 NOTE — Telephone Encounter (Signed)
Called patient to inquire if she was taking Coumadin or any anticoagulation, unable to leave a message because voice mail is not set up. Called her daughter Mrs. Jones and she told me the patient was being monitored and [facility] [place] for her coumadin.

## 2014-06-20 DIAGNOSIS — Z7901 Long term (current) use of anticoagulants: Secondary | ICD-10-CM | POA: Diagnosis not present

## 2014-06-20 DIAGNOSIS — I4891 Unspecified atrial fibrillation: Secondary | ICD-10-CM | POA: Diagnosis not present

## 2014-07-11 DIAGNOSIS — M8580 Other specified disorders of bone density and structure, unspecified site: Secondary | ICD-10-CM | POA: Diagnosis not present

## 2014-07-11 DIAGNOSIS — M170 Bilateral primary osteoarthritis of knee: Secondary | ICD-10-CM | POA: Diagnosis not present

## 2014-07-11 DIAGNOSIS — I4891 Unspecified atrial fibrillation: Secondary | ICD-10-CM | POA: Diagnosis not present

## 2014-07-11 DIAGNOSIS — M1712 Unilateral primary osteoarthritis, left knee: Secondary | ICD-10-CM | POA: Diagnosis not present

## 2014-07-11 DIAGNOSIS — Z7901 Long term (current) use of anticoagulants: Secondary | ICD-10-CM | POA: Diagnosis not present

## 2014-07-27 NOTE — H&P (Signed)
PATIENT NAME:  [name] Cunningham, [name] Cunningham MR#:  [id number] DATE OF BIRTH:  07/14/1928  DATE OF ADMISSION:  06/20/2012  PRIMARY CARE PHYSICIAN: Loyal Jacobson, MD at [facility]   CHIEF COMPLAINT: Nausea, vomiting and diarrhea for 3 to 4 days.   HISTORY OF PRESENT ILLNESS: The patient is a very pleasant 81 year old Caucasian female with history of atrial fibrillation on Coumadin, hypertension, comes to the Emergency Room accompanied by her daughter with the above-mentioned chief complaint. The patient started having some vomiting about 4 days ago and, thereafter, followed with diarrhea which is nonbloody several times a day where the patient is now feeling very dehydrated, weak, tired, was not able to get around today and had to be brought to the Emergency Room for further evaluation. She has been hemodynamically stable, however, does look clinically dehydrated and is being admitted for further evaluation and management.   PAST MEDICAL HISTORY: 1. History of chronic a-fib, on Coumadin.  2. DJD.  3. Hypertension.  4. GERD.  ALLERGIES: No known drug allergies.   PAST SURGICAL HISTORY: 1. Tonsilloadenoidectomy.  2. Hysterectomy.  3. Gallbladder removal.   FAMILY HISTORY: Mother died of stomach cancer at age 76, father from MI.   SOCIAL HISTORY: She is widowed, lives with her daughter in [place]. She used to smoke a pack a day.    REVIEW OF SYSTEMS:  CONSTITUTIONAL: Positive for fatigue, weakness. No fever.  EYES: No blurred or double vision. No cataracts.  ENT: No tinnitus, ear pain, hearing loss.  RESPIRATORY: No cough, wheeze, hemoptysis or shortness of breath.  CARDIOVASCULAR: No chest pain. Positive for hypertension and a-fib.  GASTROINTESTINAL: Positive for nausea, vomiting, and diarrhea. No melena or constipation.  GENITOURINARY: No dysuria or hematuria.  ENDOCRINOLOGY: No thyroid problems, polyuria, nocturia.  HEMATOLOGY: No anemia or easy bruising.  SKIN: No acne or rash.   MUSCULOSKELETAL: Positive for arthritis and back pain.  NEUROLOGICAL: No CVA, TIA or dementia.  PSYCHIATRIC: No anxiety or depression.  All other systems were reviewed and negative.   PHYSICAL EXAMINATION: GENERAL: The patient is awake, alert, oriented x 3, not in acute distress.  VITAL SIGNS: She is afebrile, pulse is 91, blood pressure is 112/89, sats are 98% on 2 liters.  HEENT: Atraumatic, normocephalic. PERRLA.  EOM intact. Oral mucosa is dry.  NECK: Supple. No JVD. No carotid bruit.  LUNGS: Clear to auscultation bilaterally. No rales, rhonchi, respiratory distress or labored breathing.  CARDIOVASCULAR: Tachycardia present. No murmur heard. PMI not lateralized. Chest nontender.  EXTREMITIES: Good pedal pulses, good femoral pulses. No lower extremity edema.  ABDOMEN: Obese, soft, nontender. No organomegaly. Positive bowel sounds.  NEUROLOGIC: Grossly intact cranial nerves II through XII. No motor or sensory deficit.  SKIN: Warm and dry.   LABORATORY, DIAGNOSTIC AND RADIOLOGICAL DATA:  Chest x-ray: No pneumonia or CHF.   Glucose is 112, BUN is 19, creatinine is 0.9, sodium is 133, potassium is 3.8, chloride is 103, bicarb is 22, calcium is 8.7. Hemogram is within normal limits except white count of 14.08. PT-INR is 35.3 and 3.7. LFTs are within normal limits. Troponin is less than 0.02.   ASSESSMENT: The patient is an 81 year old with history of hypertension, chronic a-fib and a history of peripheral neuropathy, comes in with:   1. Acute gastroenteritis: We will give the patient a clear liquid diet, start IV fluids and admit on a regular floor. The patient clinically appears dehydrated. Her sodium is mildly low, and her BUN is mildly elevated. We will check labs  in the morning. We will start a clear liquid diet, advance as tolerated. Follow-up stool studies, which are C. diff and stool cultures. Tylenol p.r.n., Lomotil as needed.  2. History of hypertension:  I will continue  her on  home medication, which is diltiazem.  I will hold off on losartan at present. I do not want the blood pressure to drop, given gastroenteritis. We can resume prior to discharge.  3. History of chronic atrial fibrillation, on Coumadin: We will continue diltiazem and warfarin. The patient's INR is therapeutic.  4. Degenerative joint disease: Tylenol p.r.n.  5. Deep vein thrombosis prophylaxis: The patient has therapeutic INR on Coumadin.  6. Generalized weakness with deconditioning: We will have Physical Therapy see the patient in consultation. The patient normally uses a walker at home.   The above was discussed with the patient. Further work-up according to the patient's clinical course. Hospital admission plan was discussed with her daughter as well who was present in the Emergency Room.   TIME SPENT: 50 minutes.  ____________________________ Wylie HailSona A. Allena KatzPatel, MD sap:cb D: 06/20/2012 14:49:26 ET T: 06/20/2012 15:42:04 ET JOB#: [id number]  cc: Loralai Eisman A. Allena KatzPatel, MD, <Dictator> [facility], Dr. Loyal JacobsonLindsey Wilson Willow OraSONA A Alfrieda Tarry MD ELECTRONICALLY SIGNED 07/02/2012 13:04

## 2014-07-27 NOTE — Discharge Summary (Signed)
PATIENT NAME:  [name] Cunningham, Brittni MR#:  [id number] DATE OF BIRTH:  May 07, 1928  DATE OF ADMISSION:  06/20/2012 DATE OF DISCHARGE:  06/21/2012   For a detailed note, please take a look at the history and physical done on admission by Dr. Enedina FinnerSona Patel.  DISCHARGE DIAGNOSES: Acute gastroenteritis, nausea, vomiting and diarrhea, likely secondary to acute gastroenteritis, much improved; hypertension; gastroesophageal reflux disease; history of chronic atrial fibrillation.   DIET: The patient is being discharged on a low sodium, low fat diet.   ACTIVITY: As tolerated.   FOLLOWUP: With [facility]. Terrall LaityLindsay Wilson [facility] in the next 1 to 2 weeks.   DISCHARGE MEDICATIONS: Gabapentin 100 mg t.i.d., cilostazol 50 mg b.i.d., Drisdol 50,000 international units monthly. Warfarin 2 mg on Monday, Wednesday, Thursday, Saturday and Sunday; the patient is to hold her warfarin for the next 2 days. Cardizem CD 360 mg daily, Remeron 15 mg at bedtime, omeprazole 40 mg daily, lisinopril 20 mg daily, magnesium oxide 250 mg daily, Lasix 40 mg daily, fluticasone 2 sprays to each nostril daily, aspirin 81 mg daily. Warfarin 1.5 mg on Tuesdays and Fridays; she is to hold her warfarin for the next 2 days.   PERTINENT STUDIES DONE DURING THE HOSPITAL COURSE: Chest x-ray done on admission showing no evidence of acute pneumonia, CHF, acute cardiopulmonary disease. Blood cultures negative. Stool cultures to be negative for any pathogen.   BRIEF HOSPITAL COURSE: This is an 81 year old female with medical problems as mentioned above, presented to the hospital with nausea, vomiting, diarrhea and also significant weakness.  1.  Acute gastroenteritis: This was likely the cause of the patient's nausea, vomiting, diarrhea. The patient was treated supportively with IV fluids, antiemetics and antidiarrheals. She had stool cultures sent out which were negative. Overnight, the patient's clinical symptoms have significantly improved after  some IV fluid hydration. She has tolerated a regular diet well without any exacerbation of her symptoms. Therefore, this is likely viral and self-limiting in nature and therefore, she is being discharged home.  2.  History of chronic atrial fibrillation: The patient remains rate controlled on her Cardizem. She will resume that. Her INR was noted to be supratherapeutic; therefore, she is being advised to hold her Coumadin for the next 2 days.  3.  Hypertension: The patient was maintained on her Cardizem and she has remained hemodynamically stable. She will resume that upon discharge, along with her Lasix.  4.  GERD: The patient was maintained on her omeprazole. She will resume that upon discharge too.  5.  Neuropathy: The patient was maintained on Neurontin. She will also resume that upon discharge.   CODE STATUS: The patient is a full code.   TIME SPENT: 35 minutes.    ____________________________ Rolly PancakeVivek J. Cherlynn KaiserSainani, MD vjs:jm D: 06/21/2012 15:27:59 ET T: 06/21/2012 16:59:22 ET JOB#: [id number]  cc: Rolly PancakeVivek J. Cherlynn KaiserSainani, MD, <Dictator> Dr. Terrall LaityLindsay Wilson Houston SirenVIVEK J Annalisa Colonna MD ELECTRONICALLY SIGNED 07/03/2012 12:29

## 2014-08-01 DIAGNOSIS — I4891 Unspecified atrial fibrillation: Secondary | ICD-10-CM | POA: Diagnosis not present

## 2014-08-07 DIAGNOSIS — M11862 Other specified crystal arthropathies, left knee: Secondary | ICD-10-CM | POA: Diagnosis not present

## 2014-08-07 DIAGNOSIS — Z7901 Long term (current) use of anticoagulants: Secondary | ICD-10-CM | POA: Diagnosis not present

## 2014-08-07 DIAGNOSIS — Z87891 Personal history of nicotine dependence: Secondary | ICD-10-CM | POA: Diagnosis not present

## 2014-08-07 DIAGNOSIS — M25562 Pain in left knee: Secondary | ICD-10-CM | POA: Diagnosis not present

## 2014-08-07 DIAGNOSIS — I5030 Unspecified diastolic (congestive) heart failure: Secondary | ICD-10-CM | POA: Diagnosis not present

## 2014-08-07 DIAGNOSIS — I4891 Unspecified atrial fibrillation: Secondary | ICD-10-CM | POA: Diagnosis not present

## 2014-08-16 DIAGNOSIS — I5032 Chronic diastolic (congestive) heart failure: Secondary | ICD-10-CM | POA: Diagnosis not present

## 2014-08-16 DIAGNOSIS — I4891 Unspecified atrial fibrillation: Secondary | ICD-10-CM | POA: Diagnosis not present

## 2014-08-16 DIAGNOSIS — I10 Essential (primary) hypertension: Secondary | ICD-10-CM | POA: Diagnosis not present

## 2015-01-10 ENCOUNTER — Inpatient Hospital Stay
Admission: EM | Admit: 2015-01-10 | Discharge: 2015-01-15 | DRG: 871 | Disposition: A | Discharge status: Home / Self Care | Payer: Medicare Other | Attending: Internal Medicine | Admitting: Internal Medicine

## 2015-01-10 ENCOUNTER — Emergency Department: Payer: Medicare Other

## 2015-01-10 ENCOUNTER — Encounter: Payer: Self-pay | Admitting: Emergency Medicine

## 2015-01-10 DIAGNOSIS — J449 Chronic obstructive pulmonary disease, unspecified: Secondary | ICD-10-CM | POA: Diagnosis present

## 2015-01-10 DIAGNOSIS — N289 Disorder of kidney and ureter, unspecified: Secondary | ICD-10-CM | POA: Diagnosis present

## 2015-01-10 DIAGNOSIS — F0390 Unspecified dementia without behavioral disturbance: Secondary | ICD-10-CM | POA: Diagnosis present

## 2015-01-10 DIAGNOSIS — Z90710 Acquired absence of both cervix and uterus: Secondary | ICD-10-CM | POA: Diagnosis not present

## 2015-01-10 DIAGNOSIS — E876 Hypokalemia: Secondary | ICD-10-CM

## 2015-01-10 DIAGNOSIS — Z888 Allergy status to other drugs, medicaments and biological substances status: Secondary | ICD-10-CM

## 2015-01-10 DIAGNOSIS — Z9049 Acquired absence of other specified parts of digestive tract: Secondary | ICD-10-CM | POA: Diagnosis not present

## 2015-01-10 DIAGNOSIS — I739 Peripheral vascular disease, unspecified: Secondary | ICD-10-CM | POA: Diagnosis present

## 2015-01-10 DIAGNOSIS — R739 Hyperglycemia, unspecified: Secondary | ICD-10-CM | POA: Diagnosis present

## 2015-01-10 DIAGNOSIS — I639 Cerebral infarction, unspecified: Secondary | ICD-10-CM | POA: Diagnosis present

## 2015-01-10 DIAGNOSIS — Z9849 Cataract extraction status, unspecified eye: Secondary | ICD-10-CM | POA: Diagnosis not present

## 2015-01-10 DIAGNOSIS — I2510 Atherosclerotic heart disease of native coronary artery without angina pectoris: Secondary | ICD-10-CM | POA: Diagnosis present

## 2015-01-10 DIAGNOSIS — R0902 Hypoxemia: Secondary | ICD-10-CM | POA: Diagnosis present

## 2015-01-10 DIAGNOSIS — I340 Nonrheumatic mitral (valve) insufficiency: Secondary | ICD-10-CM | POA: Diagnosis not present

## 2015-01-10 DIAGNOSIS — Z7982 Long term (current) use of aspirin: Secondary | ICD-10-CM | POA: Diagnosis not present

## 2015-01-10 DIAGNOSIS — Z833 Family history of diabetes mellitus: Secondary | ICD-10-CM

## 2015-01-10 DIAGNOSIS — I5032 Chronic diastolic (congestive) heart failure: Secondary | ICD-10-CM | POA: Diagnosis present

## 2015-01-10 DIAGNOSIS — A419 Sepsis, unspecified organism: Secondary | ICD-10-CM | POA: Diagnosis present

## 2015-01-10 DIAGNOSIS — Z800 Family history of malignant neoplasm of digestive organs: Secondary | ICD-10-CM

## 2015-01-10 DIAGNOSIS — Z825 Family history of asthma and other chronic lower respiratory diseases: Secondary | ICD-10-CM | POA: Diagnosis not present

## 2015-01-10 DIAGNOSIS — J189 Pneumonia, unspecified organism: Secondary | ICD-10-CM | POA: Diagnosis present

## 2015-01-10 DIAGNOSIS — I480 Paroxysmal atrial fibrillation: Secondary | ICD-10-CM | POA: Diagnosis present

## 2015-01-10 DIAGNOSIS — Z9889 Other specified postprocedural states: Secondary | ICD-10-CM | POA: Diagnosis not present

## 2015-01-10 DIAGNOSIS — Z8249 Family history of ischemic heart disease and other diseases of the circulatory system: Secondary | ICD-10-CM

## 2015-01-10 DIAGNOSIS — Z7951 Long term (current) use of inhaled steroids: Secondary | ICD-10-CM | POA: Diagnosis not present

## 2015-01-10 DIAGNOSIS — Z7901 Long term (current) use of anticoagulants: Secondary | ICD-10-CM

## 2015-01-10 DIAGNOSIS — R001 Bradycardia, unspecified: Secondary | ICD-10-CM | POA: Diagnosis present

## 2015-01-10 DIAGNOSIS — R579 Shock, unspecified: Secondary | ICD-10-CM | POA: Diagnosis present

## 2015-01-10 DIAGNOSIS — E860 Dehydration: Secondary | ICD-10-CM | POA: Diagnosis present

## 2015-01-10 DIAGNOSIS — F17210 Nicotine dependence, cigarettes, uncomplicated: Secondary | ICD-10-CM | POA: Diagnosis present

## 2015-01-10 DIAGNOSIS — Z79899 Other long term (current) drug therapy: Secondary | ICD-10-CM

## 2015-01-10 DIAGNOSIS — K219 Gastro-esophageal reflux disease without esophagitis: Secondary | ICD-10-CM | POA: Diagnosis present

## 2015-01-10 DIAGNOSIS — Z9851 Tubal ligation status: Secondary | ICD-10-CM | POA: Diagnosis not present

## 2015-01-10 DIAGNOSIS — I110 Hypertensive heart disease with heart failure: Secondary | ICD-10-CM | POA: Diagnosis present

## 2015-01-10 DIAGNOSIS — K529 Noninfective gastroenteritis and colitis, unspecified: Secondary | ICD-10-CM | POA: Diagnosis present

## 2015-01-10 DIAGNOSIS — G9341 Metabolic encephalopathy: Secondary | ICD-10-CM | POA: Diagnosis present

## 2015-01-10 DIAGNOSIS — J9811 Atelectasis: Secondary | ICD-10-CM

## 2015-01-10 DIAGNOSIS — G4730 Sleep apnea, unspecified: Secondary | ICD-10-CM | POA: Diagnosis present

## 2015-01-10 DIAGNOSIS — R112 Nausea with vomiting, unspecified: Secondary | ICD-10-CM

## 2015-01-10 HISTORY — DX: Heart failure, unspecified: I50.9

## 2015-01-10 LAB — CBC
HCT: 37.9 % (ref 35.0–47.0)
HEMOGLOBIN: 12.8 g/dL (ref 12.0–16.0)
MCH: 28.6 pg (ref 26.0–34.0)
MCHC: 33.8 g/dL (ref 32.0–36.0)
MCV: 84.8 fL (ref 80.0–100.0)
Platelets: 263 10*3/uL (ref 150–440)
RBC: 4.47 MIL/uL (ref 3.80–5.20)
RDW: 14 % (ref 11.5–14.5)
WBC: 21.4 10*3/uL — AB (ref 3.6–11.0)

## 2015-01-10 LAB — URINALYSIS COMPLETE WITH MICROSCOPIC (ARMC ONLY)
BACTERIA UA: NONE SEEN
Bilirubin Urine: NEGATIVE
GLUCOSE, UA: NEGATIVE mg/dL
HGB URINE DIPSTICK: NEGATIVE
Ketones, ur: NEGATIVE mg/dL
Leukocytes, UA: NEGATIVE
NITRITE: NEGATIVE
PROTEIN: NEGATIVE mg/dL
SPECIFIC GRAVITY, URINE: 1.013 (ref 1.005–1.030)
pH: 5 (ref 5.0–8.0)

## 2015-01-10 LAB — COMPREHENSIVE METABOLIC PANEL
ALT: 36 U/L (ref 14–54)
AST: 49 U/L — AB (ref 15–41)
Albumin: 3.6 g/dL (ref 3.5–5.0)
Alkaline Phosphatase: 52 U/L (ref 38–126)
Anion gap: 12 (ref 5–15)
BUN: 17 mg/dL (ref 6–20)
CHLORIDE: 93 mmol/L — AB (ref 101–111)
CO2: 29 mmol/L (ref 22–32)
CREATININE: 1.01 mg/dL — AB (ref 0.44–1.00)
Calcium: 8.2 mg/dL — ABNORMAL LOW (ref 8.9–10.3)
GFR, EST AFRICAN AMERICAN: 58 mL/min — AB (ref >60)
GFR, EST NON AFRICAN AMERICAN: 50 mL/min — AB (ref >60)
Glucose, Bld: 148 mg/dL — ABNORMAL HIGH (ref 65–99)
POTASSIUM: 2.9 mmol/L — AB (ref 3.5–5.1)
SODIUM: 134 mmol/L — AB (ref 135–145)
Total Bilirubin: 1 mg/dL (ref 0.3–1.2)
Total Protein: 7.3 g/dL (ref 6.5–8.1)

## 2015-01-10 LAB — BLOOD GAS, VENOUS
ACID-BASE EXCESS: 6 mmol/L — AB (ref 0.0–3.0)
Bicarbonate: 32.2 mEq/L — ABNORMAL HIGH (ref 21.0–28.0)
PCO2 VEN: 52 mmHg (ref 44.0–60.0)
PH VEN: 7.4 (ref 7.320–7.430)
Patient temperature: 37

## 2015-01-10 LAB — TROPONIN I

## 2015-01-10 MED ORDER — IOHEXOL 240 MG/ML SOLN
25.0000 mL | Freq: Once | INTRAMUSCULAR | Status: AC | PRN
  Administered 2015-01-10: 25 mL via ORAL

## 2015-01-10 MED ORDER — SODIUM CHLORIDE 0.9 % IV BOLUS (SEPSIS)
1000.0000 mL | Freq: Once | INTRAVENOUS | Status: AC
Start: 2015-01-10 — End: 2015-01-10
  Administered 2015-01-10: 1000 mL via INTRAVENOUS

## 2015-01-10 MED ORDER — IOHEXOL 300 MG/ML  SOLN
75.0000 mL | Freq: Once | INTRAMUSCULAR | Status: AC | PRN
  Administered 2015-01-10: 75 mL via INTRAVENOUS

## 2015-01-10 MED ORDER — METRONIDAZOLE IN NACL 5-0.79 MG/ML-% IV SOLN
500.0000 mg | Freq: Once | INTRAVENOUS | Status: AC
  Administered 2015-01-10: 500 mg via INTRAVENOUS
  Filled 2015-01-10: qty 100

## 2015-01-10 MED ORDER — CIPROFLOXACIN IN D5W 400 MG/200ML IV SOLN
400.0000 mg | Freq: Once | INTRAVENOUS | Status: AC
  Administered 2015-01-11: 400 mg via INTRAVENOUS
  Filled 2015-01-10: qty 200

## 2015-01-10 MED ORDER — POTASSIUM CHLORIDE 20 MEQ PO PACK
PACK | ORAL | Status: AC
  Administered 2015-01-10: 40 meq
  Filled 2015-01-10: qty 2

## 2015-01-10 MED ORDER — POTASSIUM CHLORIDE 20 MEQ/15ML (10%) PO SOLN: 40.0000 meq | Freq: Once | ORAL | Status: AC

## 2015-01-10 MED ORDER — LEVOFLOXACIN IN D5W 750 MG/150ML IV SOLN
750.0000 mg | Freq: Once | INTRAVENOUS | Status: AC
  Administered 2015-01-10: 750 mg via INTRAVENOUS
  Filled 2015-01-10: qty 150

## 2015-01-10 NOTE — ED Provider Notes (Signed)
[facility] Emergency Department Provider Note  ____________________________________________  Time seen: Approximately 7:54 PM  I have reviewed the triage vital signs and the nursing notes.   HISTORY  Chief Complaint Weakness  History and physical are limited due to patient dementia.   HPI [name] Cunningham is a 81 y.o. female with a history of dementia, COPD, HTN, DM, CAD, A. fib on Coumadin, presenting from home with generalized weakness, nausea and vomiting 2 days. The patient is unable to give a history and the patient's family has not yet arrived. Per EMS, patient has had 2 days of nausea and vomiting with generalized weakness. Oxygen saturations were 88% on arrival and bumped to the mid 90s on 2 L nasal cannula. The patient denies any pain at this time.   Past Medical History  Diagnosis Date  . HYPERLIPIDEMIA 01/06/2007  . HYPERTENSION 01/06/2007  . COPD 01/06/2007  . ANXIETY 11/21/2008  . SLEEP APNEA 11/21/2008  . DIVERTICULITIS OF COLON 07/23/2009  . Fistula   . GERD 01/06/2007  . PAD (peripheral artery disease) (HCC)   . CAD (coronary artery disease)     Mild by cath in remote past  . Atrial fibrillation (HCC)   . CHF (congestive heart failure) [facility])     Patient Active Problem List   Diagnosis Date Noted  . Diastolic CHF (HCC) 09/15/2011  . Atrial fibrillation (HCC) 09/15/2011  . PAD (peripheral artery disease) (HCC) 07/15/2011  . Edema of both legs 07/15/2011  . Palpitations 07/15/2011  . Back pain 01/28/2011  . TIA (transient ischemic attack) 06/11/2010  . TOBACCO USE 07/23/2009  . DIVERTICULITIS OF COLON 07/23/2009  . ANXIETY 11/21/2008  . SLEEP APNEA 11/21/2008  . CAD 01/04/2008  . PERIPHERAL VASCULAR DISEASE 01/04/2008  . HIATAL HERNIA 01/04/2008  . PULMONARY EMBOLISM, HX OF 01/04/2008  . DIABETES MELLITUS, TYPE II 01/17/2007  . Polymyalgia rheumatica (HCC) 01/12/2007  . HYPERLIPIDEMIA 01/06/2007  . HYPERTENSION 01/06/2007  .  COPD 01/06/2007  . GERD 01/06/2007    Past Surgical History  Procedure Laterality Date  . Temporal artery biopsy / ligation    . Cataract extraction    . Cholecystectomy    . Abdominal hysterectomy    . Appendectomy    . Partial colectomy      diverticulitis  . Colovaginal fistula repair      after partial colectomy  . Tubal ligation    . Gallbladder surgery    . Tonsillectomy  81 YEARS OLD    Current Outpatient Rx  Name  Route  Sig  Dispense  Refill  . acetaminophen (TYLENOL) 500 MG tablet   Oral   Take 1,000 mg by mouth 3 (three) times daily.          Marland Kitchen alendronate (FOSAMAX) 70 MG tablet   Oral   Take 70 mg by mouth once a week. Take with a full glass of water on an empty stomach.         . diltiazem (CARDIZEM CD) 360 MG 24 hr capsule   Oral   Take 1 capsule (360 mg total) by mouth daily.   30 capsule   6   . fluticasone (FLONASE) 50 MCG/ACT nasal spray      USE 2 SPRAYS IN NOSE DAILY AS DIRECTED   16 g   5   . furosemide (LASIX) 40 MG tablet   Oral   Take 1 tablet (40 mg total) by mouth daily.   30 tablet   6   .  gabapentin (NEURONTIN) 100 MG capsule   Oral   Take 100 mg by mouth 3 (three) times daily.         Marland Kitchen lisinopril (PRINIVIL,ZESTRIL) 10 MG tablet   Oral   Take 2 tablets (20 mg total) by mouth daily.   30 tablet   11   . mirtazapine (REMERON) 15 MG tablet   Oral   Take 15 mg by mouth at bedtime.         Marland Kitchen nystatin cream (MYCOSTATIN)   Topical   Apply 1 application topically 2 (two) times daily.         Marland Kitchen omeprazole (PRILOSEC) 40 MG capsule   Oral   Take 40 mg by mouth daily.         . ondansetron (ZOFRAN) 4 MG tablet   Oral   Take 1 tablet (4 mg total) by mouth every 6 (six) hours. As needed for nausea or vomiting   12 tablet   0   . polyethylene glycol (MIRALAX / GLYCOLAX) packet   Oral   Take 17 g by mouth daily as needed.         . traMADol (ULTRAM) 50 MG tablet   Oral   Take 50 mg by mouth every 6 (six) hours  as needed.         . Vitamin D, Ergocalciferol, (DRISDOL) 50000 UNITS CAPS   Oral   Take 50,000 Units by mouth every 30 (thirty) days.         Marland Kitchen warfarin (COUMADIN) 1 MG tablet   Oral   Take 1.5-2 mg by mouth daily. Take 1mg  by mouth on three days per week and take 1.5mg  by mouth on mondays, wednesdays, fridays, saturdays         . aspirin 81 MG tablet   Oral   Take 1 tablet (81 mg total) by mouth daily.   30 tablet   0   . cilostazol (PLETAL) 50 MG tablet      TAKE 1 TABLET (50 MG TOTAL) BY MOUTH 2 (TWO) TIMES DAILY.   60 tablet   11   . Magnesium 250 MG TABS   Oral   Take 1 tablet by mouth 2 (two) times daily.         Marland Kitchen oxyCODONE-acetaminophen (PERCOCET/ROXICET) 5-325 MG per tablet   Oral   Take 1-2 tablets by mouth every 6 (six) hours as needed (Moderate to severe pain).   17 tablet   0   . VOLTAREN 1 % GEL      APPLY TWICE A DAY TO SHOULDER AS NEEDED FOR PAIN   100 g   2     Allergies Hydroxyzine hcl  Family History  Problem Relation Age of Onset  . Heart disease Father   . Asthma Father   . Diabetes Daughter   . Colon cancer Son   . Cancer Mother     Social History Social History  Substance Use Topics  . Smoking status: Current Every Day Smoker -- 1.50 packs/day    Types: Cigarettes  . Smokeless tobacco: None  . Alcohol Use: No    Review of Systems Unable to obtain due to patient dementia.  ____________________________________________   PHYSICAL EXAM:  VITAL SIGNS: ED Triage Vitals  Enc Vitals Group     BP 01/10/15 1947 137/64 mmHg     Pulse Rate 01/10/15 1947 82     Resp 01/10/15 1947 14     Temp 01/10/15 1947 98.9 F (37.2 C)  Temp Source 01/10/15 1947 Oral     SpO2 01/10/15 1947 88 %     Weight 01/10/15 1947 146 lb 1.6 oz (66.271 kg)     Height 01/10/15 1947  (1.651 m)     Head Cir --      Peak Flow --      Pain Score 01/10/15 1948 0     Pain Loc --      Pain Edu? --      Excl. in GC? --      Constitutional: Alert and oriented only to person and place.. Patient is lying comfortably in the stretcher and occasionally falls asleep during my exam but is arousable to voice. She is able to answer simple questions and follows simple commands.  Eyes: Conjunctivae are normal.  EOMI. Head: Atraumatic. Nose: No congestion/rhinnorhea. Mouth/Throat: Mucous membranes are moist.  Neck: No stridor.  Supple.   Cardiovascular: Normal rate, regular rhythm. No murmurs, rubs or gallops.  Respiratory: Normal respiratory effort.  No retractions. Lungs CTAB.  No wheezes, rales or ronchi. Gastrointestinal: Soft and nontender. No distention. No peritoneal signs. Obese. Musculoskeletal: No LE edema.  Neurologic:  Face and smile are symmetric. Extraocular movements are intact. Normal speech and language. No gross focal neurologic deficits are appreciated; moves all extremities well..  Skin:  Skin is warm, dry and intact. No rash noted. Psychiatric: Mood and affect are normal.   ____________________________________________   LABS (all labs ordered are listed, but only abnormal results are displayed)  Labs Reviewed  CBC - Abnormal; Notable for the following:    WBC 21.4 (*)    All other components within normal limits  URINALYSIS COMPLETEWITH MICROSCOPIC ([facility] ONLY) - Abnormal; Notable for the following:    Color, Urine YELLOW (*)    APPearance CLEAR (*)    Squamous Epithelial / LPF 0-5 (*)    All other components within normal limits  COMPREHENSIVE METABOLIC PANEL - Abnormal; Notable for the following:    Sodium 134 (*)    Potassium 2.9 (*)    Chloride 93 (*)    Glucose, Bld 148 (*)    Creatinine, Ser 1.01 (*)    Calcium 8.2 (*)    AST 49 (*)    GFR calc non Af Amer 50 (*)    GFR calc Af Amer 58 (*)    All other components within normal limits  BLOOD GAS, VENOUS - Abnormal; Notable for the following:    Bicarbonate 32.2 (*)    Acid-Base Excess 6.0 (*)    All other components within  normal limits  TROPONIN I   ____________________________________________  EKG  ED ECG REPORT I, Rockne Menghini, the attending physician, personally viewed and interpreted this ECG.   Date: 01/10/2015  EKG Time: 2022  Rate: 77  Rhythm: normal sinus rhythm with PACs  Axis: = Normal  Intervals:first-degree A-V block   ST&T Change: Right bundle branch block, no ST elevation.  ____________________________________________  RADIOLOGY  Dg Chest 2 View  01/10/2015   CLINICAL DATA:  Acute onset of nausea, vomiting and weakness. Initial encounter.  EXAM: CHEST  2 VIEW  COMPARISON:  Chest radiograph performed 06/20/2012  FINDINGS: The lungs are well-aerated. Minimal left basilar opacity likely reflects atelectasis. Pulmonary vascularity is at the upper limits of normal. There is no evidence of pleural effusion or pneumothorax.  The heart is normal in size; the mediastinal contour is within normal limits. No acute osseous abnormalities are seen.  IMPRESSION: Minimal left basilar opacity likely reflects  atelectasis. Lungs otherwise grossly clear.   Electronically Signed   By: Roanna Raider M.D.   On: 01/10/2015 21:26   Ct Head Wo Contrast  01/10/2015   CLINICAL DATA:  weakness, n/v x 2 days. no vomiting for past few hours. Pt hx dementia, afib, CHF, osteoporosis, and sleep apnea. VS stable en route except spo2 88% on RA, 94% on 2L.  EXAM: CT HEAD WITHOUT CONTRAST  TECHNIQUE: Contiguous axial images were obtained from the base of the skull through the vertex without intravenous contrast.  COMPARISON:  05/31/2010  FINDINGS: Atherosclerotic and physiologic intracranial calcifications. Diffuse parenchymal atrophy. Patchy areas of hypoattenuation in deep and periventricular white matter bilaterally. Negative for acute intracranial hemorrhage, mass lesion, acute infarction, midline shift, or mass-effect. Acute infarct may be inapparent on noncontrast CT. Ventricles and sulci symmetric. Bone windows  demonstrate no focal lesion.  IMPRESSION: 1. Negative for bleed or other acute intracranial process. 2. Atrophy and nonspecific white matter changes.   Electronically Signed   By: Corlis Leak M.D.   On: 01/10/2015 20:52   Ct Abdomen Pelvis W Contrast  01/10/2015   CLINICAL DATA:  Weakness, nausea and vomiting for 2 days.  EXAM: CT ABDOMEN AND PELVIS WITH CONTRAST  TECHNIQUE: Multidetector CT imaging of the abdomen and pelvis was performed using the standard protocol following bolus administration of intravenous contrast.  CONTRAST:  75mL OMNIPAQUE IOHEXOL 300 MG/ML  SOLN  COMPARISON:  None.  FINDINGS: Lower chest: Sub cm nodule in the posterior left lower lobe, probably not significantly changed from 03/30/2009. No acute findings in the lower chest.  Hepatobiliary: Prior cholecystectomy. Liver and bile ducts are unremarkable.  Pancreas: Normal  Spleen: Normal  Adrenals/Urinary Tract: The adrenals and kidneys are normal in appearance. There is no urinary calculus evident. There is no hydronephrosis or ureteral dilatation. Collecting systems and ureters appear unremarkable.  Stomach/Bowel: Small hiatal hernia. Small bowel is normal. Mild colonic diverticulosis. Moderate mural enhancement of the rectum and rectosigmoid. This is nonspecific but could represent proctitis or colitis.  Vascular/Lymphatic: The abdominal aorta is normal in caliber, heavily calcified.  Reproductive: Hysterectomy.  No adnexal abnormalities.  Other: No ascites.  No adenopathy.  Musculoskeletal: No significant musculoskeletal lesions. Moderately severe degenerative disc and facet changes are present.  IMPRESSION: 1. Moderate mural enhancement of the rectum and rectosigmoid, raising the question of proctitis or colitis. 2. Small hiatal hernia 3. Diverticulosis   Electronically Signed   By: Ellery Plunk M.D.   On: 01/10/2015 22:39    ____________________________________________   PROCEDURES  Procedure(s) performed: None  Critical  Care performed: No ____________________________________________   INITIAL IMPRESSION / ASSESSMENT AND PLAN / ED COURSE  Pertinent labs & imaging results that were available during my care of the patient were reviewed by me and considered in my medical decision making (see chart for details).  81 y.o. female with a history of advanced dementia, COPD, presenting for 2 days of nausea vomiting and jaundice weakness. Here, the patient continues to be hypoxic to the high 80s on room air; it is unclear whether this is close to her baseline or new finding for her so a get gas to further evaluate this. We'll also check her for urinary tract infection or intra-abdominal infection with CT scan. She is on warfarin, and vomiting can be due to intracranial bleeding, so CT of the head is also ordered. At this time, this patient's vital signs with supplemental oxygen are stable and I will plan reevaluation.  _________________________________ ----------------------------------------- 8:11  PM on 01/10/2015 -----------------------------------------  The patient's family has arrived and are able to give me more information. Per the patient's daughter, the patient had a small episode of "spitting up" yesterday but otherwise has not had any other vomiting, change in her chronic diarrhea, or abdominal pain. The family reports that throughout the day the patient has become more and more weak and now is unable to ambulate without significant assistance. They also report that she seems more somnolent than usual. No cough or cold symptoms.   ----------------------------------------- 10:45 PM on 01/10/2015 -----------------------------------------  The patient CT abdomen does show findings consistent with colitis; this matches her clinical history. At this time she does not appear to have any complications such as abscess or perforation. She remains stable but will be admitted for further hydration, reversal of her  likely abnormalities and intravenous antibiotics.  FINAL CLINICAL IMPRESSION(S) / ED DIAGNOSES  Final diagnoses:  Atelectasis  Hypokalemia  Non-intractable vomiting with nausea, vomiting of unspecified type  Colitis      NEW MEDICATIONS STARTED DURING THIS VISIT:  New Prescriptions   No medications on file     Rockne Menghini, MD 01/10/15 2246

## 2015-01-10 NOTE — ED Notes (Signed)
Pt to rm 7 via EMS from home, pt's family c/o weakness, n/v x 2 days.  EMS report no vomiting for past few hours.  Pt hx dementia, afib, CHF, osteoporosis, and sleep apnea.  VS stable en route except spo2 88% on RA, 94% on 2L.  Pt 88% RA upon arrival, 2Lo2 applied.  Pt denies pain.  Pt NAD at this time.

## 2015-01-11 DIAGNOSIS — K529 Noninfective gastroenteritis and colitis, unspecified: Secondary | ICD-10-CM | POA: Diagnosis present

## 2015-01-11 LAB — BLOOD GAS, ARTERIAL
ACID-BASE EXCESS: 2.1 mmol/L (ref 0.0–3.0)
Allens test (pass/fail): POSITIVE — AB
Bicarbonate: 25.5 mEq/L (ref 21.0–28.0)
FIO2: 0.24
O2 Saturation: 96.8 %
PCO2 ART: 35 mmHg (ref 32.0–48.0)
PH ART: 7.47 — AB (ref 7.350–7.450)
Patient temperature: 37
pO2, Arterial: 83 mmHg (ref 83.0–108.0)

## 2015-01-11 LAB — COMPREHENSIVE METABOLIC PANEL
ALK PHOS: 50 U/L (ref 38–126)
ALT: 25 U/L (ref 14–54)
ANION GAP: 8 (ref 5–15)
AST: 25 U/L (ref 15–41)
Albumin: 3.2 g/dL — ABNORMAL LOW (ref 3.5–5.0)
BILIRUBIN TOTAL: 0.9 mg/dL (ref 0.3–1.2)
BUN: 15 mg/dL (ref 6–20)
CALCIUM: 7.7 mg/dL — AB (ref 8.9–10.3)
CO2: 27 mmol/L (ref 22–32)
Chloride: 101 mmol/L (ref 101–111)
Creatinine, Ser: 1.02 mg/dL — ABNORMAL HIGH (ref 0.44–1.00)
GFR, EST AFRICAN AMERICAN: 57 mL/min — AB (ref >60)
GFR, EST NON AFRICAN AMERICAN: 49 mL/min — AB (ref >60)
GLUCOSE: 99 mg/dL (ref 65–99)
POTASSIUM: 3.6 mmol/L (ref 3.5–5.1)
Sodium: 136 mmol/L (ref 135–145)
TOTAL PROTEIN: 6.7 g/dL (ref 6.5–8.1)

## 2015-01-11 LAB — PROTIME-INR
INR: 1.5
Prothrombin Time: 18.3 seconds — ABNORMAL HIGH (ref 11.4–15.0)

## 2015-01-11 LAB — GLUCOSE, CAPILLARY
Glucose-Capillary: 90 mg/dL (ref 65–99)
Glucose-Capillary: 114 mg/dL — ABNORMAL HIGH (ref 65–99)

## 2015-01-11 LAB — POTASSIUM: Potassium: 3.6 mmol/L (ref 3.5–5.1)

## 2015-01-11 LAB — TSH: TSH: 0.587 u[IU]/mL (ref 0.350–4.500)

## 2015-01-11 LAB — HEMOGLOBIN A1C: Hgb A1c MFr Bld: 6.2 % — ABNORMAL HIGH (ref 4.0–6.0)

## 2015-01-11 LAB — AMMONIA: Ammonia: 14 umol/L (ref 9–35)

## 2015-01-11 LAB — MAGNESIUM
MAGNESIUM: 0.8 mg/dL — AB (ref 1.7–2.4)
Magnesium: 2.3 mg/dL (ref 1.7–2.4)

## 2015-01-11 LAB — LACTIC ACID, PLASMA
LACTIC ACID, VENOUS: 1 mmol/L (ref 0.5–2.0)
Lactic Acid, Venous: 1.7 mmol/L (ref 0.5–2.0)

## 2015-01-11 MED ORDER — SODIUM CHLORIDE 0.9 % IV BOLUS (SEPSIS)
500.0000 mL | Freq: Once | INTRAVENOUS | Status: AC
  Administered 2015-01-11: 500 mL via INTRAVENOUS

## 2015-01-11 MED ORDER — SODIUM CHLORIDE 0.9 % IV BOLUS (SEPSIS): 500.0000 mL | Freq: Once | INTRAVENOUS | Status: DC

## 2015-01-11 MED ORDER — MIRTAZAPINE 15 MG PO TABS
15.0000 mg | ORAL_TABLET | Freq: Every day | ORAL | Status: DC
  Filled 2015-01-11: qty 1

## 2015-01-11 MED ORDER — WARFARIN SODIUM 1 MG PO TABS: 1.5000 mg | ORAL_TABLET | ORAL | Status: DC

## 2015-01-11 MED ORDER — POTASSIUM CHLORIDE IN NACL 40-0.9 MEQ/L-% IV SOLN
INTRAVENOUS | Status: DC
  Administered 2015-01-11: 100 mL/h via INTRAVENOUS
  Filled 2015-01-11 (×3): qty 1000

## 2015-01-11 MED ORDER — POTASSIUM CHLORIDE IN NACL 20-0.9 MEQ/L-% IV SOLN: INTRAVENOUS | Status: DC

## 2015-01-11 MED ORDER — POTASSIUM CHLORIDE IN NACL 20-0.9 MEQ/L-% IV SOLN
INTRAVENOUS | Status: DC
  Administered 2015-01-11: 125 mL/h via INTRAVENOUS
  Administered 2015-01-11 (×2): via INTRAVENOUS
  Administered 2015-01-12: 125 mL/h via INTRAVENOUS
  Filled 2015-01-11 (×4): qty 1000

## 2015-01-11 MED ORDER — FLUTICASONE PROPIONATE 50 MCG/ACT NA SUSP
1.0000 | Freq: Every day | NASAL | Status: DC
  Administered 2015-01-11 – 2015-01-15 (×5): 1 via NASAL
  Filled 2015-01-11: qty 16

## 2015-01-11 MED ORDER — WARFARIN SODIUM 3 MG PO TABS
1.5000 mg | ORAL_TABLET | ORAL | Status: DC
  Administered 2015-01-12 – 2015-01-14 (×2): 1.5 mg via ORAL
  Filled 2015-01-11: qty 1
  Filled 2015-01-11: qty 2

## 2015-01-11 MED ORDER — VANCOMYCIN HCL IN DEXTROSE 750-5 MG/150ML-% IV SOLN
750.0000 mg | INTRAVENOUS | Status: DC
  Filled 2015-01-11: qty 150

## 2015-01-11 MED ORDER — PIPERACILLIN-TAZOBACTAM 4.5 G IVPB
4.5000 g | Freq: Three times a day (TID) | INTRAVENOUS | Status: DC
  Administered 2015-01-11 – 2015-01-12 (×2): 4.5 g via INTRAVENOUS
  Filled 2015-01-11 (×4): qty 100

## 2015-01-11 MED ORDER — TRAMADOL HCL 50 MG PO TABS
50.0000 mg | ORAL_TABLET | Freq: Four times a day (QID) | ORAL | Status: DC | PRN
  Administered 2015-01-12: 50 mg via ORAL
  Filled 2015-01-11: qty 1

## 2015-01-11 MED ORDER — FUROSEMIDE 40 MG PO TABS
40.0000 mg | ORAL_TABLET | Freq: Every day | ORAL | Status: DC
  Filled 2015-01-11: qty 1

## 2015-01-11 MED ORDER — WARFARIN SODIUM 2 MG PO TABS
1.0000 mg | ORAL_TABLET | ORAL | Status: DC
  Administered 2015-01-13 – 2015-01-15 (×2): 1 mg via ORAL
  Filled 2015-01-11 (×3): qty 1

## 2015-01-11 MED ORDER — NOREPINEPHRINE BITARTRATE 1 MG/ML IV SOLN: 0.0000 ug/min | INTRAVENOUS | Status: DC

## 2015-01-11 MED ORDER — MAGNESIUM SULFATE 4 GM/100ML IV SOLN
4.0000 g | Freq: Once | INTRAVENOUS | Status: AC
  Administered 2015-01-11: 4 g via INTRAVENOUS
  Filled 2015-01-11: qty 100

## 2015-01-11 MED ORDER — HEPARIN SODIUM (PORCINE) 5000 UNIT/ML IJ SOLN: 5000.0000 [IU] | Freq: Three times a day (TID) | INTRAMUSCULAR | Status: DC

## 2015-01-11 MED ORDER — METRONIDAZOLE IN NACL 5-0.79 MG/ML-% IV SOLN
500.0000 mg | Freq: Three times a day (TID) | INTRAVENOUS | Status: DC
  Administered 2015-01-11: 500 mg via INTRAVENOUS
  Filled 2015-01-11 (×5): qty 100

## 2015-01-11 MED ORDER — WARFARIN SODIUM 1 MG PO TABS
0.5000 mg | ORAL_TABLET | Freq: Once | ORAL | Status: AC
  Administered 2015-01-11: 0.5 mg via ORAL
  Filled 2015-01-11: qty 1

## 2015-01-11 MED ORDER — CIPROFLOXACIN IN D5W 400 MG/200ML IV SOLN
400.0000 mg | Freq: Two times a day (BID) | INTRAVENOUS | Status: DC
  Filled 2015-01-11 (×2): qty 200

## 2015-01-11 MED ORDER — NYSTATIN 100000 UNIT/GM EX CREA
1.0000 "application " | TOPICAL_CREAM | Freq: Two times a day (BID) | CUTANEOUS | Status: DC
  Administered 2015-01-12 – 2015-01-14 (×5): 1 via TOPICAL
  Filled 2015-01-11: qty 15

## 2015-01-11 MED ORDER — NOREPINEPHRINE 4 MG/250ML-% IV SOLN: 0.0000 ug/min | INTRAVENOUS | Status: DC

## 2015-01-11 MED ORDER — WARFARIN SODIUM 1 MG PO TABS
1.0000 mg | ORAL_TABLET | Freq: Every day | ORAL | Status: DC
  Administered 2015-01-11: 1 mg via ORAL
  Filled 2015-01-11: qty 1

## 2015-01-11 MED ORDER — ONDANSETRON HCL 4 MG PO TABS: 4.0000 mg | ORAL_TABLET | Freq: Four times a day (QID) | ORAL | Status: DC

## 2015-01-11 MED ORDER — POLYETHYLENE GLYCOL 3350 17 G PO PACK: 17.0000 g | PACK | Freq: Every day | ORAL | Status: DC | PRN

## 2015-01-11 MED ORDER — DILTIAZEM HCL ER COATED BEADS 180 MG PO CP24: 360.0000 mg | ORAL_CAPSULE | Freq: Every day | ORAL | Status: DC

## 2015-01-11 MED ORDER — ALENDRONATE SODIUM 70 MG PO TABS: 70.0000 mg | ORAL_TABLET | ORAL | Status: DC

## 2015-01-11 MED ORDER — PANTOPRAZOLE SODIUM 40 MG PO TBEC
40.0000 mg | DELAYED_RELEASE_TABLET | Freq: Every day | ORAL | Status: DC
  Administered 2015-01-11 – 2015-01-15 (×5): 40 mg via ORAL
  Filled 2015-01-11 (×5): qty 1

## 2015-01-11 MED ORDER — WARFARIN SODIUM 1 MG PO TABS: 1.5000 mg | ORAL_TABLET | Freq: Every day | ORAL | Status: DC

## 2015-01-11 MED ORDER — VANCOMYCIN HCL IN DEXTROSE 1-5 GM/200ML-% IV SOLN
1000.0000 mg | INTRAVENOUS | Status: AC
Start: 2015-01-11 — End: 2015-01-11
  Administered 2015-01-11: 1000 mg via INTRAVENOUS
  Filled 2015-01-11: qty 200

## 2015-01-11 MED ORDER — INSULIN ASPART 100 UNIT/ML ~~LOC~~ SOLN
0.0000 [IU] | Freq: Three times a day (TID) | SUBCUTANEOUS | Status: DC
  Administered 2015-01-12: 1 [IU] via SUBCUTANEOUS
  Administered 2015-01-13: 2 [IU] via SUBCUTANEOUS
  Administered 2015-01-14: 12:00:00 1 [IU] via SUBCUTANEOUS
  Filled 2015-01-11: qty 2
  Filled 2015-01-11 (×2): qty 1

## 2015-01-11 MED ORDER — LISINOPRIL 20 MG PO TABS: 20.0000 mg | ORAL_TABLET | Freq: Every day | ORAL | Status: DC

## 2015-01-11 MED ORDER — ONDANSETRON HCL 4 MG PO TABS: 4.0000 mg | ORAL_TABLET | Freq: Four times a day (QID) | ORAL | Status: DC | PRN

## 2015-01-11 MED ORDER — ONDANSETRON HCL 4 MG/2ML IJ SOLN: 4.0000 mg | Freq: Four times a day (QID) | INTRAMUSCULAR | Status: DC | PRN

## 2015-01-11 MED ORDER — GABAPENTIN 100 MG PO CAPS
100.0000 mg | ORAL_CAPSULE | Freq: Three times a day (TID) | ORAL | Status: DC
  Administered 2015-01-11 – 2015-01-15 (×12): 100 mg via ORAL
  Filled 2015-01-11 (×13): qty 1

## 2015-01-11 MED ORDER — ACETAMINOPHEN 500 MG PO TABS
1000.0000 mg | ORAL_TABLET | Freq: Three times a day (TID) | ORAL | Status: DC
  Administered 2015-01-11 – 2015-01-12 (×5): 1000 mg via ORAL
  Filled 2015-01-11 (×5): qty 2

## 2015-01-11 MED ORDER — SODIUM CHLORIDE 0.9 % IJ SOLN
3.0000 mL | Freq: Two times a day (BID) | INTRAMUSCULAR | Status: DC
  Administered 2015-01-11 – 2015-01-15 (×8): 3 mL via INTRAVENOUS

## 2015-01-11 MED ORDER — VITAMIN D (ERGOCALCIFEROL) 1.25 MG (50000 UNIT) PO CAPS
50000.0000 [IU] | ORAL_CAPSULE | ORAL | Status: DC
Start: 2015-01-11 — End: 2015-01-15
  Filled 2015-01-11 (×2): qty 1

## 2015-01-11 NOTE — Progress Notes (Signed)
Alendronate d/c per policy. Please reorder with discharge medications.

## 2015-01-11 NOTE — Progress Notes (Signed)
Dr Winona Legato  Notified that pt remains bradycardic at 56. abgs have been drawn. More lethargic

## 2015-01-11 NOTE — Care Management Important Message (Signed)
Important Message  Patient Details  Name: YATZIRY DEAKINS MRN: [id number] Date of Birth: 1929/01/19   Medicare Important Message Given:  Yes-second notification given    Collie Siad, RN 01/11/2015, 8:21 AM

## 2015-01-11 NOTE — Progress Notes (Addendum)
[facility] [place] [facility]   PATIENT NAME: [name] Cunningham    MR#:  [id number]  DATE OF BIRTH:  [birth year]  SUBJECTIVE:  CHIEF COMPLAINT:   Chief Complaint  Patient presents with  . Weakness   Patient is 81 year old Caucasian female who presents to the hospital with complaints of weakness and nausea, she also had an episode of emesis. On arrival to emergency room, she was noted to be hypoxic with oxygen saturations in the 80s. She was also noted to be hypotensive, somnolent, her labs revealed hypokalemia with potassium level of 2.9. Patient denies any significant abdominal pains on nausea at present, however, remains very somnolent and drifts to sleep immediately after not stimulated. ABGs were performed and they were unremarkable. Patient is hypotensive despite IV fluid administration. She is transferred to intensive care unit for further treatment. Radiologic studies revealed atelectasis of basal lung area and likely proctitis or colitis Review of Systems  Unable to perform ROS: critical illness    VITAL SIGNS: Blood pressure 87/49, pulse 55, temperature 96.8 F (36 C), temperature source Axillary, resp. rate 18, height  (1.651 m), weight 67.541 kg (148 lb 14.4 oz), SpO2 96 %.  PHYSICAL EXAMINATION:   GENERAL:  81 y.o.-year-old patient sitting in the chair no acute distress. Very somnolent and drifts to sleep after discontinuation of stimulation. Able to open her eyes and converses appropriately. Answers questions and follows commands EYES: Pupils equal, round, reactive to light and accommodation. No scleral icterus. Extraocular muscles intact.  HEENT: Head atraumatic, normocephalic. Oropharynx and nasopharynx clear.  NECK:  Supple, no jugular venous distention. No thyroid enlargement, no tenderness.  LUNGS: Normal breath sounds bilaterally, no wheezing, rales,rhonchi or crepitation. No use of accessory muscles of respiration.  CARDIOVASCULAR:  S1, S2 normal. No murmurs, rubs, or gallops.  ABDOMEN: Soft, nontender, nondistended. Bowel sounds present. No organomegaly or mass.  EXTREMITIES: No pedal edema, cyanosis, or clubbing.  NEUROLOGIC: Cranial nerves II through XII are intact. Muscle strength 5/5 in all extremities. Sensation intact. Gait not checked.  PSYCHIATRIC: The patient is alert and oriented x 3.  SKIN: No obvious rash, lesion, or ulcer.   ORDERS/RESULTS REVIEWED:   CBC  Recent Labs Lab 01/10/15 2027  WBC 21.4*  HGB 12.8  HCT 37.9  PLT 263  MCV 84.8  MCH 28.6  MCHC 33.8  RDW 14.0   ------------------------------------------------------------------------------------------------------------------  Chemistries   Recent Labs Lab 01/10/15 2027 01/11/15 0139 01/11/15 0827  NA 134*  --   --   K 2.9*  --  3.6  CL 93*  --   --   CO2 29  --   --   GLUCOSE 148*  --   --   BUN 17  --   --   CREATININE 1.01*  --   --   CALCIUM 8.2*  --   --   MG  --  0.8*  --   AST 49*  --   --   ALT 36  --   --   ALKPHOS 52  --   --   BILITOT 1.0  --   --    ------------------------------------------------------------------------------------------------------------------ estimated creatinine clearance is 37.3 mL/min (by C-G formula based on Cr of 1.01). ------------------------------------------------------------------------------------------------------------------ No results for input(s): TSH, T4TOTAL, T3FREE, THYROIDAB in the last 72 hours.  Invalid input(s): FREET3  Cardiac Enzymes  Recent Labs Lab 01/10/15 2027  TROPONINI <0.03   ------------------------------------------------------------------------------------------------------------------ Invalid input(s): POCBNP ---------------------------------------------------------------------------------------------------------------  RADIOLOGY: Dg Chest 2 View  01/10/2015   CLINICAL DATA:  Acute onset of nausea, vomiting and weakness. Initial encounter.  EXAM:  CHEST  2 VIEW  COMPARISON:  Chest radiograph performed 06/20/2012  FINDINGS: The lungs are well-aerated. Minimal left basilar opacity likely reflects atelectasis. Pulmonary vascularity is at the upper limits of normal. There is no evidence of pleural effusion or pneumothorax.  The heart is normal in size; the mediastinal contour is within normal limits. No acute osseous abnormalities are seen.  IMPRESSION: Minimal left basilar opacity likely reflects atelectasis. Lungs otherwise grossly clear.   Electronically Signed   By: Roanna Raider M.D.   On: 01/10/2015 21:26   Ct Head Wo Contrast  01/10/2015   CLINICAL DATA:  weakness, n/v x 2 days. no vomiting for past few hours. Pt hx dementia, afib, CHF, osteoporosis, and sleep apnea. VS stable en route except spo2 88% on RA, 94% on 2L.  EXAM: CT HEAD WITHOUT CONTRAST  TECHNIQUE: Contiguous axial images were obtained from the base of the skull through the vertex without intravenous contrast.  COMPARISON:  05/31/2010  FINDINGS: Atherosclerotic and physiologic intracranial calcifications. Diffuse parenchymal atrophy. Patchy areas of hypoattenuation in deep and periventricular white matter bilaterally. Negative for acute intracranial hemorrhage, mass lesion, acute infarction, midline shift, or mass-effect. Acute infarct may be inapparent on noncontrast CT. Ventricles and sulci symmetric. Bone windows demonstrate no focal lesion.  IMPRESSION: 1. Negative for bleed or other acute intracranial process. 2. Atrophy and nonspecific white matter changes.   Electronically Signed   By: Corlis Leak M.D.   On: 01/10/2015 20:52   Ct Abdomen Pelvis W Contrast  01/10/2015   CLINICAL DATA:  Weakness, nausea and vomiting for 2 days.  EXAM: CT ABDOMEN AND PELVIS WITH CONTRAST  TECHNIQUE: Multidetector CT imaging of the abdomen and pelvis was performed using the standard protocol following bolus administration of intravenous contrast.  CONTRAST:  75mL OMNIPAQUE IOHEXOL 300 MG/ML  SOLN   COMPARISON:  None.  FINDINGS: Lower chest: Sub cm nodule in the posterior left lower lobe, probably not significantly changed from 03/30/2009. No acute findings in the lower chest.  Hepatobiliary: Prior cholecystectomy. Liver and bile ducts are unremarkable.  Pancreas: Normal  Spleen: Normal  Adrenals/Urinary Tract: The adrenals and kidneys are normal in appearance. There is no urinary calculus evident. There is no hydronephrosis or ureteral dilatation. Collecting systems and ureters appear unremarkable.  Stomach/Bowel: Small hiatal hernia. Small bowel is normal. Mild colonic diverticulosis. Moderate mural enhancement of the rectum and rectosigmoid. This is nonspecific but could represent proctitis or colitis.  Vascular/Lymphatic: The abdominal aorta is normal in caliber, heavily calcified.  Reproductive: Hysterectomy.  No adnexal abnormalities.  Other: No ascites.  No adenopathy.  Musculoskeletal: No significant musculoskeletal lesions. Moderately severe degenerative disc and facet changes are present.  IMPRESSION: 1. Moderate mural enhancement of the rectum and rectosigmoid, raising the question of proctitis or colitis. 2. Small hiatal hernia 3. Diverticulosis   Electronically Signed   By: Ellery Plunk M.D.   On: 01/10/2015 22:39    EKG:  Orders placed or performed during the hospital encounter of 01/10/15  . ED EKG  . ED EKG  . EKG 12-Lead  . EKG 12-Lead    ASSESSMENT AND PLAN:  Active Problems:   Colitis 1. Shock, questionable septic versus distributive, continue IV fluids at a high rate. Add Levophed keeping map at 65 and above 2. Hypoxia, unclear etiology, initially thought to be possibly due to obstructive sleep apnea, although ABGs were unremarkable, rule out  pulmonary embolism, getting CT scan of chest with IV contrast 3. Altered mental status, which is likely metabolic encephalopathy due to hypoxemia and hypotension. Follow clinically, supportive therapy. Head CT was unremarkable. We  may need to get MRI of brain. If mental status does not improve with improvement of her oxygenation and blood pressure.  4. Bradycardia, getting TSH 5. Hypokalemia as to be improving with supplementation. Follow-up in the morning 6. Hypomagnesemia supplementing IV, following levels 7. Renal insufficiency. Follow after CT angiogram. Continue IV fluids 8. Questionable colitis, continue Cipro and Flagyl intravenously. Follow white blood cell count, getting gastroenterology consultation. Patient is relatively asymptomatic 9. Hyperglycemia with hemoglobin A1c 6.2, not yet Diabetes mellitus, sliding scale insulin, diabetic diet  Management plans discussed with the patient, family and they are in agreement.   DRUG ALLERGIES:  Allergies  Allergen Reactions  . Hydroxyzine Hcl Other (See Comments)    Makes patient feel like she does not want to live    CODE STATUS:     Code Status Orders        Start     Ordered   01/11/15 0104  Full code   Continuous     01/11/15 0104    Advance Directive Documentation        Most Recent Value   Type of Advance Directive  Healthcare Power of Attorney, Living will   Pre-existing out of facility DNR order (yellow form or pink MOST form)     "MOST" Form in Place?        TOTAL CRITICAL CARE TIME TAKING CARE OF THIS PATIENT: 50 minutes.    Katharina Caper M.D on 01/11/2015 at 2:42 PM  Between 7am to 6pm - Pager - [phone]  After 6pm go to [email or web address] - password [email or web address]  [facility]  Office  [phone]  CC: Primary care physician; No primary care provider on file.

## 2015-01-11 NOTE — Progress Notes (Signed)
ANTIBIOTIC CONSULT NOTE - INITIAL  Pharmacy Consult for Vancomycin/Zosyn  Indication: sepsis  Allergies  Allergen Reactions  . Hydroxyzine Hcl Other (See Comments)    Makes patient feel like she does not want to live    Patient Measurements: Height:  (165.1 cm) Weight: 148 lb 14.4 oz (67.541 kg) IBW/kg (Calculated) : 57 Adjusted Body Weight:   Vital Signs: Temp: 97.9 F (36.6 C) (10/07 1400) Temp Source: Axillary (10/07 1055) BP: 114/49 mmHg (10/07 2100) Pulse Rate: 61 (10/07 2100) Intake/Output from previous day: 10/06 0701 - 10/07 0700 In: 266.7 [I.V.:266.7] Out: -  Intake/Output from this shift:    Labs:  Recent Labs  01/10/15 2027 01/11/15 1651  WBC 21.4*  --   HGB 12.8  --   PLT 263  --   CREATININE 1.01* 1.02*   Estimated Creatinine Clearance: 36.9 mL/min (by C-G formula based on Cr of 1.02). No results for input(s): VANCOTROUGH, VANCOPEAK, VANCORANDOM, GENTTROUGH, GENTPEAK, GENTRANDOM, TOBRATROUGH, TOBRAPEAK, TOBRARND, AMIKACINPEAK, AMIKACINTROU, AMIKACIN in the last 72 hours.   Microbiology: No results found for this or any previous visit (from the past 720 hour(s)).  Medical History: Past Medical History  Diagnosis Date  . HYPERLIPIDEMIA 01/06/2007  . HYPERTENSION 01/06/2007  . COPD 01/06/2007  . ANXIETY 11/21/2008  . SLEEP APNEA 11/21/2008  . DIVERTICULITIS OF COLON 07/23/2009  . Fistula   . GERD 01/06/2007  . PAD (peripheral artery disease) (HCC)   . CAD (coronary artery disease)     Mild by cath in remote past  . Atrial fibrillation (HCC)   . CHF (congestive heart failure) (HCC)     Medications:  Prescriptions prior to admission  Medication Sig Dispense Refill Last Dose  . acetaminophen (TYLENOL) 500 MG tablet Take 1,000 mg by mouth 3 (three) times daily.    unknown at unknown  . alendronate (FOSAMAX) 70 MG tablet Take 70 mg by mouth once a week. Take with a full glass of water on an empty stomach.   unknown at unknown  . diltiazem  (CARDIZEM CD) 360 MG 24 hr capsule Take 1 capsule (360 mg total) by mouth daily. 30 capsule 6 unknown at unknown  . fluticasone (FLONASE) 50 MCG/ACT nasal spray USE 2 SPRAYS IN NOSE DAILY AS DIRECTED 16 g 5 unknown at unknown  . furosemide (LASIX) 40 MG tablet Take 1 tablet (40 mg total) by mouth daily. 30 tablet 6 unknown at unknown  . gabapentin (NEURONTIN) 100 MG capsule Take 100 mg by mouth 3 (three) times daily.   unknown at unknown  . lisinopril (PRINIVIL,ZESTRIL) 10 MG tablet Take 2 tablets (20 mg total) by mouth daily. 30 tablet 11 unknown at unknown  . mirtazapine (REMERON) 15 MG tablet Take 15 mg by mouth at bedtime.   unknown at unknown  . nystatin cream (MYCOSTATIN) Apply 1 application topically 2 (two) times daily.   unknown at unknown  . omeprazole (PRILOSEC) 40 MG capsule Take 40 mg by mouth daily.   unknown at unknown  . ondansetron (ZOFRAN) 4 MG tablet Take 1 tablet (4 mg total) by mouth every 6 (six) hours. As needed for nausea or vomiting 12 tablet 0 01/09/2015 at Unknown time  . polyethylene glycol (MIRALAX / GLYCOLAX) packet Take 17 g by mouth daily as needed for mild constipation.    unknown at unknown  . traMADol (ULTRAM) 50 MG tablet Take 50 mg by mouth every 6 (six) hours as needed for moderate pain.    unknown at unknown  . Vitamin  D, Ergocalciferol, (DRISDOL) 50000 UNITS CAPS Take 50,000 Units by mouth every 30 (thirty) days.   unknown at unknown  . warfarin (COUMADIN) 1 MG tablet Take 1.5-2 mg by mouth daily. Take  by mouth on three days per week and take 1.5mg  by mouth on mondays, wednesdays, fridays, saturdays   unknown at unknown  . aspirin 81 MG tablet Take 1 tablet (81 mg total) by mouth daily. (Patient not taking: Reported on 01/10/2015) 30 tablet 0 Not Taking at Unknown time  . cilostazol (PLETAL) 50 MG tablet TAKE 1 TABLET (50 MG TOTAL) BY MOUTH 2 (TWO) TIMES DAILY. (Patient not taking: Reported on 01/10/2015) 60 tablet 11 Not Taking at Unknown time  .  oxyCODONE-acetaminophen (PERCOCET/ROXICET) 5-325 MG per tablet Take 1-2 tablets by mouth every 6 (six) hours as needed (Moderate to severe pain). (Patient not taking: Reported on 01/10/2015) 17 tablet 0 Not Taking at Unknown time  . VOLTAREN 1 % GEL APPLY TWICE A DAY TO SHOULDER AS NEEDED FOR PAIN (Patient not taking: Reported on 01/10/2015) 100 g 2 Completed Course at Unknown time   Assessment: Pseudomonas risk factors:  Pt currently on levophed.   CrCl = 36.9 ml/min Ke = 0.035 hr-1 T1/2 = 19.8 hrs Vd = 42.8 L  Goal of Therapy:  Vancomycin trough level 15-20 mcg/ml  Plan:  Expected duration 7 days with resolution of temperature and/or normalization of WBC   Will start Zosyn 4.5 gm IV Q8H EI.  Vancomycin 1 gm IV X 1 given on 10/07 @ 21:00. Vancomycin 750 mg IV Q24H ordered to start on 10/08 @ 10:00. This pt will reach Css by 10/11 @ 21:00.  Will draw 1st trough on 10/11 @ 9:30, which will not be at Css.   Lavanda Nevels D 01/11/2015,9:41 PM

## 2015-01-11 NOTE — Progress Notes (Addendum)
bp low 84/50 . Pulse 50. Dr Winona Legato notified. md ordered 500cc ns bolus. No further orders except start new iv site.bp meds and lasix to be held

## 2015-01-11 NOTE — Consult Note (Signed)
GI Inpatient Consult Note  Reason for Consult: Colitis   Attending Requesting Consult: Dr. Sheryle Hail  History of Present Illness: [name] Cunningham is a 81 y.o. female with a significant past medical history of AFib on Coumadin, heart failure, CAD, CAD, sleep apnea, COPD, diverticulosis came to the emergency department at [facility] after 2 days of weakness.  Patient lives with her daughter, Tommie Ard, who provided all of the history.  Patient has dementia and was very lethargic during my visit.  Patient's daughter reports that her mother started feeling dizzy on Wednesday,October 5, she reports that she checked her blood pressure which was low and assumed her mother may be dehydrated.  She reports after a little while her mother started to feel better and they went out to eat, although her mother still complained of weakness.  She reports yesterday her mother went with her to pick up granddaughter from school, was able to use her walker, but was still weak.  At 3 o'clock they went to pick up another granddaughter, this time she had to use her wheelchair, when they got back home she reports her mother was unable to get out of the car, was very lethargic.  The called paramedics and found her O2 to be 85%.  She reports that her mother has chronic diarrhea  will have 2-3 episodes in the morning and then seems to be better during the day.  She reports of partial colectomy occurred approximately 5-6 years ago in [place] for an episode of diverticulitis.  She reports a last colonoscopy was in 2009. Her last bm was yesterday morning. Denies any blood in stool or tissue.  She denies her mother ever complaining of any type of abdominal pain.  She reports taking a occasional fiber tablets to help with bowel movements, but not regularly.  Patient reports her mother has 2 sons that died from colon cancer, but does report that the father of the children has Lynch syndrome on his side of the family and  that none from her mother's side had any type of colon or rectal cancer.  Past Medical History:  Past Medical History  Diagnosis Date  . HYPERLIPIDEMIA 01/06/2007  . HYPERTENSION 01/06/2007  . COPD 01/06/2007  . ANXIETY 11/21/2008  . SLEEP APNEA 11/21/2008  . DIVERTICULITIS OF COLON 07/23/2009  . Fistula   . GERD 01/06/2007  . PAD (peripheral artery disease) (HCC)   . CAD (coronary artery disease)     Mild by cath in remote past  . Atrial fibrillation (HCC)   . CHF (congestive heart failure) (HCC)     Problem List: Patient Active Problem List   Diagnosis Date Noted  . Colitis 01/11/2015  . Diastolic CHF (HCC) 09/15/2011  . Atrial fibrillation (HCC) 09/15/2011  . PAD (peripheral artery disease) (HCC) 07/15/2011  . Edema of both legs 07/15/2011  . Palpitations 07/15/2011  . Back pain 01/28/2011  . TIA (transient ischemic attack) 06/11/2010  . TOBACCO USE 07/23/2009  . DIVERTICULITIS OF COLON 07/23/2009  . ANXIETY 11/21/2008  . SLEEP APNEA 11/21/2008  . CAD 01/04/2008  . PERIPHERAL VASCULAR DISEASE 01/04/2008  . HIATAL HERNIA 01/04/2008  . PULMONARY EMBOLISM, HX OF 01/04/2008  . DIABETES MELLITUS, TYPE II 01/17/2007  . Polymyalgia rheumatica (HCC) 01/12/2007  . HYPERLIPIDEMIA 01/06/2007  . HYPERTENSION 01/06/2007  . COPD 01/06/2007  . GERD 01/06/2007    Past Surgical History: Past Surgical History  Procedure Laterality Date  . Temporal artery biopsy / ligation    . Cataract  extraction    . Cholecystectomy    . Abdominal hysterectomy    . Appendectomy    . Partial colectomy      diverticulitis  . Colovaginal fistula repair      after partial colectomy  . Tubal ligation    . Gallbladder surgery    . Tonsillectomy  81 YEARS OLD    Allergies: Allergies  Allergen Reactions  . Hydroxyzine Hcl Other (See Comments)    Makes patient feel like she does not want to live    Home Medications: Prescriptions prior to admission  Medication Sig Dispense Refill Last Dose  .  acetaminophen (TYLENOL) 500 MG tablet Take 1,000 mg by mouth 3 (three) times daily.    unknown at unknown  . alendronate (FOSAMAX) 70 MG tablet Take 70 mg by mouth once a week. Take with a full glass of water on an empty stomach.   unknown at unknown  . diltiazem (CARDIZEM CD) 360 MG 24 hr capsule Take 1 capsule (360 mg total) by mouth daily. 30 capsule 6 unknown at unknown  . fluticasone (FLONASE) 50 MCG/ACT nasal spray USE 2 SPRAYS IN NOSE DAILY AS DIRECTED 16 g 5 unknown at unknown  . furosemide (LASIX) 40 MG tablet Take 1 tablet (40 mg total) by mouth daily. 30 tablet 6 unknown at unknown  . gabapentin (NEURONTIN) 100 MG capsule Take 100 mg by mouth 3 (three) times daily.   unknown at unknown  . lisinopril (PRINIVIL,ZESTRIL) 10 MG tablet Take 2 tablets (20 mg total) by mouth daily. 30 tablet 11 unknown at unknown  . mirtazapine (REMERON) 15 MG tablet Take 15 mg by mouth at bedtime.   unknown at unknown  . nystatin cream (MYCOSTATIN) Apply 1 application topically 2 (two) times daily.   unknown at unknown  . omeprazole (PRILOSEC) 40 MG capsule Take 40 mg by mouth daily.   unknown at unknown  . ondansetron (ZOFRAN) 4 MG tablet Take 1 tablet (4 mg total) by mouth every 6 (six) hours. As needed for nausea or vomiting 12 tablet 0 01/09/2015 at Unknown time  . polyethylene glycol (MIRALAX / GLYCOLAX) packet Take 17 g by mouth daily as needed for mild constipation.    unknown at unknown  . traMADol (ULTRAM) 50 MG tablet Take 50 mg by mouth every 6 (six) hours as needed for moderate pain.    unknown at unknown  . Vitamin D, Ergocalciferol, (DRISDOL) 50000 UNITS CAPS Take 50,000 Units by mouth every 30 (thirty) days.   unknown at unknown  . warfarin (COUMADIN) 1 MG tablet Take 1.5-2 mg by mouth daily. Take 1mg  by mouth on three days per week and take 1.5mg  by mouth on mondays, wednesdays, fridays, saturdays   unknown at unknown  . aspirin 81 MG tablet Take 1 tablet (81 mg total) by mouth daily. (Patient not  taking: Reported on 01/10/2015) 30 tablet 0 Not Taking at Unknown time  . cilostazol (PLETAL) 50 MG tablet TAKE 1 TABLET (50 MG TOTAL) BY MOUTH 2 (TWO) TIMES DAILY. (Patient not taking: Reported on 01/10/2015) 60 tablet 11 Not Taking at Unknown time  . oxyCODONE-acetaminophen (PERCOCET/ROXICET) 5-325 MG per tablet Take 1-2 tablets by mouth every 6 (six) hours as needed (Moderate to severe pain). (Patient not taking: Reported on 01/10/2015) 17 tablet 0 Not Taking at Unknown time  . VOLTAREN 1 % GEL APPLY TWICE A DAY TO SHOULDER AS NEEDED FOR PAIN (Patient not taking: Reported on 01/10/2015) 100 g 2 Completed Course at Unknown time  Home medication reconciliation was completed with the patient.   Scheduled Inpatient Medications:   . acetaminophen  1,000 mg Oral TID  . ciprofloxacin  400 mg Intravenous Q12H  . diltiazem  360 mg Oral Daily  . fluticasone  1 spray Each Nare Daily  . furosemide  40 mg Oral Daily  . gabapentin  100 mg Oral TID  . lisinopril  20 mg Oral Daily  . magnesium sulfate 1 - 4 g bolus IVPB  4 g Intravenous Once  . metronidazole  500 mg Intravenous Q8H  . mirtazapine  15 mg Oral QHS  . nystatin cream  1 application Topical BID  . ondansetron  4 mg Oral Q6H  . pantoprazole  40 mg Oral Daily  . sodium chloride  500 mL Intravenous Once  . sodium chloride  3 mL Intravenous Q12H  . Vitamin D (Ergocalciferol)  50,000 Units Oral Q30 days  . warfarin  1 mg Oral Daily  . [START ON 01/14/2015] warfarin  1.5 mg Oral Q Mon    Continuous Inpatient Infusions:   . 0.9 % NaCl with KCl 20 mEq / L      PRN Inpatient Medications:  ondansetron **OR** ondansetron (ZOFRAN) IV, polyethylene glycol, traMADol  Family History: family history includes Asthma in her father; Cancer in her mother; Colon cancer in her son; Diabetes in her daughter; Heart disease in her father.    Social History:   reports that she has been smoking Cigarettes.  She has been smoking about 1.50 packs per day.  She does not have any smokeless tobacco history on file. She reports that she does not drink alcohol or use illicit drugs.   Review of Systems: Constitutional: Weight is stable.  Eyes: No changes in vision. ENT: No oral lesions, sore throat.  GI: see HPI.  Heme/Lymph: No easy bruising.  CV: No chest pain.  GU: No hematuria.  Integumentary: No rashes.  Neuro: No headaches.  Psych: No depression/anxiety.  Endocrine: No heat/cold intolerance.  Allergic/Immunologic: No urticaria.  Resp: No cough,   Musculoskeletal: No joint swelling.    Physical Examination: BP 92/54 mmHg  Pulse 54  Temp(Src) 98.3 F (36.8 C) (Oral)  Resp 18  Ht  (1.651 m)  Wt 67.541 kg (148 lb 14.4 oz)  BMI 24.78 kg/m2  SpO2 95% Gen: patient is very lethargic, does not wake for physical exam Neck: supple, no JVD or thyromegaly Chest: CTA bilaterally, no wheezes, crackles, or other adventitious sounds CV:  irreg irreg Abd: soft, does not respond to any palpitation Ext: no edema, well perfused with 2+ pulses, Skin: no rash or lesions noted Lymph: no LAD  Data: Lab Results  Component Value Date   WBC 21.4* 01/10/2015   HGB 12.8 01/10/2015   HCT 37.9 01/10/2015   MCV 84.8 01/10/2015   PLT 263 01/10/2015    Recent Labs Lab 01/10/15 2027  HGB 12.8   Lab Results  Component Value Date   NA 134* 01/10/2015   K 3.6 01/11/2015   CL 93* 01/10/2015   CO2 29 01/10/2015   BUN 17 01/10/2015   CREATININE 1.01* 01/10/2015   Lab Results  Component Value Date   ALT 36 01/10/2015   AST 49* 01/10/2015   ALKPHOS 52 01/10/2015   BILITOT 1.0 01/10/2015    Recent Labs Lab 01/11/15 0139  INR 1.50  imaging CLINICAL DATA: Weakness, nausea and vomiting for 2 days.  EXAM: CT ABDOMEN AND PELVIS WITH CONTRAST  TECHNIQUE: Multidetector CT imaging of  the abdomen and pelvis was performed using the standard protocol following bolus administration of intravenous contrast.  CONTRAST: 75mL OMNIPAQUE  IOHEXOL 300 MG/ML SOLN  COMPARISON: None.  FINDINGS: Lower chest: Sub cm nodule in the posterior left lower lobe, probably not significantly changed from 03/30/2009. No acute findings in the lower chest.  Hepatobiliary: Prior cholecystectomy. Liver and bile ducts are unremarkable.  Pancreas: Normal  Spleen: Normal  Adrenals/Urinary Tract: The adrenals and kidneys are normal in appearance. There is no urinary calculus evident. There is no hydronephrosis or ureteral dilatation. Collecting systems and ureters appear unremarkable.  Stomach/Bowel: Small hiatal hernia. Small bowel is normal. Mild colonic diverticulosis. Moderate mural enhancement of the rectum and rectosigmoid. This is nonspecific but could represent proctitis or colitis.  Vascular/Lymphatic: The abdominal aorta is normal in caliber, heavily calcified.  Reproductive: Hysterectomy. No adnexal abnormalities.  Other: No ascites. No adenopathy.  Musculoskeletal: No significant musculoskeletal lesions. Moderately severe degenerative disc and facet changes are present.  IMPRESSION: 1. Moderate mural enhancement of the rectum and rectosigmoid, raising the question of proctitis or colitis. 2. Small hiatal hernia 3. Diverticulosis   Electronically Signed  By: Ellery Plunk M.D.  On: 01/10/2015 22:39  CLINICAL DATA: Acute onset of nausea, vomiting and weakness. Initial encounter.  EXAM: CHEST 2 VIEW  COMPARISON: Chest radiograph performed 06/20/2012  FINDINGS: The lungs are well-aerated. Minimal left basilar opacity likely reflects atelectasis. Pulmonary vascularity is at the upper limits of normal. There is no evidence of pleural effusion or pneumothorax.  The heart is normal in size; the mediastinal contour is within normal limits. No acute osseous abnormalities are seen.  IMPRESSION: Minimal left basilar opacity likely reflects atelectasis. Lungs otherwise grossly  clear.   Electronically Signed  By: Roanna Raider M.D.  On: 01/10/2015 21:26 Assessment/Plan: Ms. Goelz is a 81 y.o. female with possible proctitis or colitis.  No complaints of abdominal pain, has chronic diarrhea for years.  S/P partial colectomy 5-6 years ago in [place]. Her BP was 92/39, HR 54, in A fib, on Coumadin.  Her WBC was 21.4, she had electrolyte imbalances  Recommendations: We recommend culturing stool for WBC, and C diff if she has any more diarrhea. Due to multiple medical conditions at this time, we do not recommend any further evaluation of the possible proctitis or colitis with any type of endoscopy.  We will continue to follow with you Thank you for the consult. Please call with questions or concerns.  Carney Harder, PA-C  I personally performed these services.

## 2015-01-11 NOTE — Progress Notes (Addendum)
rn attempted to call report to [email or web address] with  Monitor clerk. She will have charge nurse pam call rn back. No bed approval yet. [facility] reports

## 2015-01-11 NOTE — H&P (Signed)
[name] Cunningham is an 81 y.o. female.   Chief Complaint: Weakness HPI: Patient presents emergency department via EMS after complaining of weakness. The patient has had nausea and progressive weakness over 2 days. Last night she had 1 episode of nonbloody nonbilious emesis. She was able to eat supper and keep food down. Her strength appeared to improve but she awoke today with weakness and appeared less alert throughout the day. She has been unable to walk or use her walker. She also appeared to have a tremor of her arm this evening which prompted her daughter to call EMS who found her to be hypoxic into the mid 80s. In the emergency department she required 2 L of oxygen via nasal cannula to maintain oxygen saturations in the 90s. She underwent CT scan of the abdomen which showed possible colitis. Chest x-ray shows basal atelectasis. Laboratory evaluation also revealed hypokalemia which prompted the emergency department staff to call for admission.  Past Medical History  Diagnosis Date  . HYPERLIPIDEMIA 01/06/2007  . HYPERTENSION 01/06/2007  . COPD 01/06/2007  . ANXIETY 11/21/2008  . SLEEP APNEA 11/21/2008  . DIVERTICULITIS OF COLON 07/23/2009  . Fistula   . GERD 01/06/2007  . PAD (peripheral artery disease) ([place])   . CAD (coronary artery disease)     Mild by cath in remote past  . Atrial fibrillation ([place])   . CHF (congestive heart failure) [facility])     Past Surgical History  Procedure Laterality Date  . Temporal artery biopsy / ligation    . Cataract extraction    . Cholecystectomy    . Abdominal hysterectomy    . Appendectomy    . Partial colectomy      diverticulitis  . Colovaginal fistula repair      after partial colectomy  . Tubal ligation    . Gallbladder surgery    . Tonsillectomy  81 YEARS OLD    Family History  Problem Relation Age of Onset  . Heart disease Father   . Asthma Father   . Diabetes Daughter   . Colon cancer Son   . Cancer Mother    Social History:  reports  that she has been smoking Cigarettes.  She has been smoking about 1.50 packs per day. She does not have any smokeless tobacco history on file. She reports that she does not drink alcohol or use illicit drugs.  Allergies:  Allergies  Allergen Reactions  . Hydroxyzine Hcl Other (See Comments)    Makes patient feel like she does not want to live    Prior to Admission medications   Medication Sig Start Date End Date Taking? Authorizing Provider  acetaminophen (TYLENOL) 500 MG tablet Take 1,000 mg by mouth 3 (three) times daily.    Yes Historical Provider, MD  alendronate (FOSAMAX) 70 MG tablet Take 70 mg by mouth once a week. Take with a full glass of water on an empty stomach.   Yes Historical Provider, MD  diltiazem (CARDIZEM CD) 360 MG 24 hr capsule Take 1 capsule (360 mg total) by mouth daily. 09/15/11 01/10/15 Yes Burnell Blanks, MD  fluticasone (FLONASE) 50 MCG/ACT nasal spray USE 2 SPRAYS IN NOSE DAILY AS DIRECTED 08/12/11  Yes Lisabeth Pick, MD  furosemide (LASIX) 40 MG tablet Take 1 tablet (40 mg total) by mouth daily. 09/15/11 01/10/15 Yes Burnell Blanks, MD  gabapentin (NEURONTIN) 100 MG capsule Take 100 mg by mouth 3 (three) times daily.   Yes Historical Provider, MD  lisinopril (PRINIVIL,ZESTRIL)  10 MG tablet Take 2 tablets (20 mg total) by mouth daily. 12/22/11  Yes Burnell Blanks, MD  mirtazapine (REMERON) 15 MG tablet Take 15 mg by mouth at bedtime. 12/22/11  Yes Historical Provider, MD  nystatin cream (MYCOSTATIN) Apply 1 application topically 2 (two) times daily.   Yes Historical Provider, MD  omeprazole (PRILOSEC) 40 MG capsule Take 40 mg by mouth daily.   Yes Historical Provider, MD  ondansetron (ZOFRAN) 4 MG tablet Take 1 tablet (4 mg total) by mouth every 6 (six) hours. As needed for nausea or vomiting 02/03/12  Yes John-Adam Bonk, MD  polyethylene glycol (MIRALAX / GLYCOLAX) packet Take 17 g by mouth daily as needed for mild constipation.    Yes Historical  Provider, MD  traMADol (ULTRAM) 50 MG tablet Take 50 mg by mouth every 6 (six) hours as needed for moderate pain.    Yes Historical Provider, MD  Vitamin D, Ergocalciferol, (DRISDOL) 50000 UNITS CAPS Take 50,000 Units by mouth every 30 (thirty) days.   Yes Historical Provider, MD  warfarin (COUMADIN) 1 MG tablet Take 1.5-2 mg by mouth daily. Take 102m by mouth on three days per week and take 1.522mby mouth on mondays, wednesdays, fridays, saturdays   Yes Historical Provider, MD  aspirin 81 MG tablet Take 1 tablet (81 mg total) by mouth daily. Patient not taking: Reported on 01/10/2015 07/30/11   ChBurnell BlanksMD  cilostazol (PLETAL) 50 MG tablet TAKE 1 TABLET (50 MG TOTAL) BY MOUTH 2 (TWO) TIMES DAILY. Patient not taking: Reported on 01/10/2015 02/14/12   ChBurnell BlanksMD  oxyCODONE-acetaminophen (PERCOCET/ROXICET) 5-325 MG per tablet Take 1-2 tablets by mouth every 6 (six) hours as needed (Moderate to severe pain). Patient not taking: Reported on 01/10/2015 02/03/12   John-Adam Bonk, MD  VOLTAREN 1 % GEL APPLY TWICE A DAY TO SHOULDER AS NEEDED FOR PAIN Patient not taking: Reported on 01/10/2015 10/21/11   BrLisabeth PickMD     Results for orders placed or performed during the hospital encounter of 01/10/15 (from the past 48 hour(s))  CBC     Status: Abnormal   Collection Time: 01/10/15  8:27 PM  Result Value Ref Range   WBC 21.4 (H) 3.6 - 11.0 K/uL   RBC 4.47 3.80 - 5.20 MIL/uL   Hemoglobin 12.8 12.0 - 16.0 g/dL   HCT 37.9 35.0 - 47.0 %   MCV 84.8 80.0 - 100.0 fL   MCH 28.6 26.0 - 34.0 pg   MCHC 33.8 32.0 - 36.0 g/dL   RDW 14.0 11.5 - 14.5 %   Platelets 263 150 - 440 K/uL  Urinalysis complete, with microscopic ([facility] only)     Status: Abnormal   Collection Time: 01/10/15  8:27 PM  Result Value Ref Range   Color, Urine YELLOW (A) YELLOW   APPearance CLEAR (A) CLEAR   Glucose, UA NEGATIVE NEGATIVE mg/dL   Bilirubin Urine NEGATIVE NEGATIVE   Ketones, ur NEGATIVE NEGATIVE  mg/dL   Specific Gravity, Urine 1.013 1.005 - 1.030   Hgb urine dipstick NEGATIVE NEGATIVE   pH 5.0 5.0 - 8.0   Protein, ur NEGATIVE NEGATIVE mg/dL   Nitrite NEGATIVE NEGATIVE   Leukocytes, UA NEGATIVE NEGATIVE   RBC / HPF 0-5 0 - 5 RBC/hpf   WBC, UA 0-5 0 - 5 WBC/hpf   Bacteria, UA NONE SEEN NONE SEEN   Squamous Epithelial / LPF 0-5 (A) NONE SEEN   Mucous PRESENT    Hyaline Casts, UA  PRESENT   Troponin I     Status: None   Collection Time: 01/10/15  8:27 PM  Result Value Ref Range   Troponin I <0.03 <0.031 ng/mL    Comment:        NO INDICATION OF MYOCARDIAL INJURY.   Comprehensive metabolic panel     Status: Abnormal   Collection Time: 01/10/15  8:27 PM  Result Value Ref Range   Sodium 134 (L) 135 - 145 mmol/L   Potassium 2.9 (LL) 3.5 - 5.1 mmol/L    Comment: CRITICAL RESULT CALLED TO, READ BACK BY AND VERIFIED WITH CHRISTINE KIM @2122  01/10/15.Marland KitchenMarland KitchenAJO    Chloride 93 (L) 101 - 111 mmol/L   CO2 29 22 - 32 mmol/L   Glucose, Bld 148 (H) 65 - 99 mg/dL   BUN 17 6 - 20 mg/dL   Creatinine, Ser 1.01 (H) 0.44 - 1.00 mg/dL   Calcium 8.2 (L) 8.9 - 10.3 mg/dL   Total Protein 7.3 6.5 - 8.1 g/dL   Albumin 3.6 3.5 - 5.0 g/dL   AST 49 (H) 15 - 41 U/L   ALT 36 14 - 54 U/L   Alkaline Phosphatase 52 38 - 126 U/L   Total Bilirubin 1.0 0.3 - 1.2 mg/dL   GFR calc non Af Amer 50 (L) >60 mL/min   GFR calc Af Amer 58 (L) >60 mL/min    Comment: (NOTE) The eGFR has been calculated using the CKD EPI equation. This calculation has not been validated in all clinical situations. eGFR's persistently <60 mL/min signify possible Chronic Kidney Disease.    Anion gap 12 5 - 15  Blood gas, venous     Status: Abnormal   Collection Time: 01/10/15  8:27 PM  Result Value Ref Range   pH, Ven 7.40 7.320 - 7.430   pCO2, Ven 52 44.0 - 60.0 mmHg   Bicarbonate 32.2 (H) 21.0 - 28.0 mEq/L   Acid-Base Excess 6.0 (H) 0.0 - 3.0 mmol/L   Patient temperature 37.0    Collection site RESISTANT    Sample type  VENOUS    Dg Chest 2 View  01/10/2015   CLINICAL DATA:  Acute onset of nausea, vomiting and weakness. Initial encounter.  EXAM: CHEST  2 VIEW  COMPARISON:  Chest radiograph performed 06/20/2012  FINDINGS: The lungs are well-aerated. Minimal left basilar opacity likely reflects atelectasis. Pulmonary vascularity is at the upper limits of normal. There is no evidence of pleural effusion or pneumothorax.  The heart is normal in size; the mediastinal contour is within normal limits. No acute osseous abnormalities are seen.  IMPRESSION: Minimal left basilar opacity likely reflects atelectasis. Lungs otherwise grossly clear.   Electronically Signed   By: Garald Balding M.D.   On: 01/10/2015 21:26   Ct Head Wo Contrast  01/10/2015   CLINICAL DATA:  weakness, n/v x 2 days. no vomiting for past few hours. Pt hx dementia, afib, CHF, osteoporosis, and sleep apnea. VS stable en route except spo2 88% on RA, 94% on 2L.  EXAM: CT HEAD WITHOUT CONTRAST  TECHNIQUE: Contiguous axial images were obtained from the base of the skull through the vertex without intravenous contrast.  COMPARISON:  05/31/2010  FINDINGS: Atherosclerotic and physiologic intracranial calcifications. Diffuse parenchymal atrophy. Patchy areas of hypoattenuation in deep and periventricular white matter bilaterally. Negative for acute intracranial hemorrhage, mass lesion, acute infarction, midline shift, or mass-effect. Acute infarct may be inapparent on noncontrast CT. Ventricles and sulci symmetric. Bone windows demonstrate no focal lesion.  IMPRESSION: 1.  Negative for bleed or other acute intracranial process. 2. Atrophy and nonspecific white matter changes.   Electronically Signed   By: Lucrezia Europe M.D.   On: 01/10/2015 20:52   Ct Abdomen Pelvis W Contrast  01/10/2015   CLINICAL DATA:  Weakness, nausea and vomiting for 2 days.  EXAM: CT ABDOMEN AND PELVIS WITH CONTRAST  TECHNIQUE: Multidetector CT imaging of the abdomen and pelvis was performed using  the standard protocol following bolus administration of intravenous contrast.  CONTRAST:  49m OMNIPAQUE IOHEXOL 300 MG/ML  SOLN  COMPARISON:  None.  FINDINGS: Lower chest: Sub cm nodule in the posterior left lower lobe, probably not significantly changed from 03/30/2009. No acute findings in the lower chest.  Hepatobiliary: Prior cholecystectomy. Liver and bile ducts are unremarkable.  Pancreas: Normal  Spleen: Normal  Adrenals/Urinary Tract: The adrenals and kidneys are normal in appearance. There is no urinary calculus evident. There is no hydronephrosis or ureteral dilatation. Collecting systems and ureters appear unremarkable.  Stomach/Bowel: Small hiatal hernia. Small bowel is normal. Mild colonic diverticulosis. Moderate mural enhancement of the rectum and rectosigmoid. This is nonspecific but could represent proctitis or colitis.  Vascular/Lymphatic: The abdominal aorta is normal in caliber, heavily calcified.  Reproductive: Hysterectomy.  No adnexal abnormalities.  Other: No ascites.  No adenopathy.  Musculoskeletal: No significant musculoskeletal lesions. Moderately severe degenerative disc and facet changes are present.  IMPRESSION: 1. Moderate mural enhancement of the rectum and rectosigmoid, raising the question of proctitis or colitis. 2. Small hiatal hernia 3. Diverticulosis   Electronically Signed   By: DAndreas NewportM.D.   On: 01/10/2015 22:39    Review of Systems  Constitutional: Positive for chills. Negative for fever.  HENT: Negative for sore throat and tinnitus.   Eyes: Negative for blurred vision and redness.  Respiratory: Negative for cough and shortness of breath.   Cardiovascular: Negative for chest pain, palpitations, orthopnea and PND.  Gastrointestinal: Positive for nausea and vomiting. Negative for abdominal pain and diarrhea.  Genitourinary: Negative for dysuria, urgency and frequency.  Musculoskeletal: Negative for myalgias and joint pain.  Skin: Negative for rash.        No lesions  Neurological: Positive for weakness. Negative for speech change and focal weakness.  Endo/Heme/Allergies: Does not bruise/bleed easily.       No temperature intolerance  Psychiatric/Behavioral: Negative for depression and suicidal ideas.    Blood pressure 123/50, pulse 67, temperature 98.9 F (37.2 C), temperature source Oral, resp. rate 18, height 5' 5"  (1.651 m), weight 66.271 kg (146 lb 1.6 oz), SpO2 96 %. Physical Exam  Nursing note and vitals reviewed. Constitutional: She is oriented to person, place, and time. She appears well-developed and well-nourished. No distress.  HENT:  Head: Normocephalic and atraumatic.  Mouth/Throat: Oropharynx is clear and moist.  Eyes: Conjunctivae and EOM are normal. Pupils are equal, round, and reactive to light. No scleral icterus.  Neck: Normal range of motion. Neck supple. No JVD present. No tracheal deviation present. No thyromegaly present.  Cardiovascular: Normal rate, regular rhythm and normal heart sounds.  Exam reveals no gallop and no friction rub.   No murmur heard. Respiratory: Effort normal and breath sounds normal.  GI: Soft. Bowel sounds are normal. She exhibits no distension. There is no tenderness.  Genitourinary:  Deferred  Musculoskeletal: Normal range of motion. She exhibits no edema.  Lymphadenopathy:    She has no cervical adenopathy.  Neurological: She is alert and oriented to person, place, and time. No cranial nerve  deficit. She exhibits normal muscle tone.  Skin: Skin is warm and dry.  Psychiatric: She has a normal mood and affect. Her behavior is normal. Judgment and thought content normal.     Assessment/Plan  This is an 81 year old Caucasian female admitted weakness, colitis and hypoxia.  1. Weakness: Generalized; no neurologic deficits. Likely secondary to hypokalemia and mild dehydration. Replete potassium and hydrate with intravenous fluids. 2. Colitis: Nonbloody. Continue Cipro and metronidazole 3.  Hypoxia: Underlying COPD but also concern for possible aspiration as the patient does not require supplemental O2 at night but has an oxygen requirement following episodes of vomiting. Given Levaquin in the emergency department. 4. CAD: Stable continue aspirin 5. Atrial fibrillation: Paroxysmal; continue warfarin and diltiazem 6. Hypertension: Continue lisinopril 7. DVT prophylaxis: As above 8. GI prophylaxis: Omeprazole The patient is a full code. Time spent on admission orders and patient care approximately 35 minutes  Harrie Foreman 01/11/2015, 12:42 AM

## 2015-01-11 NOTE — Progress Notes (Signed)
ANTICOAGULATION CONSULT NOTE - Initial Consult  Pharmacy Consult for warfarin Indication: atrial fibrillation  Allergies  Allergen Reactions  . Hydroxyzine Hcl Other (See Comments)    Makes patient feel like she does not want to live    Patient Measurements: Height:  (165.1 cm) Weight: 148 lb 14.4 oz (67.541 kg) IBW/kg (Calculated) : 57  Vital Signs: Temp: 96.8 F (36 C) (10/07 1055) Temp Source: Axillary (10/07 1055) BP: 87/49 mmHg (10/07 1209) Pulse Rate: 55 (10/07 1209)  Labs:  Recent Labs  01/10/15 2027 01/11/15 0139  HGB 12.8  --   HCT 37.9  --   PLT 263  --   LABPROT  --  18.3*  INR  --  1.50  CREATININE 1.01*  --   TROPONINI <0.03  --     Estimated Creatinine Clearance: 37.3 mL/min (by C-Cunningham formula based on Cr of 1.01).   Medical History: Past Medical History  Diagnosis Date  . HYPERLIPIDEMIA 01/06/2007  . HYPERTENSION 01/06/2007  . COPD 01/06/2007  . ANXIETY 11/21/2008  . SLEEP APNEA 11/21/2008  . DIVERTICULITIS OF COLON 07/23/2009  . Fistula   . GERD 01/06/2007  . PAD (peripheral artery disease) ([facility])   . CAD (coronary artery disease)     Mild by cath in remote past  . Atrial fibrillation ([facility])   . CHF (congestive heart failure) ([facility])     Medications:  Scheduled:  . acetaminophen  1,000 mg Oral TID  . ciprofloxacin  400 mg Intravenous Q12H  . fluticasone  1 spray Each Nare Daily  . gabapentin  100 mg Oral TID  . metronidazole  500 mg Intravenous Q8H  . mirtazapine  15 mg Oral QHS  . nystatin cream  1 application Topical BID  . ondansetron  4 mg Oral Q6H  . pantoprazole  40 mg Oral Daily  . sodium chloride  500 mL Intravenous Once  . sodium chloride  3 mL Intravenous Q12H  . Vitamin D (Ergocalciferol)  50,000 Units Oral Q30 days  . warfarin  0.5 mg Oral Once  . [START ON 01/13/2015] warfarin  1 mg Oral Once per day on Sun Tue Thu  . [START ON 01/12/2015] warfarin  1.5 mg Oral Once per day on Mon Wed Fri Sat    Assessment: Pharmacy  consulted to dose and monitor warfarin in an 81 yo female admitted to ICU with colitis.  Patient currently ordered Cipro 400 mg IV q12h and Metronidazole 500 mg IV q8h.  Per med rec, patient takes warfarin 1 mg on Tues, Thurs, Sun and 1.5 mg on all other days.   INR: 1.5 - received warfarin 1 mg today  Goal of Therapy:  INR 2-3 Monitor CBC per policy   Plan:  Will order 0.5 mg once to go along with 1 mg dose to make total dose of 1.5 mg for today.  Continue outpatient dosing as patient's INR on 9/12 was 2.1. Patient is also receiving IV antibiotic therapy with Metronidazole and Ciprofloxacin.  These agents may increase the INR.  Will check INR in AM to help guide dosing.   Pharmacy will continue to follow.  [name] Cunningham 01/11/2015,2:39 PM

## 2015-01-11 NOTE — Progress Notes (Signed)
rn spoke with dr Winona Legato . md orders  Repeat serum magnesium and  Insert foley cath if bladder scan >300

## 2015-01-11 NOTE — Progress Notes (Signed)
rn attempted  To call report to [facility] . Spoke with pam charge nurse who stated she will call rn back

## 2015-01-11 NOTE — Progress Notes (Signed)
Pt. Admitted to unit with colitis. Alert and oriented upon arrival but started to become confused after being here for a bit. VSS. No c/o pain. Running SR per telemetry monitor. Receiving IV antibiotics. Resting quietly. Will continue to monitor.

## 2015-01-11 NOTE — Progress Notes (Addendum)
Remains lethargic. Spoke with dr Winona Legato. bp low 84/50. Arousable. abgs wnl. md now  R/o sepsis. Will  Awake lactic acid results. Discussed with md probable  Need for transfer to a high level of care.dtr updated on condition of pt and concerns for need to transfer to another  Level of care

## 2015-01-11 NOTE — Progress Notes (Addendum)
Dr Winona Legato notifed of lactic acid 1.7. bp remains low. No u/o since 0549. Remains lethargic at times. Has completed  Magnesium bolus . Pt to be transferred to [facility].Marland Kitchen

## 2015-01-11 NOTE — Progress Notes (Signed)
Transferred to [facility] 10 after report called to sandra in [facility]cu.

## 2015-01-11 NOTE — Care Management Note (Addendum)
Case Management Note  Patient Details  Name: [name] Cunningham MRN: [id number] Date of Birth: October 13, 1928  Subjective/Objective:                  Met with patient and [name] Cunningham  (patient did not participate in assessment/was sleeping in recliner) to discuss discharge planning. Patient is from home with [name] daughter. Patient had been able to ride in a car and participate in activities with daughter prior to this admission. She typically uses a walker to ambulate. [name] PCP is Dr. Redmond Pulling with [facility]. She is currently open to physical therapy through [facility] Phone: [phone] Fax: [phone]. Spoke with [facility] and she states that they only provide OT and PT in home setting. If patient needs nursing or respiratory in the home setting RNCM will need to cancel PT services through [facility] will need to pick a home health agency that can supply patient's needs. If PT is the only service needed- resumption of care orders will need to be faxed to [facility] at number above. O2 is new. Assessments needed. Patient may need EMS to return home. PT would be beneficial since mobility status has changed. She uses [facility] on [address] for Rx.   Action/Plan: RNCM will continue to follow. List of home health agencies supplied. Delton See to contact Dr. Redmond Pulling to set up outpatient sleep study as she asked about CPAP machine. Patient had one in the past and did not wear it- overnight sleep oximetry might help patient too which can be done at [facility] or in the home setting with MD orders.   Expected Discharge Date:  01/14/15              Expected Discharge Plan:     In-House Referral:     Discharge planning Services  CM Consult  Post Acute Care Choice:  Durable Medical Equipment, Home Health Choice offered to:  Adult Children  DME Arranged:  Oxygen DME Agency:  [place] Arranged:  PT, RN [facility] Agency:      Status of Service:  In process, will continue to follow  Medicare Important Message Given:  Yes-second notification given Date Medicare IM Given:    Medicare IM give by:    Date Additional Medicare IM Given:    Additional Medicare Important Message give by:     If discussed at [place] of Stay Meetings, dates discussed:    Additional Comments:  Marshell Garfinkel, RN 01/11/2015, 10:08 AM

## 2015-01-11 NOTE — Consult Note (Signed)
Pt with altered mental status, hypotension, leukocytosis, likely sepsis uncertain origin.  She denies any gi complaints and her abdomen is soft and not tender.  Denies diarrhea or bleeding.  CT showed some thickening of rectosigmoid but no clinical significance.

## 2015-01-11 NOTE — Progress Notes (Signed)
Called Dr V patient is not responding to sternal rub. At this point MD does not want ABG, hold off on CT, DR V is adding neuro, blood cultures and lab work. Mag level within normal limit. Vitals are stable

## 2015-01-11 NOTE — Progress Notes (Signed)
Magnesium level .8. Dr Winona Legato notified . Stat dose of magnesium 4gm ordered along with repeat potassium.Marland Kitchen

## 2015-01-12 ENCOUNTER — Inpatient Hospital Stay (HOSPITAL_COMMUNITY)
Admit: 2015-01-12 | Discharge: 2015-01-12 | Disposition: A | Discharge status: Home / Self Care | Payer: Medicare Other | Attending: Internal Medicine | Admitting: Internal Medicine

## 2015-01-12 ENCOUNTER — Encounter: Payer: Self-pay | Admitting: Radiology

## 2015-01-12 ENCOUNTER — Inpatient Hospital Stay: Payer: Medicare Other

## 2015-01-12 DIAGNOSIS — I340 Nonrheumatic mitral (valve) insufficiency: Secondary | ICD-10-CM

## 2015-01-12 LAB — PROTIME-INR
INR: 1.48
Prothrombin Time: 18.1 seconds — ABNORMAL HIGH (ref 11.4–15.0)

## 2015-01-12 LAB — BASIC METABOLIC PANEL
Anion gap: 8 (ref 5–15)
BUN: 10 mg/dL (ref 6–20)
CHLORIDE: 106 mmol/L (ref 101–111)
CO2: 24 mmol/L (ref 22–32)
Calcium: 8.1 mg/dL — ABNORMAL LOW (ref 8.9–10.3)
Creatinine, Ser: 0.8 mg/dL (ref 0.44–1.00)
GFR calc Af Amer: >60 mL/min (ref >60)
GLUCOSE: 98 mg/dL (ref 65–99)
POTASSIUM: 4 mmol/L (ref 3.5–5.1)
Sodium: 138 mmol/L (ref 135–145)

## 2015-01-12 LAB — CBC
HCT: 33.1 % — ABNORMAL LOW (ref 35.0–47.0)
Hemoglobin: 10.7 g/dL — ABNORMAL LOW (ref 12.0–16.0)
MCH: 27.9 pg (ref 26.0–34.0)
MCHC: 32.2 g/dL (ref 32.0–36.0)
MCV: 86.5 fL (ref 80.0–100.0)
PLATELETS: 196 10*3/uL (ref 150–440)
RBC: 3.83 MIL/uL (ref 3.80–5.20)
RDW: 14.6 % — ABNORMAL HIGH (ref 11.5–14.5)
WBC: 14.1 10*3/uL — ABNORMAL HIGH (ref 3.6–11.0)

## 2015-01-12 LAB — GLUCOSE, CAPILLARY
Glucose-Capillary: 99 mg/dL (ref 65–99)
Glucose-Capillary: 144 mg/dL — ABNORMAL HIGH (ref 65–99)
Glucose-Capillary: 92 mg/dL (ref 65–99)
Glucose-Capillary: 115 mg/dL — ABNORMAL HIGH (ref 65–99)

## 2015-01-12 LAB — SEDIMENTATION RATE: Sed Rate: 92 mm/hr — ABNORMAL HIGH (ref 0–30)

## 2015-01-12 LAB — VITAMIN B12: VITAMIN B 12: 250 pg/mL (ref 180–914)

## 2015-01-12 LAB — FOLATE: Folate: 9.5 ng/mL (ref >5.9)

## 2015-01-12 MED ORDER — SODIUM CHLORIDE 0.9 % IV SOLN
INTRAVENOUS | Status: DC
  Administered 2015-01-12: 09:00:00 via INTRAVENOUS

## 2015-01-12 MED ORDER — PIPERACILLIN-TAZOBACTAM 3.375 G IVPB
3.3750 g | Freq: Three times a day (TID) | INTRAVENOUS | Status: DC
  Administered 2015-01-12 – 2015-01-14 (×6): 3.375 g via INTRAVENOUS
  Filled 2015-01-12 (×8): qty 50

## 2015-01-12 MED ORDER — IOHEXOL 350 MG/ML SOLN
75.0000 mL | Freq: Once | INTRAVENOUS | Status: AC | PRN
  Administered 2015-01-12: 75 mL via INTRAVENOUS

## 2015-01-12 MED ORDER — VANCOMYCIN HCL 10 G IV SOLR
1250.0000 mg | INTRAVENOUS | Status: DC
  Administered 2015-01-12 – 2015-01-14 (×3): 1250 mg via INTRAVENOUS
  Filled 2015-01-12 (×3): qty 1250

## 2015-01-12 NOTE — Progress Notes (Signed)
Per Dr Nemiah Commander patient can go down to CT without nursing

## 2015-01-12 NOTE — Consult Note (Signed)
Pt VSS not on pressors.  No abd pain, no distention, no bleeding, no diarrhea. Pt knows her name and knows she is in hospital and in [place].  She ate breakfast and is hungry for something else.  I doubt her probable sepsis came from the rectum in absence of bleeding, diarrhea, abd pain, tenderness.  She has diverticulosis, possible to have diverticulitis and due to dementia not perceive it but this is doubtful.  No indication for endoscopic procedure.  I will sign off, reconsult if any GI problems.

## 2015-01-12 NOTE — Progress Notes (Signed)
[facility] - [place] [facility]   PATIENT NAME: [name] Cunningham    MR#:  [id number]  DATE OF BIRTH:  1929-03-27  SUBJECTIVE:  CHIEF COMPLAINT:   Chief Complaint  Patient presents with  . Weakness   Admitted for altered mental status and possible sepsis with hypotension. -Was somnolent all day yesterday but intermittent periods of wakefulness. -Patient very alert this morning, oriented to place and person. Has some baseline dementia. Was able to recover lead to most of the details. -Blood pressure is stable, pressors not started. Was on Ventimask this a.m., periods of hypoxia.  REVIEW OF SYSTEMS:  Review of Systems  Constitutional: Positive for chills. Negative for fever.  HENT: Negative for ear discharge and ear pain.   Eyes: Negative for blurred vision and double vision.  Respiratory: Negative for cough, shortness of breath and wheezing.   Cardiovascular: Negative for chest pain, palpitations and leg swelling.  Gastrointestinal: Negative for nausea, vomiting, abdominal pain, diarrhea and constipation.  Genitourinary: Negative for dysuria.  Neurological: Positive for sensory change and weakness. Negative for dizziness, focal weakness, seizures and headaches.  Psychiatric/Behavioral: Positive for memory loss. Negative for depression. The patient is not nervous/anxious.     DRUG ALLERGIES:   Allergies  Allergen Reactions  . Hydroxyzine Hcl Other (See Comments)    Makes patient feel like she does not want to live    VITALS:  Blood pressure 126/55, pulse 65, temperature 97.9 F (36.6 C), temperature source Axillary, resp. rate 16, height  (1.651 m), weight 71.4 kg (157 lb 6.5 oz), SpO2 88 %.  PHYSICAL EXAMINATION:  Physical Exam  GENERAL:  81 y.o.-year-old patient lying in the bed with no acute distress.  EYES: Pupils equal, round, reactive to light and accommodation. Postsurgical pupils noted. No scleral icterus. Extraocular muscles  intact.  HEENT: Head atraumatic, normocephalic. Oropharynx and nasopharynx clear. Has only a few teeth left on the lower jaw, and gums look healthy. On the upper jaw the last molars on either side up clear with significant cavities, some swelling of the gum noted. NECK:  Supple, no jugular venous distention. No thyroid enlargement, no tenderness.  LUNGS: Normal breath sounds bilaterally, no wheezing, rales,rhonchi or crepitation. No use of accessory muscles of respiration. Decreased bibasilar breath sounds CARDIOVASCULAR: S1, S2 normal. No rubs, or gallops. 3/6 systolic murmur present ABDOMEN: Soft, nontender, nondistended. Bowel sounds present. No organomegaly or mass.  EXTREMITIES: No pedal edema, cyanosis, or clubbing.  NEUROLOGIC: Cranial nerves II through XII are intact. Muscle strength 5/5 in all extremities. Sensation intact. Gait not checked.  PSYCHIATRIC: The patient is alert and oriented x 2.  SKIN: No obvious rash, lesion, or ulcer.    LABORATORY PANEL:   CBC  Recent Labs Lab 01/12/15 0553  WBC 14.1*  HGB 10.7*  HCT 33.1*  PLT 196   ------------------------------------------------------------------------------------------------------------------  Chemistries   Recent Labs Lab 01/11/15 1428 01/11/15 1651 01/12/15 0553  NA  --  136 138  K  --  3.6 4.0  CL  --  101 106  CO2  --  27 24  GLUCOSE  --  99 98  BUN  --  15 10  CREATININE  --  1.02* 0.80  CALCIUM  --  7.7* 8.1*  MG 2.3  --   --   AST  --  25  --   ALT  --  25  --   ALKPHOS  --  50  --   BILITOT  --  0.9  --    ------------------------------------------------------------------------------------------------------------------  Cardiac Enzymes  Recent Labs Lab 01/10/15 2027  TROPONINI <0.03   ------------------------------------------------------------------------------------------------------------------  RADIOLOGY:  Dg Chest 2 View  01/10/2015   CLINICAL DATA:  Acute onset of nausea,  vomiting and weakness. Initial encounter.  EXAM: CHEST  2 VIEW  COMPARISON:  Chest radiograph performed 06/20/2012  FINDINGS: The lungs are well-aerated. Minimal left basilar opacity likely reflects atelectasis. Pulmonary vascularity is at the upper limits of normal. There is no evidence of pleural effusion or pneumothorax.  The heart is normal in size; the mediastinal contour is within normal limits. No acute osseous abnormalities are seen.  IMPRESSION: Minimal left basilar opacity likely reflects atelectasis. Lungs otherwise grossly clear.   Electronically Signed   By: Roanna Raider M.D.   On: 01/10/2015 21:26   Ct Head Wo Contrast  01/10/2015   CLINICAL DATA:  weakness, n/v x 2 days. no vomiting for past few hours. Pt hx dementia, afib, CHF, osteoporosis, and sleep apnea. VS stable en route except spo2 88% on RA, 94% on 2L.  EXAM: CT HEAD WITHOUT CONTRAST  TECHNIQUE: Contiguous axial images were obtained from the base of the skull through the vertex without intravenous contrast.  COMPARISON:  05/31/2010  FINDINGS: Atherosclerotic and physiologic intracranial calcifications. Diffuse parenchymal atrophy. Patchy areas of hypoattenuation in deep and periventricular white matter bilaterally. Negative for acute intracranial hemorrhage, mass lesion, acute infarction, midline shift, or mass-effect. Acute infarct may be inapparent on noncontrast CT. Ventricles and sulci symmetric. Bone windows demonstrate no focal lesion.  IMPRESSION: 1. Negative for bleed or other acute intracranial process. 2. Atrophy and nonspecific white matter changes.   Electronically Signed   By: Corlis Leak M.D.   On: 01/10/2015 20:52   Ct Abdomen Pelvis W Contrast  01/10/2015   CLINICAL DATA:  Weakness, nausea and vomiting for 2 days.  EXAM: CT ABDOMEN AND PELVIS WITH CONTRAST  TECHNIQUE: Multidetector CT imaging of the abdomen and pelvis was performed using the standard protocol following bolus administration of intravenous contrast.   CONTRAST:  75mL OMNIPAQUE IOHEXOL 300 MG/ML  SOLN  COMPARISON:  None.  FINDINGS: Lower chest: Sub cm nodule in the posterior left lower lobe, probably not significantly changed from 03/30/2009. No acute findings in the lower chest.  Hepatobiliary: Prior cholecystectomy. Liver and bile ducts are unremarkable.  Pancreas: Normal  Spleen: Normal  Adrenals/Urinary Tract: The adrenals and kidneys are normal in appearance. There is no urinary calculus evident. There is no hydronephrosis or ureteral dilatation. Collecting systems and ureters appear unremarkable.  Stomach/Bowel: Small hiatal hernia. Small bowel is normal. Mild colonic diverticulosis. Moderate mural enhancement of the rectum and rectosigmoid. This is nonspecific but could represent proctitis or colitis.  Vascular/Lymphatic: The abdominal aorta is normal in caliber, heavily calcified.  Reproductive: Hysterectomy.  No adnexal abnormalities.  Other: No ascites.  No adenopathy.  Musculoskeletal: No significant musculoskeletal lesions. Moderately severe degenerative disc and facet changes are present.  IMPRESSION: 1. Moderate mural enhancement of the rectum and rectosigmoid, raising the question of proctitis or colitis. 2. Small hiatal hernia 3. Diverticulosis   Electronically Signed   By: Ellery Plunk M.D.   On: 01/10/2015 22:39    EKG:   Orders placed or performed during the hospital encounter of 01/10/15  . ED EKG  . ED EKG  . EKG 12-Lead  . EKG 12-Lead    ASSESSMENT AND PLAN:   81 year old female with past medical history significant for atrial fibrillation on Coumadin, congestive heart  failure, coronary artery disease, sleep apnea, COPD and diverticulosis, dementia admitted to the hospital secondary to weakness and somnolence.  #1 altered mental status-likely metabolic encephalopathy, compounded by her mild dementia. -CT of the head on admission with no acute findings. Cerebral atrophy noted. -Patient is alert and oriented, close to  baseline at this time. But according to RN, she did have intermittent episodes of somnolence yesterday. So we'll continue to monitor. -Hold her Remeron at this time  #2 sepsis-likely secondary to colitis. No other source of infection noted at this time. -Couple of bad teeth, but no significant abscess noted. -Follow blood cultures. Antibiotics have been changed to vancomycin and Zosyn. -No significant diarrhea in the hospital, only one episode of loose stool noted. C.diff has not been sent. -Appreciate GI consult. -Fluid resuscitation helped, no pressors started yet  #3 hypoxia-known history of COPD, no exacerbation noted at this time. -Chest x-ray with only atelectasis. Currently requiring 3 L nasal cannula. -Discontinue Ventimask. Follow-up  CT chest today.  #4 atrial fibrillation-rate is controlled at this time. -Not on any beta blockers or calcium channel blockers likely from hypertension. -Continue warfarin. INR is subtherapeutic. Pharmacy is adjusting the dose.  #5 congestive heart failure-unknown ejection fraction. Takes Lasix at home. -Currently does not appear to be fluid overloaded. Follow-up and echocardiogram. -Due to hypotension, hold Lasix at this time.  #6 DVT prophylaxis-on warfarin  All the records are reviewed and case discussed with Care Management/Social Workerr. Management plans discussed with the patient, family and they are in agreement.  CODE STATUS: Full code  TOTAL CRITICAL CARE TIME SPENT IN TAKING CARE OF THIS PATIENT: 38 minutes.   POSSIBLE D/C IN 2 DAYS, DEPENDING ON CLINICAL CONDITION.   Deriona Altemose M.D on 01/12/2015 at 7:56 AM  Between 7am to 6pm - Pager - [phone]  After 6pm go to [email or web address] - password [email or web address] [facility]  [facility]  Office  [phone]  CC: Primary care physician; No primary care provider on file.

## 2015-01-12 NOTE — Progress Notes (Signed)
ANTICOAGULATION CONSULT NOTE - Initial Consult  Pharmacy Consult for warfarin Indication: atrial fibrillation  Allergies  Allergen Reactions  . Hydroxyzine Hcl Other (See Comments)    Makes patient feel like she does not want to live    Patient Measurements: Height:  (165.1 cm) Weight: 157 lb 6.5 oz (71.4 kg) IBW/kg (Calculated) : 57  Vital Signs: BP: 126/55 mmHg (10/08 0400) Pulse Rate: 65 (10/08 0400)  Labs:  Recent Labs  01/10/15 2027 01/11/15 0139 01/11/15 1651 01/12/15 0553  HGB 12.8  --   --  10.7*  HCT 37.9  --   --  33.1*  PLT 263  --   --  196  LABPROT  --  18.3*  --  18.1*  INR  --  1.50  --  1.48  CREATININE 1.01*  --  1.02* 0.80  TROPONINI <0.03  --   --   --     Estimated Creatinine Clearance: 51.9 mL/min (by C-G formula based on Cr of 0.8).   Medical History: Past Medical History  Diagnosis Date  . HYPERLIPIDEMIA 01/06/2007  . HYPERTENSION 01/06/2007  . COPD 01/06/2007  . ANXIETY 11/21/2008  . SLEEP APNEA 11/21/2008  . DIVERTICULITIS OF COLON 07/23/2009  . Fistula   . GERD 01/06/2007  . PAD (peripheral artery disease) (HCC)   . CAD (coronary artery disease)     Mild by cath in remote past  . Atrial fibrillation (HCC)   . CHF (congestive heart failure) (HCC)     Medications:  Scheduled:  . acetaminophen  1,000 mg Oral TID  . fluticasone  1 spray Each Nare Daily  . gabapentin  100 mg Oral TID  . insulin aspart  0-9 Units Subcutaneous TID WC  . mirtazapine  15 mg Oral QHS  . nystatin cream  1 application Topical BID  . ondansetron  4 mg Oral Q6H  . pantoprazole  40 mg Oral Daily  . piperacillin-tazobactam (ZOSYN)  IV  4.5 g Intravenous 3 times per day  . sodium chloride  500 mL Intravenous Once  . sodium chloride  3 mL Intravenous Q12H  . vancomycin  750 mg Intravenous Q24H  . Vitamin D (Ergocalciferol)  50,000 Units Oral Q30 days  . [START ON 01/13/2015] warfarin  1 mg Oral Once per day on Sun Tue Thu  . warfarin  1.5 mg Oral Once per  day on Mon Wed Fri Sat    Assessment: Pharmacy consulted to dose and monitor warfarin in an 81 yo female admitted to ICU with colitis.  Patient currently ordered Cipro 400 mg IV q12h and Metronidazole 500 mg IV q8h.  Per med rec, patient takes warfarin 1 mg on Tues, Thurs, Sun and 1.5 mg on all other days.   10/7- INR: 1.5 Coumadin 1.5 mg 10/8- INR: 1.48  Goal of Therapy:  INR 2-3 Monitor CBC per policy   Plan:  Continue outpatient dosing as patient's INR on 9/12 was 2.1. Patient is also receiving IV antibiotic therapy with Metronidazole and Ciprofloxacin.  These agents may increase the INR.  Will check INR in AM to help guide dosing.   Pharmacy will continue to follow.  Luisa Hart D 01/12/2015,7:28 AM

## 2015-01-12 NOTE — Progress Notes (Signed)
*  PRELIMINARY RESULTS* Echocardiogram 2D Echocardiogram has been performed.  [name] Cunningham 01/12/2015, 9:43 AM

## 2015-01-12 NOTE — Progress Notes (Signed)
Spoke with Dr Katrinka Blazing family would like to know when he was co ing in. He states that he may not be able to due to the weather

## 2015-01-12 NOTE — Consult Note (Signed)
Reason for Consult: stroke Referring Physician: Dr. [name] is an 81 y.o. female.  HPI: seen at request of Dr. Tressia Cunningham for stroke;  81 yo RHD F presents to [facility] due to abdominal pain and increased lethargy.  Since admission, pt has been having multiple episodes of unresponsiveness and lethargy that waxes and wanes.  Currently pt is doing well per family.  Pt has hx of dementia which is denied by patient even with family in the room.  There is no prolonged confusion per nursing.  No tongue biting or incontinence noted;  Pt never had this before.  Past Medical History  Diagnosis Date  . HYPERLIPIDEMIA 01/06/2007  . HYPERTENSION 01/06/2007  . COPD 01/06/2007  . ANXIETY 11/21/2008  . SLEEP APNEA 11/21/2008  . DIVERTICULITIS OF COLON 07/23/2009  . Fistula   . GERD 01/06/2007  . PAD (peripheral artery disease) ([name])   . CAD (coronary artery disease)     Mild by cath in remote past  . Atrial fibrillation ([place])   . CHF (congestive heart failure) [facility])     Past Surgical History  Procedure Laterality Date  . Temporal artery biopsy / ligation    . Cataract extraction    . Cholecystectomy    . Abdominal hysterectomy    . Appendectomy    . Partial colectomy      diverticulitis  . Colovaginal fistula repair      after partial colectomy  . Tubal ligation    . Gallbladder surgery    . Tonsillectomy  81 YEARS OLD    Family History  Problem Relation Age of Onset  . Heart disease Father   . Asthma Father   . Diabetes Daughter   . Colon cancer Son   . Cancer Mother     Social History:  reports that she has been smoking Cigarettes.  She has been smoking about 1.50 packs per day. She does not have any smokeless tobacco history on file. She reports that she does not drink alcohol or use illicit drugs.  Allergies:  Allergies  Allergen Reactions  . Hydroxyzine Hcl Other (See Comments)    Makes patient feel like she does not want to live    Medications: personally  reviewed by me as per chart  Results for orders placed or performed during the hospital encounter of 01/10/15 (from the past 48 hour(s))  CBC     Status: Abnormal   Collection Time: 01/10/15  8:27 PM  Result Value Ref Range   WBC 21.4 (H) 3.6 - 11.0 K/uL   RBC 4.47 3.80 - 5.20 MIL/uL   Hemoglobin 12.8 12.0 - 16.0 g/dL   HCT 37.9 35.0 - 47.0 %   MCV 84.8 80.0 - 100.0 fL   MCH 28.6 26.0 - 34.0 pg   MCHC 33.8 32.0 - 36.0 g/dL   RDW 14.0 11.5 - 14.5 %   Platelets 263 150 - 440 K/uL  Urinalysis complete, with microscopic ([facility] only)     Status: Abnormal   Collection Time: 01/10/15  8:27 PM  Result Value Ref Range   Color, Urine YELLOW (A) YELLOW   APPearance CLEAR (A) CLEAR   Glucose, UA NEGATIVE NEGATIVE mg/dL   Bilirubin Urine NEGATIVE NEGATIVE   Ketones, ur NEGATIVE NEGATIVE mg/dL   Specific Gravity, Urine 1.013 1.005 - 1.030   Hgb urine dipstick NEGATIVE NEGATIVE   pH 5.0 5.0 - 8.0   Protein, ur NEGATIVE NEGATIVE mg/dL   Nitrite NEGATIVE NEGATIVE   Leukocytes, UA  NEGATIVE NEGATIVE   RBC / HPF 0-5 0 - 5 RBC/hpf   WBC, UA 0-5 0 - 5 WBC/hpf   Bacteria, UA NONE SEEN NONE SEEN   Squamous Epithelial / LPF 0-5 (A) NONE SEEN   Mucous PRESENT    Hyaline Casts, UA PRESENT   Troponin I     Status: None   Collection Time: 01/10/15  8:27 PM  Result Value Ref Range   Troponin I <0.03 <0.031 ng/mL    Comment:        NO INDICATION OF MYOCARDIAL INJURY.   Comprehensive metabolic panel     Status: Abnormal   Collection Time: 01/10/15  8:27 PM  Result Value Ref Range   Sodium 134 (L) 135 - 145 mmol/L   Potassium 2.9 (LL) 3.5 - 5.1 mmol/L    Comment: CRITICAL RESULT CALLED TO, READ BACK BY AND VERIFIED WITH [name] Cunningham @2122  01/10/15.Marland KitchenMarland KitchenAJO    Chloride 93 (L) 101 - 111 mmol/L   CO2 29 22 - 32 mmol/L   Glucose, Bld 148 (H) 65 - 99 mg/dL   BUN 17 6 - 20 mg/dL   Creatinine, Ser 1.01 (H) 0.44 - 1.00 mg/dL   Calcium 8.2 (L) 8.9 - 10.3 mg/dL   Total Protein 7.3 6.5 - 8.1 g/dL   Albumin  3.6 3.5 - 5.0 g/dL   AST 49 (H) 15 - 41 U/L   ALT 36 14 - 54 U/L   Alkaline Phosphatase 52 38 - 126 U/L   Total Bilirubin 1.0 0.3 - 1.2 mg/dL   GFR calc non Af Amer 50 (L) >60 mL/min   GFR calc Af Amer 58 (L) >60 mL/min    Comment: (NOTE) The eGFR has been calculated using the CKD EPI equation. This calculation has not been validated in all clinical situations. eGFR's persistently <60 mL/min signify possible Chronic Kidney Disease.    Anion gap 12 5 - 15  Blood gas, venous     Status: Abnormal   Collection Time: 01/10/15  8:27 PM  Result Value Ref Range   pH, Ven 7.40 7.320 - 7.430   pCO2, Ven 52 44.0 - 60.0 mmHg   Bicarbonate 32.2 (H) 21.0 - 28.0 mEq/L   Acid-Base Excess 6.0 (H) 0.0 - 3.0 mmol/L   Patient temperature 37.0    Collection site RESISTANT    Sample type VENOUS   Hemoglobin A1c     Status: Abnormal   Collection Time: 01/11/15  1:39 AM  Result Value Ref Range   Hgb A1c MFr Bld 6.2 (H) 4.0 - 6.0 %  Protime-INR     Status: Abnormal   Collection Time: 01/11/15  1:39 AM  Result Value Ref Range   Prothrombin Time 18.3 (H) 11.4 - 15.0 seconds   INR 1.50   Magnesium     Status: Abnormal   Collection Time: 01/11/15  1:39 AM  Result Value Ref Range   Magnesium 0.8 (LL) 1.7 - 2.4 mg/dL    Comment: CRITICAL RESULT CALLED TO, READ BACK BY AND VERIFIED WITH [name] Cunningham AT 0806 ON 01/11/15 BY [name] Cunningham   Potassium     Status: None   Collection Time: 01/11/15  8:27 AM  Result Value Ref Range   Potassium 3.6 3.5 - 5.1 mmol/L  Blood gas, arterial     Status: Abnormal   Collection Time: 01/11/15 10:15 AM  Result Value Ref Range   FIO2 0.24    Delivery systems NO CHARGE    pH, Arterial 7.47 (H) 7.350 - 7.450  pCO2 arterial 35 32.0 - 48.0 mmHg   pO2, Arterial 83 83.0 - 108.0 mmHg   Bicarbonate 25.5 21.0 - 28.0 mEq/L   Acid-Base Excess 2.1 0.0 - 3.0 mmol/L   O2 Saturation 96.8 %   Patient temperature 37.0    Collection site RIGHT RADIAL    Sample type ARTERIAL DRAW    Allens  test (pass/fail) POSITIVE (A) PASS  Lactic acid, plasma     Status: None   Collection Time: 01/11/15 11:16 AM  Result Value Ref Range   Lactic Acid, Venous 1.7 0.5 - 2.0 mmol/L  Lactic acid, plasma     Status: None   Collection Time: 01/11/15  2:28 PM  Result Value Ref Range   Lactic Acid, Venous 1.0 0.5 - 2.0 mmol/L  Magnesium     Status: None   Collection Time: 01/11/15  2:28 PM  Result Value Ref Range   Magnesium 2.3 1.7 - 2.4 mg/dL  TSH     Status: None   Collection Time: 01/11/15  2:28 PM  Result Value Ref Range   TSH 0.587 0.350 - 4.500 uIU/mL  Culture, blood (routine x 2)     Status: None (Preliminary result)   Collection Time: 01/11/15  4:51 PM  Result Value Ref Range   Specimen Description BLOOD LEFT AC    Special Requests BOTTLES DRAWN AEROBIC AND ANAEROBIC  10CC    Culture NO GROWTH < 24 HOURS    Report Status PENDING   Ammonia     Status: None   Collection Time: 01/11/15  4:51 PM  Result Value Ref Range   Ammonia 14 9 - 35 umol/L  Comprehensive metabolic panel     Status: Abnormal   Collection Time: 01/11/15  4:51 PM  Result Value Ref Range   Sodium 136 135 - 145 mmol/L   Potassium 3.6 3.5 - 5.1 mmol/L   Chloride 101 101 - 111 mmol/L   CO2 27 22 - 32 mmol/L   Glucose, Bld 99 65 - 99 mg/dL   BUN 15 6 - 20 mg/dL   Creatinine, Ser 1.02 (H) 0.44 - 1.00 mg/dL   Calcium 7.7 (L) 8.9 - 10.3 mg/dL   Total Protein 6.7 6.5 - 8.1 g/dL   Albumin 3.2 (L) 3.5 - 5.0 g/dL   AST 25 15 - 41 U/L   ALT 25 14 - 54 U/L   Alkaline Phosphatase 50 38 - 126 U/L   Total Bilirubin 0.9 0.3 - 1.2 mg/dL   GFR calc non Af Amer 49 (L) >60 mL/min   GFR calc Af Amer 57 (L) >60 mL/min    Comment: (NOTE) The eGFR has been calculated using the CKD EPI equation. This calculation has not been validated in all clinical situations. eGFR's persistently <60 mL/min signify possible Chronic Kidney Disease.    Anion gap 8 5 - 15  Culture, blood (routine x 2)     Status: None (Preliminary result)    Collection Time: 01/11/15  5:04 PM  Result Value Ref Range   Specimen Description BLOOD RIGHT AC    Special Requests      BOTTLES DRAWN AEROBIC AND ANAEROBIC  AER 10CC ANA 6CC   Culture NO GROWTH < 24 HOURS    Report Status PENDING   Glucose, capillary     Status: None   Collection Time: 01/11/15  6:03 PM  Result Value Ref Range   Glucose-Capillary 90 65 - 99 mg/dL  Glucose, capillary     Status: Abnormal  Collection Time: 01/11/15 10:12 PM  Result Value Ref Range   Glucose-Capillary 114 (H) 65 - 99 mg/dL  Protime-INR     Status: Abnormal   Collection Time: 01/12/15  5:53 AM  Result Value Ref Range   Prothrombin Time 18.1 (H) 11.4 - 15.0 seconds   INR 1.48   CBC     Status: Abnormal   Collection Time: 01/12/15  5:53 AM  Result Value Ref Range   WBC 14.1 (H) 3.6 - 11.0 K/uL   RBC 3.83 3.80 - 5.20 MIL/uL   Hemoglobin 10.7 (L) 12.0 - 16.0 g/dL   HCT 33.1 (L) 35.0 - 47.0 %   MCV 86.5 80.0 - 100.0 fL   MCH 27.9 26.0 - 34.0 pg   MCHC 32.2 32.0 - 36.0 g/dL   RDW 14.6 (H) 11.5 - 14.5 %   Platelets 196 150 - 440 K/uL  Basic metabolic panel     Status: Abnormal   Collection Time: 01/12/15  5:53 AM  Result Value Ref Range   Sodium 138 135 - 145 mmol/L   Potassium 4.0 3.5 - 5.1 mmol/L   Chloride 106 101 - 111 mmol/L   CO2 24 22 - 32 mmol/L   Glucose, Bld 98 65 - 99 mg/dL   BUN 10 6 - 20 mg/dL   Creatinine, Ser 0.80 0.44 - 1.00 mg/dL   Calcium 8.1 (L) 8.9 - 10.3 mg/dL   GFR calc non Af Amer >60 >60 mL/min   GFR calc Af Amer >60 >60 mL/min    Comment: (NOTE) The eGFR has been calculated using the CKD EPI equation. This calculation has not been validated in all clinical situations. eGFR's persistently <60 mL/min signify possible Chronic Kidney Disease.    Anion gap 8 5 - 15  Glucose, capillary     Status: None   Collection Time: 01/12/15  7:09 AM  Result Value Ref Range   Glucose-Capillary 99 65 - 99 mg/dL  Glucose, capillary     Status: Abnormal   Collection Time: 01/12/15  11:43 AM  Result Value Ref Range   Glucose-Capillary 144 (H) 65 - 99 mg/dL  Glucose, capillary     Status: None   Collection Time: 01/12/15  3:49 PM  Result Value Ref Range   Glucose-Capillary 92 65 - 99 mg/dL    Dg Chest 2 View  01/10/2015   CLINICAL DATA:  Acute onset of nausea, vomiting and weakness. Initial encounter.  EXAM: CHEST  2 VIEW  COMPARISON:  Chest radiograph performed 06/20/2012  FINDINGS: The lungs are well-aerated. Minimal left basilar opacity likely reflects atelectasis. Pulmonary vascularity is at the upper limits of normal. There is no evidence of pleural effusion or pneumothorax.  The heart is normal in size; the mediastinal contour is within normal limits. No acute osseous abnormalities are seen.  IMPRESSION: Minimal left basilar opacity likely reflects atelectasis. Lungs otherwise grossly clear.   Electronically Signed   By: Garald Balding M.D.   On: 01/10/2015 21:26   Ct Head Wo Contrast  01/10/2015   CLINICAL DATA:  weakness, n/v x 2 days. no vomiting for past few hours. Pt hx dementia, afib, CHF, osteoporosis, and sleep apnea. VS stable en route except spo2 88% on RA, 94% on 2L.  EXAM: CT HEAD WITHOUT CONTRAST  TECHNIQUE: Contiguous axial images were obtained from the base of the skull through the vertex without intravenous contrast.  COMPARISON:  05/31/2010  FINDINGS: Atherosclerotic and physiologic intracranial calcifications. Diffuse parenchymal atrophy. Patchy areas of hypoattenuation in  deep and periventricular white matter bilaterally. Negative for acute intracranial hemorrhage, mass lesion, acute infarction, midline shift, or mass-effect. Acute infarct may be inapparent on noncontrast CT. Ventricles and sulci symmetric. Bone windows demonstrate no focal lesion.  IMPRESSION: 1. Negative for bleed or other acute intracranial process. 2. Atrophy and nonspecific white matter changes.   Electronically Signed   By: Lucrezia Europe M.D.   On: 01/10/2015 20:52   Ct Angio Chest Pe  W/cm &/or Wo Cm  01/12/2015   CLINICAL DATA:  Hypoxia.  EXAM: CT ANGIOGRAPHY CHEST WITH CONTRAST  TECHNIQUE: Multidetector CT imaging of the chest was performed using the standard protocol during bolus administration of intravenous contrast. Multiplanar CT image reconstructions and MIPs were obtained to evaluate the vascular anatomy.  CONTRAST:  52m OMNIPAQUE IOHEXOL 350 MG/ML SOLN  COMPARISON:  CT scan of April 01, 2009.  FINDINGS: No pneumothorax is noted. Minimal bilateral pleural effusions are noted with adjacent subsegmental atelectasis. Focal airspace opacity is noted in the left lower lobe which may represent pneumonia. There is no evidence of pulmonary embolus. Atherosclerosis of thoracic aorta is noted without aneurysm or dissection. Visualized portion of upper abdomen is unremarkable. Coronary artery calcifications are noted. No mediastinal mass or adenopathy is noted. No significant osseous abnormality is noted.  Review of the MIP images confirms the above findings.  IMPRESSION: No evidence of pulmonary embolus is noted.  Coronary artery calcifications are noted suggesting coronary artery disease.  Atherosclerosis of thoracic aorta is noted without aneurysm or dissection.  Minimal bilateral pleural effusions are noted with adjacent subsegmental atelectasis. Focal airspace opacity is noted in the left lower lobe which may represent pneumonia.   Electronically Signed   By: JMarijo Conception M.D.   On: 01/12/2015 14:53   Ct Abdomen Pelvis W Contrast  01/10/2015   CLINICAL DATA:  Weakness, nausea and vomiting for 2 days.  EXAM: CT ABDOMEN AND PELVIS WITH CONTRAST  TECHNIQUE: Multidetector CT imaging of the abdomen and pelvis was performed using the standard protocol following bolus administration of intravenous contrast.  CONTRAST:  753mOMNIPAQUE IOHEXOL 300 MG/ML  SOLN  COMPARISON:  None.  FINDINGS: Lower chest: Sub cm nodule in the posterior left lower lobe, probably not significantly changed from  03/30/2009. No acute findings in the lower chest.  Hepatobiliary: Prior cholecystectomy. Liver and bile ducts are unremarkable.  Pancreas: Normal  Spleen: Normal  Adrenals/Urinary Tract: The adrenals and kidneys are normal in appearance. There is no urinary calculus evident. There is no hydronephrosis or ureteral dilatation. Collecting systems and ureters appear unremarkable.  Stomach/Bowel: Small hiatal hernia. Small bowel is normal. Mild colonic diverticulosis. Moderate mural enhancement of the rectum and rectosigmoid. This is nonspecific but could represent proctitis or colitis.  Vascular/Lymphatic: The abdominal aorta is normal in caliber, heavily calcified.  Reproductive: Hysterectomy.  No adnexal abnormalities.  Other: No ascites.  No adenopathy.  Musculoskeletal: No significant musculoskeletal lesions. Moderately severe degenerative disc and facet changes are present.  IMPRESSION: 1. Moderate mural enhancement of the rectum and rectosigmoid, raising the question of proctitis or colitis. 2. Small hiatal hernia 3. Diverticulosis   Electronically Signed   By: DaAndreas Newport.D.   On: 01/10/2015 22:39    Review of Systems  Constitutional: Negative.   HENT: Negative.   Eyes: Negative.   Respiratory: Negative.   Cardiovascular: Negative.   Gastrointestinal: Negative.   Genitourinary: Negative.   Musculoskeletal: Negative.   Skin: Negative.   Neurological: Negative.   Psychiatric/Behavioral: Negative.  Blood pressure 136/62, pulse 65, temperature 97.9 F (36.6 C), temperature source Axillary, resp. rate 18, height 5' 5"  (1.651 m), weight 71.4 kg (157 lb 6.5 oz), SpO2 98 %. Physical Exam  Constitutional: She is oriented to person, place, and time. She appears well-developed and well-nourished. No distress.  HENT:  Head: Normocephalic and atraumatic.  Right Ear: External ear normal.  Left Ear: External ear normal.  Nose: Nose normal.  Mouth/Throat: Oropharynx is clear and moist.  Eyes:  Conjunctivae and EOM are normal. Pupils are equal, round, and reactive to light. Right eye exhibits no discharge.  Neck: Normal range of motion. Neck supple.  Cardiovascular: Normal rate, regular rhythm, normal heart sounds and intact distal pulses.   No murmur heard. Respiratory: Effort normal and breath sounds normal. No respiratory distress.  GI: Soft. Bowel sounds are normal. She exhibits distension. There is tenderness.  Musculoskeletal: Normal range of motion. She exhibits no edema.  Neurological: She is alert and oriented to person, place, and time. She has normal reflexes. She displays normal reflexes. No cranial nerve deficit. She exhibits normal muscle tone. Coordination normal.  Skin: Skin is warm and dry. She is not diaphoretic.  Psychiatric: She has a normal mood and affect.   CT of head shows moderate atrophy and severe white matter changes  Assessment/Plan: 1.  Encephalopathy-  This seems to fit picture as pt is waxing and waning.  This is probably due to infection and some underlying electrolyte abnormalities.  Can not r/o seizure either.  No evidence of stroke -  Check ESR, CRP, RPR, B12/folate -  Continue Zosyn and vanc -  Keep Mg > 2, Ca > 8 and Na > 130 -  Will follow briefly, if no improvement will EEG  [name] Cunningham 01/12/2015, 4:34 PM

## 2015-01-12 NOTE — Progress Notes (Addendum)
ANTIBIOTIC CONSULT NOTE - INITIAL  Pharmacy Consult for Vancomycin/Zosyn  Indication: sepsis  Allergies  Allergen Reactions  . Hydroxyzine Hcl Other (See Comments)    Makes patient feel like she does not want to live    Patient Measurements: Height:  (165.1 cm) Weight: 157 lb 6.5 oz (71.4 kg) IBW/kg (Calculated) : 57 Adjusted Body Weight:   Vital Signs: BP: 126/55 mmHg (10/08 0400) Pulse Rate: 65 (10/08 0400) Intake/Output from previous day: 10/07 0701 - 10/08 0700 In: 1031.3 [P.O.:120; I.V.:511.3] Out: 1400 [Urine:1400] Intake/Output from this shift:    Labs:  Recent Labs  01/10/15 2027 01/11/15 1651 01/12/15 0553  WBC 21.4*  --  14.1*  HGB 12.8  --  10.7*  PLT 263  --  196  CREATININE 1.01* 1.02* 0.80   Estimated Creatinine Clearance: 51.9 mL/min (by C-G formula based on Cr of 0.8). No results for input(s): VANCOTROUGH, VANCOPEAK, VANCORANDOM, GENTTROUGH, GENTPEAK, GENTRANDOM, TOBRATROUGH, TOBRAPEAK, TOBRARND, AMIKACINPEAK, AMIKACINTROU, AMIKACIN in the last 72 hours.   Microbiology: No results found for this or any previous visit (from the past 720 hour(s)).  Medical History: Past Medical History  Diagnosis Date  . HYPERLIPIDEMIA 01/06/2007  . HYPERTENSION 01/06/2007  . COPD 01/06/2007  . ANXIETY 11/21/2008  . SLEEP APNEA 11/21/2008  . DIVERTICULITIS OF COLON 07/23/2009  . Fistula   . GERD 01/06/2007  . PAD (peripheral artery disease) ([facility])   . CAD (coronary artery disease)     Mild by cath in remote past  . Atrial fibrillation ([facility])   . CHF (congestive heart failure) ([facility])     Medications:  Prescriptions prior to admission  Medication Sig Dispense Refill Last Dose  . acetaminophen (TYLENOL) 500 MG tablet Take 1,000 mg by mouth 3 (three) times daily.    unknown at unknown  . alendronate (FOSAMAX) 70 MG tablet Take 70 mg by mouth once a week. Take with a full glass of water on an empty stomach.   unknown at unknown  . diltiazem (CARDIZEM CD) 360 MG  24 hr capsule Take 1 capsule (360 mg total) by mouth daily. 30 capsule 6 unknown at unknown  . fluticasone (FLONASE) 50 MCG/ACT nasal spray USE 2 SPRAYS IN NOSE DAILY AS DIRECTED 16 g 5 unknown at unknown  . furosemide (LASIX) 40 MG tablet Take 1 tablet (40 mg total) by mouth daily. 30 tablet 6 unknown at unknown  . gabapentin (NEURONTIN) 100 MG capsule Take 100 mg by mouth 3 (three) times daily.   unknown at unknown  . lisinopril (PRINIVIL,ZESTRIL) 10 MG tablet Take 2 tablets (20 mg total) by mouth daily. 30 tablet 11 unknown at unknown  . mirtazapine (REMERON) 15 MG tablet Take 15 mg by mouth at bedtime.   unknown at unknown  . nystatin cream (MYCOSTATIN) Apply 1 application topically 2 (two) times daily.   unknown at unknown  . omeprazole (PRILOSEC) 40 MG capsule Take 40 mg by mouth daily.   unknown at unknown  . ondansetron (ZOFRAN) 4 MG tablet Take 1 tablet (4 mg total) by mouth every 6 (six) hours. As needed for nausea or vomiting 12 tablet 0 01/09/2015 at Unknown time  . polyethylene glycol (MIRALAX / GLYCOLAX) packet Take 17 g by mouth daily as needed for mild constipation.    unknown at unknown  . traMADol (ULTRAM) 50 MG tablet Take 50 mg by mouth every 6 (six) hours as needed for moderate pain.    unknown at unknown  . Vitamin D, Ergocalciferol, (DRISDOL) 50000 UNITS  CAPS Take 50,000 Units by mouth every 30 (thirty) days.   unknown at unknown  . warfarin (COUMADIN) 1 MG tablet Take 1.5-2 mg by mouth daily. Take  by mouth on three days per week and take 1.5mg  by mouth on mondays, wednesdays, fridays, saturdays   unknown at unknown  . aspirin 81 MG tablet Take 1 tablet (81 mg total) by mouth daily. (Patient not taking: Reported on 01/10/2015) 30 tablet 0 Not Taking at Unknown time  . cilostazol (PLETAL) 50 MG tablet TAKE 1 TABLET (50 MG TOTAL) BY MOUTH 2 (TWO) TIMES DAILY. (Patient not taking: Reported on 01/10/2015) 60 tablet 11 Not Taking at Unknown time  . oxyCODONE-acetaminophen  (PERCOCET/ROXICET) 5-325 MG per tablet Take 1-2 tablets by mouth every 6 (six) hours as needed (Moderate to severe pain). (Patient not taking: Reported on 01/10/2015) 17 tablet 0 Not Taking at Unknown time  . VOLTAREN 1 % GEL APPLY TWICE A DAY TO SHOULDER AS NEEDED FOR PAIN (Patient not taking: Reported on 01/10/2015) 100 g 2 Completed Course at Unknown time   Assessment: 81 y/o F with possible colitis, possible sepsis previously on ciprofloxacin and metronidazole with antibiotics broadened to vancomycin and Zosyn.   Ke: 0.048 hr-1 Vd: 44.4  Goal of Therapy:  Vancomycin trough level 15-20 mcg/ml  Plan:  Will continue Zosyn 3.375 g EI q 8 hours.   As SCr has improved, will increase vancomycin dosing to 1250 mg iv q 24 hours and plan on checking a trough with the 4th dose. Will f/u culture results and renal function.    Luisa Hart D 01/12/2015,7:31 AM

## 2015-01-13 LAB — BASIC METABOLIC PANEL
Anion gap: 7 (ref 5–15)
BUN: 6 mg/dL (ref 6–20)
CHLORIDE: 106 mmol/L (ref 101–111)
CO2: 24 mmol/L (ref 22–32)
CREATININE: 0.76 mg/dL (ref 0.44–1.00)
Calcium: 8.3 mg/dL — ABNORMAL LOW (ref 8.9–10.3)
GFR calc non Af Amer: >60 mL/min (ref >60)
Glucose, Bld: 95 mg/dL (ref 65–99)
Potassium: 3.5 mmol/L (ref 3.5–5.1)
Sodium: 137 mmol/L (ref 135–145)

## 2015-01-13 LAB — CBC
HEMATOCRIT: 30.4 % — AB (ref 35.0–47.0)
HEMOGLOBIN: 10 g/dL — AB (ref 12.0–16.0)
MCH: 28.1 pg (ref 26.0–34.0)
MCHC: 32.9 g/dL (ref 32.0–36.0)
MCV: 85.3 fL (ref 80.0–100.0)
Platelets: 182 10*3/uL (ref 150–440)
RBC: 3.57 MIL/uL — ABNORMAL LOW (ref 3.80–5.20)
RDW: 14.2 % (ref 11.5–14.5)
WBC: 9.9 10*3/uL (ref 3.6–11.0)

## 2015-01-13 LAB — GLUCOSE, CAPILLARY
Glucose-Capillary: 94 mg/dL (ref 65–99)
Glucose-Capillary: 165 mg/dL — ABNORMAL HIGH (ref 65–99)
Glucose-Capillary: 103 mg/dL — ABNORMAL HIGH (ref 65–99)
Glucose-Capillary: 105 mg/dL — ABNORMAL HIGH (ref 65–99)

## 2015-01-13 LAB — PROTIME-INR
INR: 1.62
Prothrombin Time: 19.4 seconds — ABNORMAL HIGH (ref 11.4–15.0)

## 2015-01-13 MED ORDER — ACETAMINOPHEN 325 MG PO TABS: 650.0000 mg | ORAL_TABLET | Freq: Four times a day (QID) | ORAL | Status: DC | PRN

## 2015-01-13 MED ORDER — SODIUM CHLORIDE 0.9 % IJ SOLN
3.0000 mL | Freq: Two times a day (BID) | INTRAMUSCULAR | Status: DC
  Administered 2015-01-13 – 2015-01-15 (×5): 3 mL via INTRAVENOUS

## 2015-01-13 MED ORDER — SODIUM CHLORIDE 0.9 % IJ SOLN
3.0000 mL | INTRAMUSCULAR | Status: DC | PRN
  Administered 2015-01-13 (×2): 3 mL via INTRAVENOUS
  Filled 2015-01-13: qty 10

## 2015-01-13 MED ORDER — DILTIAZEM HCL 30 MG PO TABS
30.0000 mg | ORAL_TABLET | Freq: Four times a day (QID) | ORAL | Status: DC
  Administered 2015-01-13 – 2015-01-14 (×5): 30 mg via ORAL
  Filled 2015-01-13 (×5): qty 1

## 2015-01-13 MED ORDER — CYANOCOBALAMIN 1000 MCG/ML IJ SOLN
1000.0000 ug | Freq: Every day | INTRAMUSCULAR | Status: DC
  Administered 2015-01-14 – 2015-01-15 (×2): 1000 ug via INTRAMUSCULAR
  Filled 2015-01-13 (×3): qty 1

## 2015-01-13 NOTE — Progress Notes (Addendum)
ANTIBIOTIC CONSULT NOTE - INITIAL  Pharmacy Consult for Vancomycin/Zosyn  Indication: sepsis  Allergies  Allergen Reactions  . Hydroxyzine Hcl Other (See Comments)    Makes patient feel like she does not want to live    Patient Measurements: Height:  (165.1 cm) Weight: 161 lb 2.5 oz (73.1 kg) IBW/kg (Calculated) : 57 Adjusted Body Weight:   Vital Signs: Temp: 97.7 F (36.5 C) (10/09 0400) Temp Source: Oral (10/09 0400) BP: 136/72 mmHg (10/09 0600) Pulse Rate: 77 (10/09 0600) Intake/Output from previous day: 10/08 0701 - 10/09 0700 In: 1512 [P.O.:712; I.V.:700; IV Piggyback:100] Out: 2700 [Urine:2700] Intake/Output from this shift:    Labs:  Recent Labs  01/10/15 2027 01/11/15 1651 01/12/15 0553 01/13/15 0635  WBC 21.4*  --  14.1* 9.9  HGB 12.8  --  10.7* 10.0*  PLT 263  --  196 182  CREATININE 1.01* 1.02* 0.80 0.76   Estimated Creatinine Clearance: 52.4 mL/min (by C-G formula based on Cr of 0.76). No results for input(s): VANCOTROUGH, VANCOPEAK, VANCORANDOM, GENTTROUGH, GENTPEAK, GENTRANDOM, TOBRATROUGH, TOBRAPEAK, TOBRARND, AMIKACINPEAK, AMIKACINTROU, AMIKACIN in the last 72 hours.   Microbiology: Recent Results (from the past 720 hour(s))  Culture, blood (routine x 2)     Status: None (Preliminary result)   Collection Time: 01/11/15  4:51 PM  Result Value Ref Range Status   Specimen Description BLOOD LEFT AC  Final   Special Requests BOTTLES DRAWN AEROBIC AND ANAEROBIC  10CC  Final   Culture NO GROWTH 2 DAYS  Final   Report Status PENDING  Incomplete  Culture, blood (routine x 2)     Status: None (Preliminary result)   Collection Time: 01/11/15  5:04 PM  Result Value Ref Range Status   Specimen Description BLOOD RIGHT AC  Final   Special Requests   Final    BOTTLES DRAWN AEROBIC AND ANAEROBIC  AER 10CC ANA 6CC   Culture NO GROWTH 2 DAYS  Final   Report Status PENDING  Incomplete    Medical History: Past Medical History  Diagnosis Date  .  HYPERLIPIDEMIA 01/06/2007  . HYPERTENSION 01/06/2007  . COPD 01/06/2007  . ANXIETY 11/21/2008  . SLEEP APNEA 11/21/2008  . DIVERTICULITIS OF COLON 07/23/2009  . Fistula   . GERD 01/06/2007  . PAD (peripheral artery disease) (HCC)   . CAD (coronary artery disease)     Mild by cath in remote past  . Atrial fibrillation (HCC)   . CHF (congestive heart failure) (HCC)     Medications:  Prescriptions prior to admission  Medication Sig Dispense Refill Last Dose  . acetaminophen (TYLENOL) 500 MG tablet Take 1,000 mg by mouth 3 (three) times daily.    unknown at unknown  . alendronate (FOSAMAX) 70 MG tablet Take 70 mg by mouth once a week. Take with a full glass of water on an empty stomach.   unknown at unknown  . diltiazem (CARDIZEM CD) 360 MG 24 hr capsule Take 1 capsule (360 mg total) by mouth daily. 30 capsule 6 unknown at unknown  . fluticasone (FLONASE) 50 MCG/ACT nasal spray USE 2 SPRAYS IN NOSE DAILY AS DIRECTED 16 g 5 unknown at unknown  . furosemide (LASIX) 40 MG tablet Take 1 tablet (40 mg total) by mouth daily. 30 tablet 6 unknown at unknown  . gabapentin (NEURONTIN) 100 MG capsule Take 100 mg by mouth 3 (three) times daily.   unknown at unknown  . lisinopril (PRINIVIL,ZESTRIL) 10 MG tablet Take 2 tablets (20 mg total) by mouth  daily. 30 tablet 11 unknown at unknown  . mirtazapine (REMERON) 15 MG tablet Take 15 mg by mouth at bedtime.   unknown at unknown  . nystatin cream (MYCOSTATIN) Apply 1 application topically 2 (two) times daily.   unknown at unknown  . omeprazole (PRILOSEC) 40 MG capsule Take 40 mg by mouth daily.   unknown at unknown  . ondansetron (ZOFRAN) 4 MG tablet Take 1 tablet (4 mg total) by mouth every 6 (six) hours. As needed for nausea or vomiting 12 tablet 0 01/09/2015 at Unknown time  . polyethylene glycol (MIRALAX / GLYCOLAX) packet Take 17 g by mouth daily as needed for mild constipation.    unknown at unknown  . traMADol (ULTRAM) 50 MG tablet Take 50 mg by mouth every  6 (six) hours as needed for moderate pain.    unknown at unknown  . Vitamin D, Ergocalciferol, (DRISDOL) 50000 UNITS CAPS Take 50,000 Units by mouth every 30 (thirty) days.   unknown at unknown  . warfarin (COUMADIN) 1 MG tablet Take 1.5-2 mg by mouth daily. Take  by mouth on three days per week and take 1.5mg  by mouth on mondays, wednesdays, fridays, saturdays   unknown at unknown  . aspirin 81 MG tablet Take 1 tablet (81 mg total) by mouth daily. (Patient not taking: Reported on 01/10/2015) 30 tablet 0 Not Taking at Unknown time  . cilostazol (PLETAL) 50 MG tablet TAKE 1 TABLET (50 MG TOTAL) BY MOUTH 2 (TWO) TIMES DAILY. (Patient not taking: Reported on 01/10/2015) 60 tablet 11 Not Taking at Unknown time  . oxyCODONE-acetaminophen (PERCOCET/ROXICET) 5-325 MG per tablet Take 1-2 tablets by mouth every 6 (six) hours as needed (Moderate to severe pain). (Patient not taking: Reported on 01/10/2015) 17 tablet 0 Not Taking at Unknown time  . VOLTAREN 1 % GEL APPLY TWICE A DAY TO SHOULDER AS NEEDED FOR PAIN (Patient not taking: Reported on 01/10/2015) 100 g 2 Completed Course at Unknown time   Assessment: 81 y/o F with possible colitis, possible sepsis previously on ciprofloxacin and metronidazole with antibiotics broadened to vancomycin and Zosyn.   Ke: 0.048 hr-1, Vd: 44.4 L  Goal of Therapy:  Vancomycin trough level 15-20 mcg/ml  Plan:  Will continue Zosyn 3.375 g EI q 8 hours.   Will continue vancomycin dosing of 1250 mg iv q 24 hours and plan on checking a trough with the 4th dose. This trough is with the 3rd dose of 1250 mg but should represent steady-state as estimated t1/2 about 15-16 hours based on current kinetics. Will f/u culture results and renal function.    Luisa Hart D 01/13/2015,8:20 AM

## 2015-01-13 NOTE — Plan of Care (Signed)
Problem: Discharge Progression Outcomes Goal: Activity appropriate for discharge plan Outcome: Progressing 1. Return back to previous care provider with family supportive. 2.Denies pain since transfer. 3. VSS. Alert, mostly oriented, forgetful, cooperative/pleasant. 4. No stools since transfer. Will monitor for voiding post foley removal. Skin care barrier placed at bedside with staff instructed to use PRN. Zosyn IV continued. 5. Dgt reports pt did not eat much lunch/ tolerated.

## 2015-01-13 NOTE — Progress Notes (Signed)
ANTICOAGULATION CONSULT NOTE - Initial Consult  Pharmacy Consult for warfarin Indication: atrial fibrillation  Allergies  Allergen Reactions  . Hydroxyzine Hcl Other (See Comments)    Makes patient feel like she does not want to live    Patient Measurements: Height:  (165.1 cm) Weight: 161 lb 2.5 oz (73.1 kg) IBW/kg (Calculated) : 57  Vital Signs: Temp: 97.7 F (36.5 C) (10/09 0400) Temp Source: Oral (10/09 0400) BP: 136/72 mmHg (10/09 0600) Pulse Rate: 77 (10/09 0600)  Labs:  Recent Labs  01/10/15 2027 01/11/15 0139 01/11/15 1651 01/12/15 0553 01/13/15 0635  HGB 12.8  --   --  10.7* 10.0*  HCT 37.9  --   --  33.1* 30.4*  PLT 263  --   --  196 182  LABPROT  --  18.3*  --  18.1* 19.4*  INR  --  1.50  --  1.48 1.62  CREATININE 1.01*  --  1.02* 0.80 0.76  TROPONINI <0.03  --   --   --   --     Estimated Creatinine Clearance: 52.4 mL/min (by C-G formula based on Cr of 0.76).   Medical History: Past Medical History  Diagnosis Date  . HYPERLIPIDEMIA 01/06/2007  . HYPERTENSION 01/06/2007  . COPD 01/06/2007  . ANXIETY 11/21/2008  . SLEEP APNEA 11/21/2008  . DIVERTICULITIS OF COLON 07/23/2009  . Fistula   . GERD 01/06/2007  . PAD (peripheral artery disease) (HCC)   . CAD (coronary artery disease)     Mild by cath in remote past  . Atrial fibrillation (HCC)   . CHF (congestive heart failure) (HCC)     Medications:  Scheduled:  . acetaminophen  1,000 mg Oral TID  . fluticasone  1 spray Each Nare Daily  . gabapentin  100 mg Oral TID  . insulin aspart  0-9 Units Subcutaneous TID WC  . nystatin cream  1 application Topical BID  . ondansetron  4 mg Oral Q6H  . pantoprazole  40 mg Oral Daily  . piperacillin-tazobactam (ZOSYN)  IV  3.375 g Intravenous 3 times per day  . sodium chloride  500 mL Intravenous Once  . sodium chloride  3 mL Intravenous Q12H  . vancomycin  1,250 mg Intravenous Q24H  . Vitamin D (Ergocalciferol)  50,000 Units Oral Q30 days  . warfarin   1 mg Oral Once per day on Sun Tue Thu  . warfarin  1.5 mg Oral Once per day on Mon Wed Fri Sat    Assessment: Pharmacy consulted to dose and monitor warfarin in an 81 yo female admitted to ICU with colitis.  Patient currently ordered Cipro 400 mg IV q12h and Metronidazole 500 mg IV q8h.  Per med rec, patient takes warfarin 1 mg on Tues, Thurs, Sun and 1.5 mg on all other days.   10/7- INR: 1.5 Coumadin 1.5 mg 10/8- INR: 1.48 10/9 INR 1.62. Continue current regimen.  Goal of Therapy:  INR 2-3 Monitor CBC per policy   Plan:  Continue outpatient dosing as patient's INR on 9/12 was 2.1. Patient is also receiving IV antibiotic therapy with Metronidazole and Ciprofloxacin.  These agents may increase the INR.  Will check INR in AM to help guide dosing.   Pharmacy will continue to follow.  Kimon Loewen S 01/13/2015,7:02 AM

## 2015-01-13 NOTE — Progress Notes (Signed)
[facility] NAME: Kamara Allan    MR#:  [id number]  DATE OF BIRTH:  1928-07-22  SUBJECTIVE:  CHIEF COMPLAINT:   Chief Complaint  Patient presents with  . Weakness   -Patient had another episode of lethargy that lasted for a few minutes yesterday. Episodes of confusion last night. -Patient is more alert and oriented this morning. -Blood pressure has remained stable.  REVIEW OF SYSTEMS:  Review of Systems  Constitutional: Negative for fever and chills.  HENT: Negative for ear discharge and ear pain.   Eyes: Negative for blurred vision and double vision.  Respiratory: Negative for cough, shortness of breath and wheezing.   Cardiovascular: Negative for chest pain, palpitations and leg swelling.  Gastrointestinal: Negative for nausea, vomiting, abdominal pain, diarrhea and constipation.  Genitourinary: Negative for dysuria.  Neurological: Positive for weakness. Negative for dizziness, sensory change, focal weakness, seizures and headaches.       Confusion at times  Psychiatric/Behavioral: Negative for depression and memory loss. The patient is not nervous/anxious.     DRUG ALLERGIES:   Allergies  Allergen Reactions  . Hydroxyzine Hcl Other (See Comments)    Makes patient feel like she does not want to live    VITALS:  Blood pressure 136/72, pulse 77, temperature 97.7 F (36.5 C), temperature source Oral, resp. rate 19, height 5' 5"  (1.651 m), weight 73.1 kg (161 lb 2.5 oz), SpO2 98 %.  PHYSICAL EXAMINATION:  Physical Exam  GENERAL:  81 y.o.-year-old patient lying in the bed with no acute distress.  EYES: Pupils equal, round, reactive to light and accommodation. Postsurgical pupils noted. No scleral icterus. Extraocular muscles intact.  HEENT: Head atraumatic, normocephalic. Oropharynx and nasopharynx clear. Has only a few teeth left on the lower jaw, and gums look healthy. On the upper jaw the last molars on  either side up clear with significant cavities, some swelling of the gum noted. NECK:  Supple, no jugular venous distention. No thyroid enlargement, no tenderness.  LUNGS: Normal breath sounds bilaterally, no wheezing, rales,rhonchi or crepitation. No use of accessory muscles of respiration. Decreased bibasilar breath sounds CARDIOVASCULAR: S1, S2 normal. No rubs, or gallops. 3/6 systolic murmur present ABDOMEN: Soft, nontender, nondistended. Bowel sounds present. No organomegaly or mass.  EXTREMITIES: No pedal edema, cyanosis, or clubbing.  NEUROLOGIC: Cranial nerves II through XII are intact. Muscle strength 5/5 in all extremities. Sensation intact. Gait not checked.  PSYCHIATRIC: The patient is alert and oriented x 2.  SKIN: No obvious rash, lesion, or ulcer.    LABORATORY PANEL:   CBC  Recent Labs Lab 01/13/15 0635  WBC 9.9  HGB 10.0*  HCT 30.4*  PLT 182   ------------------------------------------------------------------------------------------------------------------  Chemistries   Recent Labs Lab 01/11/15 1428 01/11/15 1651  01/13/15 0635  NA  --  136  < > 137  K  --  3.6  < > 3.5  CL  --  101  < > 106  CO2  --  27  < > 24  GLUCOSE  --  99  < > 95  BUN  --  15  < > 6  CREATININE  --  1.02*  < > 0.76  CALCIUM  --  7.7*  < > 8.3*  MG 2.3  --   --   --   AST  --  25  --   --   ALT  --  25  --   --   ALKPHOS  --  50  --   --   BILITOT  --  0.9  --   --   < > = values in this interval not displayed. ------------------------------------------------------------------------------------------------------------------  Cardiac Enzymes  Recent Labs Lab 01/10/15 2027  TROPONINI <0.03   ------------------------------------------------------------------------------------------------------------------  RADIOLOGY:  Ct Angio Chest Pe W/cm &/or Wo Cm  01/12/2015   CLINICAL DATA:  Hypoxia.  EXAM: CT ANGIOGRAPHY CHEST WITH CONTRAST  TECHNIQUE: Multidetector CT imaging of  the chest was performed using the standard protocol during bolus administration of intravenous contrast. Multiplanar CT image reconstructions and MIPs were obtained to evaluate the vascular anatomy.  CONTRAST:  70m OMNIPAQUE IOHEXOL 350 MG/ML SOLN  COMPARISON:  CT scan of April 01, 2009.  FINDINGS: No pneumothorax is noted. Minimal bilateral pleural effusions are noted with adjacent subsegmental atelectasis. Focal airspace opacity is noted in the left lower lobe which may represent pneumonia. There is no evidence of pulmonary embolus. Atherosclerosis of thoracic aorta is noted without aneurysm or dissection. Visualized portion of upper abdomen is unremarkable. Coronary artery calcifications are noted. No mediastinal mass or adenopathy is noted. No significant osseous abnormality is noted.  Review of the MIP images confirms the above findings.  IMPRESSION: No evidence of pulmonary embolus is noted.  Coronary artery calcifications are noted suggesting coronary artery disease.  Atherosclerosis of thoracic aorta is noted without aneurysm or dissection.  Minimal bilateral pleural effusions are noted with adjacent subsegmental atelectasis. Focal airspace opacity is noted in the left lower lobe which may represent pneumonia.   Electronically Signed   By: JMarijo Conception M.D.   On: 01/12/2015 14:53    EKG:   Orders placed or performed during the hospital encounter of 01/10/15  . ED EKG  . ED EKG  . EKG 12-Lead  . EKG 12-Lead    ASSESSMENT AND PLAN:   81year old female with past medical history significant for atrial fibrillation on Coumadin, congestive heart failure, coronary artery disease, sleep apnea, COPD and diverticulosis, dementia admitted to the hospital secondary to weakness and somnolence.  #1 altered mental status-likely metabolic encephalopathy, -No history of dementia per family -CT of the head on admission with no acute findings. Cerebral atrophy noted. -Appreciate neurology consult.  ESR, CRP, RPR, B12/folate levels ordered -Hold her Remeron at this time -If no improvement, EEG is recommended  #2 sepsis-likely secondary to colitis and left lower lobe pneumonia.  -Couple of bad teeth, but no significant abscess noted. -Follow blood cultures. Antibiotics have been changed to vancomycin and Zosyn. -No significant diarrhea in the hospital monitor for now. -Appreciate GI consult. -Fluid resuscitation helped, no pressors needed  #3 hypoxia-known history of COPD, no exacerbation noted at this time. -Chest x-ray with only atelectasis. Currently requiring 3 L nasal cannula. -CT chest with atelectasis and also maybe left lower lobe infiltrate noted. No pulmonary embolism noted. Patient already on antibiotics  #4 atrial fibrillation-rate is controlled at this time. -Since blood pressure is better, low dose Cardizem started -Continue warfarin. INR is subtherapeutic. Pharmacy is adjusting the dose.  #5 congestive heart echo with normal ejection fraction. Likely has diastolic dysfunction.. Takes Lasix at home. -Currently does not appear to be fluid overloaded.  - hold Lasix at this time.  #6 DVT prophylaxis-on warfarin  Physical therapy consulted today.  All the records are reviewed and case discussed with Care Management/Social Workerr. Management plans discussed with the patient, family and they are in agreement.  CODE STATUS: Full code  TOTAL CRITICAL CARE TIME SPENT IN TAKING CARE  OF THIS PATIENT: 36 minutes.   POSSIBLE D/C IN 2 DAYS, DEPENDING ON CLINICAL CONDITION.   Jahzir Strohmeier M.D on 01/13/2015 at 9:11 AM  Between 7am to 6pm - Pager - [phone]  After 6pm go to [email or web address] - password [email or web address] [facility]  [facility]  Office  [phone]  CC: Primary care physician; No primary care provider on file.

## 2015-01-13 NOTE — Progress Notes (Signed)
NEUROLOGY NOTE  S: Pt had another episode today per nursing and chart but she denies it.  She states that she feels fine.  ROS neg x 8 systems  O: 98.9    153/64    87   20 No distress, nl weight Normocephalic, oropharynx clear Supple, no JVD CTA B, no wheezing RRR, no murmur No C/C/E  Alert and oriented x 3, nl speech and language PERRLA, EOMI, face symmetric 5/5 B, nl tone  A/P: 1. Encephalopathy-  Seems to fluctuate with no clear etiology;  Pt noted to have very elevated ESR which could be due to colitis or some other issue -  EEG -  Check procalcitonin, thyroglobulin ab, homocysteine -  Start B12 104mg SQ daily -  Continue antibiotics -  Will follow

## 2015-01-13 NOTE — Evaluation (Signed)
Physical Therapy Evaluation Patient Details Name: [name] Cunningham MRN: [id number] DOB: 1928-09-12 Today's Date: 01/13/2015   History of Present Illness  Pt here with colitis, feeling better and transfering from CCU   Clinical Impression  Pt does well with PT showing good confidence with ambulation and mobility and though she does need some assist with bed mobility she shows functional strength and good safety with ambulation using walker.  Pt has been having HHPT and would like to continue when she returns home.    Follow Up Recommendations Home health PT    Equipment Recommendations       Recommendations for Other Services       Precautions / Restrictions Precautions Precautions: Fall Restrictions Weight Bearing Restrictions: No      Mobility  Bed Mobility Overal bed mobility: Needs Assistance Bed Mobility: Supine to Sit     Supine to sit: Mod assist     General bed mobility comments: Pt with good effort getting to EOB, does need more assist than her normal  Transfers Overall transfer level: Modified independent Equipment used: Rolling walker (2 wheeled)             General transfer comment: Pt does well getting feet positioned and rising to standing w/o needing direct physical assist  Ambulation/Gait Ambulation/Gait assistance: Supervision Ambulation Distance (Feet): 50 Feet Assistive device: Rolling walker (2 wheeled)       General Gait Details: Pt with slow but consistant and relatively confident gait.  She has no safety issues and ultiamtely does well though she is slower than her baseline.   Stairs            Wheelchair Mobility    Modified Rankin (Stroke Patients Only)       Balance Overall balance assessment: Needs assistance Sitting-balance support:  (with walker pt is safe, poor balance w/o UE assist)                                         Pertinent Vitals/Pain Pain Assessment: No/denies pain    Home  Living Family/patient expects to be discharged to:: Private residence Living Arrangements: Children Available Help at Discharge: Family Type of Home: Apartment Home Access: Level entry       Home Equipment: Walker - 4 wheels;Cane - single point      Prior Function Level of Independence: Independent with assistive device(s)         Comments: Pt has been working with HHPT recently     Hand Dominance        Extremity/Trunk Assessment   Upper Extremity Assessment: Overall WFL for tasks assessed           Lower Extremity Assessment: Overall WFL for tasks assessed         Communication   Communication: No difficulties  Cognition Arousal/Alertness: Awake/alert Behavior During Therapy: WFL for tasks assessed/performed Overall Cognitive Status: Within Functional Limits for tasks assessed (has minimal dementia issues at baseline, appropriate with PT)                      General Comments      Exercises        Assessment/Plan    PT Assessment Patient needs continued PT services  PT Diagnosis Difficulty walking;Generalized weakness   PT Problem List Decreased activity tolerance;Decreased balance;Decreased mobility;Decreased safety awareness  PT Treatment Interventions Gait training;Functional mobility training;Therapeutic  activities;Therapeutic exercise;Balance training;Neuromuscular re-education   PT Goals (Current goals can be found in the Care Plan section) Acute Rehab PT Goals Patient Stated Goal: I want to go home PT Goal Formulation: With patient/family Time For Goal Achievement: 01/27/15 Potential to Achieve Goals: Good    Frequency Min 2X/week   Barriers to discharge        Co-evaluation               End of Session Equipment Utilized During Treatment: Gait belt Activity Tolerance: Patient tolerated treatment well Patient left: in chair;with nursing/sitter in room;with family/visitor present Nurse Communication: Mobility status          Time: 1610-9604 PT Time Calculation (min) (ACUTE ONLY): 16 min   Charges:   PT Evaluation $Initial PT Evaluation Tier I: 1 Procedure     PT G Codes:       Loran Senters, PT, DPT [phone]  Malachi Pro 01/13/2015, 1:45 PM

## 2015-01-14 ENCOUNTER — Inpatient Hospital Stay: Payer: Medicare Other

## 2015-01-14 LAB — BASIC METABOLIC PANEL
Anion gap: 9 (ref 5–15)
BUN: 6 mg/dL (ref 6–20)
CALCIUM: 8.8 mg/dL — AB (ref 8.9–10.3)
CHLORIDE: 103 mmol/L (ref 101–111)
CO2: 25 mmol/L (ref 22–32)
CREATININE: 0.72 mg/dL (ref 0.44–1.00)
GFR calc Af Amer: >60 mL/min (ref >60)
GFR calc non Af Amer: >60 mL/min (ref >60)
Glucose, Bld: 97 mg/dL (ref 65–99)
Potassium: 2.8 mmol/L — CL (ref 3.5–5.1)
SODIUM: 137 mmol/L (ref 135–145)

## 2015-01-14 LAB — CBC
HCT: 31.1 % — ABNORMAL LOW (ref 35.0–47.0)
HEMOGLOBIN: 10.2 g/dL — AB (ref 12.0–16.0)
MCH: 27.9 pg (ref 26.0–34.0)
MCHC: 32.9 g/dL (ref 32.0–36.0)
MCV: 84.7 fL (ref 80.0–100.0)
Platelets: 217 10*3/uL (ref 150–440)
RBC: 3.67 MIL/uL — ABNORMAL LOW (ref 3.80–5.20)
RDW: 14.1 % (ref 11.5–14.5)
WBC: 9.9 10*3/uL (ref 3.6–11.0)

## 2015-01-14 LAB — PROTIME-INR
INR: 1.72
PROTHROMBIN TIME: 20.3 s — AB (ref 11.4–15.0)

## 2015-01-14 LAB — VANCOMYCIN, TROUGH: VANCOMYCIN TR: 11 ug/mL (ref 10–20)

## 2015-01-14 LAB — POTASSIUM: Potassium: 3.9 mmol/L (ref 3.5–5.1)

## 2015-01-14 LAB — RPR: RPR: NONREACTIVE

## 2015-01-14 LAB — GLUCOSE, CAPILLARY
Glucose-Capillary: 95 mg/dL (ref 65–99)
Glucose-Capillary: 123 mg/dL — ABNORMAL HIGH (ref 65–99)
Glucose-Capillary: 119 mg/dL — ABNORMAL HIGH (ref 65–99)
Glucose-Capillary: 113 mg/dL — ABNORMAL HIGH (ref 65–99)

## 2015-01-14 LAB — HIGH SENSITIVITY CRP: CRP, High Sensitivity: 210.97 mg/L — ABNORMAL HIGH (ref 0.00–3.00)

## 2015-01-14 MED ORDER — DILTIAZEM HCL ER COATED BEADS 120 MG PO CP24
120.0000 mg | ORAL_CAPSULE | Freq: Every day | ORAL | Status: DC
  Administered 2015-01-14: 14:00:00 120 mg via ORAL
  Filled 2015-01-14: qty 1

## 2015-01-14 MED ORDER — POTASSIUM CHLORIDE CRYS ER 20 MEQ PO TBCR
40.0000 meq | EXTENDED_RELEASE_TABLET | ORAL | Status: AC
  Administered 2015-01-14 (×2): 40 meq via ORAL
  Filled 2015-01-14 (×2): qty 2

## 2015-01-14 MED ORDER — AMOXICILLIN-POT CLAVULANATE 875-125 MG PO TABS
1.0000 | ORAL_TABLET | Freq: Two times a day (BID) | ORAL | Status: DC
  Administered 2015-01-14 – 2015-01-15 (×3): 1 via ORAL
  Filled 2015-01-14 (×3): qty 1

## 2015-01-14 MED ORDER — POTASSIUM CHLORIDE CRYS ER 20 MEQ PO TBCR
40.0000 meq | EXTENDED_RELEASE_TABLET | Freq: Two times a day (BID) | ORAL | Status: DC
Start: 2015-01-14 — End: 2015-01-14
  Administered 2015-01-14: 40 meq via ORAL
  Filled 2015-01-14: qty 2

## 2015-01-14 MED ORDER — NYSTATIN-TRIAMCINOLONE 100000-0.1 UNIT/GM-% EX CREA
TOPICAL_CREAM | Freq: Three times a day (TID) | CUTANEOUS | Status: DC
  Administered 2015-01-14 – 2015-01-15 (×3): via TOPICAL
  Filled 2015-01-14: qty 15

## 2015-01-14 MED ORDER — VANCOMYCIN HCL 10 G IV SOLR: 1250.0000 mg | INTRAVENOUS | Status: DC

## 2015-01-14 NOTE — Progress Notes (Signed)
NEUROLOGY NOTE  S: No more episodes.  Daughter in room and endorses that she is back to baseline.  ROS neg x 8 systems  O: 98.3    145/85    96    20 No distress, nl weight Normocephalic, oropharynx clear Supple, no JVD CTA B, no wheezing RRR, no murmur No C/C/E  Alert and oriented x 3, nl speech and language PERRLA, EOMI, face symmetric 5/5 B, nl tone  EEG normal  A/P: 1. Encephalopathy- Seems to fluctuate with no clear etiology; Pt noted to have very elevated ESR which could be due to colitis or some other issue - PCP to follow thyroglobulin ab, homocysteine - Continue B12 1030mg SQ daily for total of seven days - Continue antibiotics - Will sign off, please call with questions -  No Neurology f/u needed

## 2015-01-14 NOTE — Progress Notes (Signed)
ANTIBIOTIC CONSULT NOTE - INITIAL  Pharmacy Consult for Vancomycin/Zosyn  Indication: sepsis  Allergies  Allergen Reactions  . Hydroxyzine Hcl Other (See Comments)    Makes patient feel like she does not want to live    Patient Measurements: Height:  (165.1 cm) Weight: 145 lb 3.2 oz (65.862 kg) IBW/kg (Calculated) : 57 Adjusted Body Weight:   Vital Signs: Temp: 98.7 F (37.1 C) (10/10 0445) Temp Source: Oral (10/10 0445) BP: 132/55 mmHg (10/10 0445) Pulse Rate: 50 (10/10 0445) Intake/Output from previous day: 10/09 0701 - 10/10 0700 In: 288 [P.O.:240; I.V.:3; IV Piggyback:45] Out: 400 [Urine:400] Intake/Output from this shift: Total I/O In: 120 [P.O.:120] Out: -   Labs:  Recent Labs  01/12/15 0553 01/13/15 0635 01/14/15 0409  WBC 14.1* 9.9 9.9  HGB 10.7* 10.0* 10.2*  PLT 196 182 217  CREATININE 0.80 0.76 0.72   Estimated Creatinine Clearance: 47.1 mL/min (by C-G formula based on Cr of 0.72).  Recent Labs  01/14/15 0409  [facility] 11     Microbiology: Recent Results (from the past 720 hour(s))  Culture, blood (routine x 2)     Status: None (Preliminary result)   Collection Time: 01/11/15  4:51 PM  Result Value Ref Range Status   Specimen Description BLOOD LEFT AC  Final   Special Requests BOTTLES DRAWN AEROBIC AND ANAEROBIC  10CC  Final   Culture NO GROWTH 3 DAYS  Final   Report Status PENDING  Incomplete  Culture, blood (routine x 2)     Status: None (Preliminary result)   Collection Time: 01/11/15  5:04 PM  Result Value Ref Range Status   Specimen Description BLOOD RIGHT AC  Final   Special Requests   Final    BOTTLES DRAWN AEROBIC AND ANAEROBIC  AER 10CC ANA 6CC   Culture NO GROWTH 3 DAYS  Final   Report Status PENDING  Incomplete    Medical History: Past Medical History  Diagnosis Date  . HYPERLIPIDEMIA 01/06/2007  . HYPERTENSION 01/06/2007  . COPD 01/06/2007  . ANXIETY 11/21/2008  . SLEEP APNEA 11/21/2008  . DIVERTICULITIS OF COLON  07/23/2009  . Fistula   . GERD 01/06/2007  . PAD (peripheral artery disease) (HCC)   . CAD (coronary artery disease)     Mild by cath in remote past  . Atrial fibrillation (HCC)   . CHF (congestive heart failure) (HCC)     Medications:  Prescriptions prior to admission  Medication Sig Dispense Refill Last Dose  . acetaminophen (TYLENOL) 500 MG tablet Take 1,000 mg by mouth 3 (three) times daily.    unknown at unknown  . alendronate (FOSAMAX) 70 MG tablet Take 70 mg by mouth once a week. Take with a full glass of water on an empty stomach.   unknown at unknown  . diltiazem (CARDIZEM CD) 360 MG 24 hr capsule Take 1 capsule (360 mg total) by mouth daily. 30 capsule 6 unknown at unknown  . fluticasone (FLONASE) 50 MCG/ACT nasal spray USE 2 SPRAYS IN NOSE DAILY AS DIRECTED 16 g 5 unknown at unknown  . furosemide (LASIX) 40 MG tablet Take 1 tablet (40 mg total) by mouth daily. 30 tablet 6 unknown at unknown  . gabapentin (NEURONTIN) 100 MG capsule Take 100 mg by mouth 3 (three) times daily.   unknown at unknown  . lisinopril (PRINIVIL,ZESTRIL) 10 MG tablet Take 2 tablets (20 mg total) by mouth daily. 30 tablet 11 unknown at unknown  . mirtazapine (REMERON) 15 MG tablet Take 15  mg by mouth at bedtime.   unknown at unknown  . nystatin cream (MYCOSTATIN) Apply 1 application topically 2 (two) times daily.   unknown at unknown  . omeprazole (PRILOSEC) 40 MG capsule Take 40 mg by mouth daily.   unknown at unknown  . ondansetron (ZOFRAN) 4 MG tablet Take 1 tablet (4 mg total) by mouth every 6 (six) hours. As needed for nausea or vomiting 12 tablet 0 01/09/2015 at Unknown time  . polyethylene glycol (MIRALAX / GLYCOLAX) packet Take 17 g by mouth daily as needed for mild constipation.    unknown at unknown  . traMADol (ULTRAM) 50 MG tablet Take 50 mg by mouth every 6 (six) hours as needed for moderate pain.    unknown at unknown  . Vitamin D, Ergocalciferol, (DRISDOL) 50000 UNITS CAPS Take 50,000 Units by  mouth every 30 (thirty) days.   unknown at unknown  . warfarin (COUMADIN) 1 MG tablet Take 1.5-2 mg by mouth daily. Take  by mouth on three days per week and take 1.5mg  by mouth on mondays, wednesdays, fridays, saturdays   unknown at unknown  . aspirin 81 MG tablet Take 1 tablet (81 mg total) by mouth daily. (Patient not taking: Reported on 01/10/2015) 30 tablet 0 Not Taking at Unknown time  . cilostazol (PLETAL) 50 MG tablet TAKE 1 TABLET (50 MG TOTAL) BY MOUTH 2 (TWO) TIMES DAILY. (Patient not taking: Reported on 01/10/2015) 60 tablet 11 Not Taking at Unknown time  . oxyCODONE-acetaminophen (PERCOCET/ROXICET) 5-325 MG per tablet Take 1-2 tablets by mouth every 6 (six) hours as needed (Moderate to severe pain). (Patient not taking: Reported on 01/10/2015) 17 tablet 0 Not Taking at Unknown time  . VOLTAREN 1 % GEL APPLY TWICE A DAY TO SHOULDER AS NEEDED FOR PAIN (Patient not taking: Reported on 01/10/2015) 100 g 2 Completed Course at Unknown time   Assessment: 81 y/o F with possible colitis, possible sepsis previously on ciprofloxacin and metronidazole with antibiotics broadened to vancomycin and Zosyn. Currently ordered Zosyn 3.375g IV q8h EI and Vancomycin  IV q24h. Vancomycin trough resulted at 11 mcg/ml.  Ke: 0.048 hr-1, Vd: 44.4 L  Goal of Therapy:  Vancomycin trough level 15-20 mcg/ml  Plan:  Will continue Zosyn 3.375 g EI q 8 hours.   Will increase vancomycin to 1250 mg iv q 18 hours and plan on checking a trough with the 4th dose. Will f/u culture results and renal function.    Clovia Cuff, PharmD, BCPS 01/14/2015 10:28 AM

## 2015-01-14 NOTE — Plan of Care (Signed)
Problem: Discharge Progression Outcomes Goal: Other Discharge Outcomes/Goals Outcome: Progressing Plan of Care Progressing to Goal: Patient has no complaints of pain. Tolerating diet well. VSS. Potassium given for level of 2.8. Up to chair for few hours. 1 assist to [facility].

## 2015-01-14 NOTE — Progress Notes (Addendum)
[facility] [place] [facility]   PATIENT NAME: [name] Cunningham    MR#:  [id number]  DATE OF BIRTH:  11-06-1928  SUBJECTIVE:  CHIEF COMPLAINT:   Chief Complaint  Patient presents with  . Weakness   -very alert, sitting in chair, complains of itching in perineal area - off o2 during daytime, daughter concerned about overnight hypoxia - mental status- back to baseline  REVIEW OF SYSTEMS:  Review of Systems  Constitutional: Negative for fever and chills.  HENT: Negative for ear discharge and ear pain.   Eyes: Negative for blurred vision and double vision.  Respiratory: Negative for cough, shortness of breath and wheezing.   Cardiovascular: Negative for chest pain, palpitations and leg swelling.  Gastrointestinal: Negative for nausea, vomiting, abdominal pain, diarrhea and constipation.  Genitourinary: Negative for dysuria.  Neurological: Positive for weakness. Negative for dizziness, sensory change, focal weakness, seizures and headaches.       Confusion at times  Psychiatric/Behavioral: Negative for depression and memory loss. The patient is not nervous/anxious.     DRUG ALLERGIES:   Allergies  Allergen Reactions  . Hydroxyzine Hcl Other (See Comments)    Makes patient feel like she does not want to live    VITALS:  Blood pressure 132/55, pulse 50, temperature 98.7 F (37.1 C), temperature source Oral, resp. rate 20, height 5\' 5"  (1.651 m), weight 65.862 kg (145 lb 3.2 oz), SpO2 95 %.  PHYSICAL EXAMINATION:  Physical Exam  GENERAL:  81 y.o.-year-old patient sitting in chair with no acute distress.  EYES: Pupils equal, round, reactive to light and accommodation. Postsurgical pupils noted. No scleral icterus. Extraocular muscles intact.  HEENT: Head atraumatic, normocephalic. Oropharynx and nasopharynx clear. Has only a few teeth left on the lower jaw, and gums look healthy. On the upper jaw the last molars on either side up clear with  significant cavities, some swelling of the gum noted. NECK:  Supple, no jugular venous distention. No thyroid enlargement, no tenderness.  LUNGS: Normal breath sounds bilaterally, no wheezing, rales,rhonchi or crepitation. No use of accessory muscles of respiration. Decreased bibasilar breath sounds CARDIOVASCULAR: S1, S2 normal. No rubs, or gallops. 3/6 systolic murmur present ABDOMEN: Soft, nontender, nondistended. Bowel sounds present. No organomegaly or mass.  Erythema on labial folds and in groin area noted EXTREMITIES: No pedal edema, cyanosis, or clubbing.  NEUROLOGIC: Cranial nerves II through XII are intact. Muscle strength 5/5 in all extremities. Sensation intact. Gait not checked.  PSYCHIATRIC: The patient is alert and oriented x 3.  SKIN: No obvious rash, lesion, or ulcer. Erythema in perineal, groin area   LABORATORY PANEL:   CBC  Recent Labs Lab 01/14/15 0409  WBC 9.9  HGB 10.2*  HCT 31.1*  PLT 217   ------------------------------------------------------------------------------------------------------------------  Chemistries   Recent Labs Lab 01/11/15 1428 01/11/15 1651  01/14/15 0409  NA  --  136  < > 137  K  --  3.6  < > 2.8*  CL  --  101  < > 103  CO2  --  27  < > 25  GLUCOSE  --  99  < > 97  BUN  --  15  < > 6  CREATININE  --  1.02*  < > 0.72  CALCIUM  --  7.7*  < > 8.8*  MG 2.3  --   --   --   AST  --  25  --   --   ALT  --  25  --   --  ALKPHOS  --  50  --   --   BILITOT  --  0.9  --   --   < > = values in this interval not displayed. ------------------------------------------------------------------------------------------------------------------  Cardiac Enzymes  Recent Labs Lab 01/10/15 2027  TROPONINI <0.03   ------------------------------------------------------------------------------------------------------------------  RADIOLOGY:  Ct Angio Chest Pe W/cm &/or Wo Cm  01/12/2015   CLINICAL DATA:  Hypoxia.  EXAM: CT ANGIOGRAPHY  CHEST WITH CONTRAST  TECHNIQUE: Multidetector CT imaging of the chest was performed using the standard protocol during bolus administration of intravenous contrast. Multiplanar CT image reconstructions and MIPs were obtained to evaluate the vascular anatomy.  CONTRAST:  75mL OMNIPAQUE IOHEXOL 350 MG/ML SOLN  COMPARISON:  CT scan of April 01, 2009.  FINDINGS: No pneumothorax is noted. Minimal bilateral pleural effusions are noted with adjacent subsegmental atelectasis. Focal airspace opacity is noted in the left lower lobe which may represent pneumonia. There is no evidence of pulmonary embolus. Atherosclerosis of thoracic aorta is noted without aneurysm or dissection. Visualized portion of upper abdomen is unremarkable. Coronary artery calcifications are noted. No mediastinal mass or adenopathy is noted. No significant osseous abnormality is noted.  Review of the MIP images confirms the above findings.  IMPRESSION: No evidence of pulmonary embolus is noted.  Coronary artery calcifications are noted suggesting coronary artery disease.  Atherosclerosis of thoracic aorta is noted without aneurysm or dissection.  Minimal bilateral pleural effusions are noted with adjacent subsegmental atelectasis. Focal airspace opacity is noted in the left lower lobe which may represent pneumonia.   Electronically Signed   By: Lupita Raider, M.D.   On: 01/12/2015 14:53    EKG:   Orders placed or performed during the hospital encounter of 01/10/15  . ED EKG  . ED EKG  . EKG 12-Lead  . EKG 12-Lead    ASSESSMENT AND PLAN:   81 year old female with past medical history significant for atrial fibrillation on Coumadin, congestive heart failure, coronary artery disease, sleep apnea, COPD and diverticulosis, dementia admitted to the hospital secondary to weakness and somnolence.  #1 altered mental status-likely metabolic encephalopathy, RESOLVED NOW -No history of dementia per family -CT of the head on admission with no  acute findings. Cerebral atrophy noted. -Appreciate neurology consult. Low normal B12- so started supplements -Hold her Remeron at this time  #2 sepsis-likely secondary to left lower lobe pneumonia.  -Couple of bad teeth, but no significant abscess noted. -negative blood cultures. Discontinue vancomycin and Zosyn. -No evidence of colitis now. augmentin started -Appreciate GI consult. -no pressors needed  #3 hypoxia-known history of COPD, no exacerbation noted at this time. -Chest x-ray with only atelectasis. Off oxygen today, check overnight oximetry. -CT chest with atelectasis and also maybe left lower lobe infiltrate noted. No pulmonary embolism noted. Patient already on antibiotics  #4 atrial fibrillation-rate is controlled at this time. -Since blood pressure is better, low dose Cardizem started -Continue warfarin. INR is subtherapeutic. Pharmacy is adjusting the dose.  #5 congestive heart echo with normal ejection fraction. Likely has diastolic dysfunction.. Takes Lasix at home. -Currently does not appear to be fluid overloaded.  - hold Lasix at this time.  #6 DVT prophylaxis-on warfarin  #7 Hypokalemia- being replaced, follow up for now  Physical therapy consulted - recommended home health Discussed with daughter at bedside.  All the records are reviewed and case discussed with Care Management/Social Workerr. Management plans discussed with the patient, family and they are in agreement.  CODE STATUS: Full code  TOTAL CRITICAL  CARE TIME SPENT IN TAKING CARE OF THIS PATIENT: 36 minutes.   POSSIBLE D/C TOMORROW, DEPENDING ON CLINICAL CONDITION.   March Joos M.D on 01/14/2015 at 12:44 PM  Between 7am to 6pm - Pager - [phone]  After 6pm go to [email or web address] - password [email or web address] [facility]  [place] [facility]  Office  [phone]  CC: Primary care physician; No primary care provider on file.

## 2015-01-14 NOTE — Progress Notes (Addendum)
ANTICOAGULATION CONSULT NOTE - Initial Consult  Pharmacy Consult for warfarin Indication: atrial fibrillation  Allergies  Allergen Reactions  . Hydroxyzine Hcl Other (See Comments)    Makes patient feel like she does not want to live    Patient Measurements: Height:  (165.1 cm) Weight: 145 lb 3.2 oz (65.862 kg) IBW/kg (Calculated) : 57  Vital Signs: Temp: 98.7 F (37.1 C) (10/10 0445) Temp Source: Oral (10/10 0445) BP: 132/55 mmHg (10/10 0445) Pulse Rate: 50 (10/10 0445)  Labs:  Recent Labs  01/12/15 0553 01/13/15 0635 01/14/15 0409  HGB 10.7* 10.0* 10.2*  HCT 33.1* 30.4* 31.1*  PLT 196 182 217  LABPROT 18.1* 19.4* 20.3*  INR 1.48 1.62 1.72  CREATININE 0.80 0.76 0.72    Estimated Creatinine Clearance: 47.1 mL/min (by C-G formula based on Cr of 0.72).   Medical History: Past Medical History  Diagnosis Date  . HYPERLIPIDEMIA 01/06/2007  . HYPERTENSION 01/06/2007  . COPD 01/06/2007  . ANXIETY 11/21/2008  . SLEEP APNEA 11/21/2008  . DIVERTICULITIS OF COLON 07/23/2009  . Fistula   . GERD 01/06/2007  . PAD (peripheral artery disease) (HCC)   . CAD (coronary artery disease)     Mild by cath in remote past  . Atrial fibrillation (HCC)   . CHF (congestive heart failure) (HCC)     Medications:  Scheduled:  . cyanocobalamin  1,000 mcg Intramuscular Q0600  . diltiazem  30 mg Oral 4 times per day  . fluticasone  1 spray Each Nare Daily  . gabapentin  100 mg Oral TID  . insulin aspart  0-9 Units Subcutaneous TID WC  . nystatin cream  1 application Topical BID  . pantoprazole  40 mg Oral Daily  . piperacillin-tazobactam (ZOSYN)  IV  3.375 g Intravenous 3 times per day  . potassium chloride  40 mEq Oral BID  . sodium chloride  3 mL Intravenous Q12H  . sodium chloride  3 mL Intravenous Q12H  . vancomycin  1,250 mg Intravenous Q24H  . Vitamin D (Ergocalciferol)  50,000 Units Oral Q30 days  . warfarin  1 mg Oral Once per day on Sun Tue Thu  . warfarin  1.5 mg  Oral Once per day on Mon Wed Fri Sat    Assessment: Pharmacy consulted to dose and monitor warfarin in an 81 yo female admitted to ICU with colitis.  Patient currently ordered Cipro 400 mg IV q12h and Metronidazole 500 mg IV q8h.  Per med rec, patient takes warfarin 1 mg on Tues, Thurs, Sun and 1.5 mg on all other days.   10/7- INR: 1.5 Coumadin 1.5 mg 10/8- INR: 1.48 10/9 INR 1.62. Continue current regimen. 10/10 INR 1.72. Continue current regimen. 1.5 mg dose due today.  Goal of Therapy:  INR 2-3 Monitor CBC per policy   Plan:  Continue outpatient dosing as patient's INR on 9/12 was 2.1. Patient is also receiving IV antibiotic therapy with Metronidazole and Ciprofloxacin.  These agents may increase the INR.  Will check INR in AM to help guide dosing.   10/10 Daily INR's ordered while patient is on antibiotic therapy.  Pharmacy will continue to follow.  Evany Schecter S 01/14/2015,6:56 AM

## 2015-01-14 NOTE — Care Management Important Message (Signed)
Important Message  Patient Details  Name: [name] Cunningham MRN: [id number] Date of Birth: 1929-01-26   Medicare Important Message Given:  Yes-third notification given    Gwenette Greet, RN 01/14/2015, 7:59 AM

## 2015-01-14 NOTE — Plan of Care (Signed)
Problem: Discharge Progression Outcomes Goal: Other Discharge Outcomes/Goals Plan of care progress to goal: Denies pain VSS-FSBS 105 Diet: good apatite Activity: one assist up to [facility]

## 2015-01-15 LAB — BASIC METABOLIC PANEL
Anion gap: 11 (ref 5–15)
BUN: 7 mg/dL (ref 6–20)
CALCIUM: 8.9 mg/dL (ref 8.9–10.3)
CO2: 22 mmol/L (ref 22–32)
CREATININE: 0.81 mg/dL (ref 0.44–1.00)
Chloride: 103 mmol/L (ref 101–111)
GFR calc Af Amer: >60 mL/min (ref >60)
GFR calc non Af Amer: >60 mL/min (ref >60)
GLUCOSE: 98 mg/dL (ref 65–99)
Potassium: 3.8 mmol/L (ref 3.5–5.1)
Sodium: 136 mmol/L (ref 135–145)

## 2015-01-15 LAB — THYROGLOBULIN ANTIBODY

## 2015-01-15 LAB — THYROID PEROXIDASE ANTIBODY: THYROID PEROXIDASE ANTIBODY: 19 [IU]/mL (ref 0–34)

## 2015-01-15 LAB — HOMOCYSTEINE: HOMOCYSTEINE-NORM: 15.8 umol/L — AB (ref 0.0–15.0)

## 2015-01-15 LAB — PROTIME-INR
INR: 1.65
PROTHROMBIN TIME: 19.7 s — AB (ref 11.4–15.0)

## 2015-01-15 LAB — GLUCOSE, CAPILLARY: Glucose-Capillary: 99 mg/dL (ref 65–99)

## 2015-01-15 MED ORDER — DILTIAZEM HCL ER COATED BEADS 180 MG PO CP24: 180.0000 mg | ORAL_CAPSULE | Freq: Every day | ORAL | Status: DC

## 2015-01-15 MED ORDER — DILTIAZEM HCL ER COATED BEADS 180 MG PO CP24
180.0000 mg | ORAL_CAPSULE | Freq: Every day | ORAL | Status: DC
  Administered 2015-01-15: 180 mg via ORAL
  Filled 2015-01-15: qty 1

## 2015-01-15 MED ORDER — AMOXICILLIN-POT CLAVULANATE 875-125 MG PO TABS: 1.0000 | ORAL_TABLET | Freq: Two times a day (BID) | ORAL | Status: DC

## 2015-01-15 MED ORDER — WARFARIN SODIUM 2 MG PO TABS
1.0000 mg | ORAL_TABLET | Freq: Once | ORAL | Status: AC
  Administered 2015-01-15: 1 mg via ORAL

## 2015-01-15 MED ORDER — WARFARIN - PHARMACIST DOSING INPATIENT: Freq: Every day | Status: DC

## 2015-01-15 MED ORDER — VITAMIN B-12 100 MCG PO TABS: 100.0000 ug | ORAL_TABLET | Freq: Every day | ORAL | Status: AC

## 2015-01-15 MED ORDER — ACETAMINOPHEN 325 MG PO TABS: 650.0000 mg | ORAL_TABLET | Freq: Four times a day (QID) | ORAL | Status: AC | PRN

## 2015-01-15 MED ORDER — WARFARIN SODIUM 2 MG PO TABS: 2.0000 mg | ORAL_TABLET | Freq: Every day | ORAL | Status: DC

## 2015-01-15 NOTE — Plan of Care (Signed)
Problem: Discharge Progression Outcomes Goal: Other Discharge Outcomes/Goals Outcome: Progressing Plan of care progress to goal: Pt having no pain VSS Potassium level WNL Pt up to [facility] with assistance

## 2015-01-15 NOTE — Discharge Summary (Signed)
[facility] - [place] at [facility]   PATIENT NAME: [name] Cunningham    MR#:  [id number]  DATE OF BIRTH:  Apr 24, 1928  DATE OF ADMISSION:  01/10/2015 ADMITTING PHYSICIAN: Arnaldo Natal, MD  DATE OF DISCHARGE: 01/15/2015  PRIMARY CARE PHYSICIAN: No primary care provider on file.    ADMISSION DIAGNOSIS:  Hypokalemia [E87.6] Atelectasis [J98.11] Colitis [K52.9] Non-intractable vomiting with nausea, vomiting of unspecified type [R11.2]  DISCHARGE DIAGNOSIS:  Active Problems:   Colitis   SECONDARY DIAGNOSIS:   Past Medical History  Diagnosis Date  . HYPERLIPIDEMIA 01/06/2007  . HYPERTENSION 01/06/2007  . COPD 01/06/2007  . ANXIETY 11/21/2008  . SLEEP APNEA 11/21/2008  . DIVERTICULITIS OF COLON 07/23/2009  . Fistula   . GERD 01/06/2007  . PAD (peripheral artery disease) (HCC)   . CAD (coronary artery disease)     Mild by cath in remote past  . Atrial fibrillation (HCC)   . CHF (congestive heart failure) [facility])     HOSPITAL COURSE:   80 year old female with past medical history significant for atrial fibrillation on Coumadin, congestive heart failure, coronary artery disease, sleep apnea, COPD and diverticulosis, dementia admitted to the hospital secondary to weakness and somnolence.  #1 altered mental status-likely metabolic encephalopathy, RESOLVED NOW -CT of the head on admission with no acute findings. Cerebral atrophy noted. -Appreciate neurology consult. Low normal B12- so started supplements -Hold her Remeron at this time  #2 sepsis-likely secondary to left lower lobe pneumonia.  -Couple of bad teeth, but no significant abscess noted. -negative blood cultures. on augmentin now -Appreciate GI consult. -no pressors needed  #3 hypoxia-known history of COPD, no exacerbation noted at this time. -Chest x-ray with only atelectasis. Off oxygen, no hypoxia overnight. -CT chest with atelectasis and also maybe left lower lobe infiltrate noted. No  pulmonary embolism noted. Patient already on antibiotics  #4 atrial fibrillation-rate is controlled at this time. -Since blood pressure is better,  Cardizem restarted at a lower dose -Continue warfarin. INR is subtherapeutic. continue to follow up as outpatient as well  #5 congestive heart echo with normal ejection fraction. Likely has diastolic dysfunction.. Takes Lasix at home. -Currently does not appear to be fluid overloaded.  - continue to hold Lasix at this time.  Physical therapy consulted - recommended home health Discharge today  DISCHARGE CONDITIONS:   Stable  CONSULTS OBTAINED:  Treatment Team:  Scot Jun, MD  DRUG ALLERGIES:   Allergies  Allergen Reactions  . Hydroxyzine Hcl Other (See Comments)    Makes patient feel like she does not want to live    DISCHARGE MEDICATIONS:   Current Discharge Medication List    START taking these medications   Details  amoxicillin-clavulanate (AUGMENTIN) 875-125 MG tablet Take 1 tablet by mouth every 12 (twelve) hours. Qty: 12 tablet, Refills: 0    vitamin B-12 (CYANOCOBALAMIN) 100 MCG tablet Take 1 tablet (100 mcg total) by mouth daily. Qty: 30 tablet, Refills: 0      CONTINUE these medications which have CHANGED   Details  acetaminophen (TYLENOL) 325 MG tablet Take 2 tablets (650 mg total) by mouth every 6 (six) hours as needed for mild pain (headache). Qty: 30 tablet, Refills: 0    diltiazem (CARDIZEM CD) 180 MG 24 hr capsule Take 1 capsule (180 mg total) by mouth daily. Qty: 30 capsule, Refills: 1      CONTINUE these medications which have NOT CHANGED   Details  alendronate (FOSAMAX) 70 MG tablet Take 70 mg  by mouth once a week. Take with a full glass of water on an empty stomach.    fluticasone (FLONASE) 50 MCG/ACT nasal spray USE 2 SPRAYS IN NOSE DAILY AS DIRECTED Qty: 16 g, Refills: 5    gabapentin (NEURONTIN) 100 MG capsule Take 100 mg by mouth 3 (three) times daily.    nystatin cream  (MYCOSTATIN) Apply 1 application topically 2 (two) times daily.    omeprazole (PRILOSEC) 40 MG capsule Take 40 mg by mouth daily.    ondansetron (ZOFRAN) 4 MG tablet Take 1 tablet (4 mg total) by mouth every 6 (six) hours. As needed for nausea or vomiting Qty: 12 tablet, Refills: 0    polyethylene glycol (MIRALAX / GLYCOLAX) packet Take 17 g by mouth daily as needed for mild constipation.     traMADol (ULTRAM) 50 MG tablet Take 50 mg by mouth every 6 (six) hours as needed for moderate pain.     Vitamin D, Ergocalciferol, (DRISDOL) 50000 UNITS CAPS Take 50,000 Units by mouth every 30 (thirty) days.    warfarin (COUMADIN) 1 MG tablet Take 1.5-2 mg by mouth daily. Take 1mg  by mouth on three days per week and take 1.5mg  by mouth on mondays, wednesdays, fridays, saturdays      STOP taking these medications     furosemide (LASIX) 40 MG tablet      lisinopril (PRINIVIL,ZESTRIL) 10 MG tablet      mirtazapine (REMERON) 15 MG tablet      aspirin 81 MG tablet      cilostazol (PLETAL) 50 MG tablet      oxyCODONE-acetaminophen (PERCOCET/ROXICET) 5-325 MG per tablet      VOLTAREN 1 % GEL          DISCHARGE INSTRUCTIONS:   1. PCP follow-up in 1-2 weeks 2. Home health  If you experience worsening of your admission symptoms, develop shortness of breath, life threatening emergency, suicidal or homicidal thoughts you must seek medical attention immediately by calling 911 or calling your MD immediately  if symptoms less severe.  You Must read complete instructions/literature along with all the possible adverse reactions/side effects for all the Medicines you take and that have been prescribed to you. Take any new Medicines after you have completely understood and accept all the possible adverse reactions/side effects.   Please note  You were cared for by a hospitalist during your hospital stay. If you have any questions about your discharge medications or the care you received while you  were in the hospital after you are discharged, you can call the unit and asked to speak with the hospitalist on call if the hospitalist that took care of you is not available. Once you are discharged, your primary care physician will handle any further medical issues. Please note that NO REFILLS for any discharge medications will be authorized once you are discharged, as it is imperative that you return to your primary care physician (or establish a relationship with a primary care physician if you do not have one) for your aftercare needs so that they can reassess your need for medications and monitor your lab values.    Today   CHIEF COMPLAINT:   Chief Complaint  Patient presents with  . Weakness    VITAL SIGNS:  Blood pressure 132/64, pulse 67, temperature 98.6 F (37 C), temperature source Oral, resp. rate 18, height 5\' 5"  (1.651 m), weight 68.085 kg (150 lb 1.6 oz), SpO2 96 %.  I/O:   Intake/Output Summary (Last 24 hours) at  01/15/15 1127 Last data filed at 01/15/15 0951  Gross per 24 hour  Intake    480 ml  Output    150 ml  Net    330 ml    PHYSICAL EXAMINATION:   Physical Exam  GENERAL: 81 y.o.-year-old patient sitting in chair with no acute distress.  EYES: Pupils equal, round, reactive to light and accommodation. Postsurgical pupils noted. No scleral icterus. Extraocular muscles intact.  HEENT: Head atraumatic, normocephalic. Oropharynx and nasopharynx clear. Has only a few teeth left on the lower jaw, and gums look healthy. On the upper jaw the last molars on either side up clear with significant cavities, some swelling of the gum noted. NECK: Supple, no jugular venous distention. No thyroid enlargement, no tenderness.  LUNGS: Normal breath sounds bilaterally, no wheezing, rales,rhonchi or crepitation. No use of accessory muscles of respiration. Decreased bibasilar breath sounds CARDIOVASCULAR: S1, S2 normal. No rubs, or gallops. 3/6 systolic murmur  present ABDOMEN: Soft, nontender, nondistended. Bowel sounds present. No organomegaly or mass.  Erythema on labial folds and in groin area noted EXTREMITIES: No pedal edema, cyanosis, or clubbing.  NEUROLOGIC: Cranial nerves II through XII are intact. Muscle strength 5/5 in all extremities. Sensation intact. Gait not checked.  PSYCHIATRIC: The patient is alert and oriented x 3.  SKIN: No obvious rash, lesion, or ulcer. Erythema in perineal, groin area  DATA REVIEW:   CBC  Recent Labs Lab 01/14/15 0409  WBC 9.9  HGB 10.2*  HCT 31.1*  PLT 217    Chemistries   Recent Labs Lab 01/11/15 1428 01/11/15 1651  01/15/15 0533  NA  --  136  < > 136  K  --  3.6  < > 3.8  CL  --  101  < > 103  CO2  --  27  < > 22  GLUCOSE  --  99  < > 98  BUN  --  15  < > 7  CREATININE  --  1.02*  < > 0.81  CALCIUM  --  7.7*  < > 8.9  MG 2.3  --   --   --   AST  --  25  --   --   ALT  --  25  --   --   ALKPHOS  --  50  --   --   BILITOT  --  0.9  --   --   < > = values in this interval not displayed.  Cardiac Enzymes  Recent Labs Lab 01/10/15 2027  TROPONINI <0.03    Microbiology Results  Results for orders placed or performed during the hospital encounter of 01/10/15  Culture, blood (routine x 2)     Status: None (Preliminary result)   Collection Time: 01/11/15  4:51 PM  Result Value Ref Range Status   Specimen Description BLOOD LEFT AC  Final   Special Requests BOTTLES DRAWN AEROBIC AND ANAEROBIC  10CC  Final   Culture NO GROWTH 3 DAYS  Final   Report Status PENDING  Incomplete  Culture, blood (routine x 2)     Status: None (Preliminary result)   Collection Time: 01/11/15  5:04 PM  Result Value Ref Range Status   Specimen Description BLOOD RIGHT AC  Final   Special Requests   Final    BOTTLES DRAWN AEROBIC AND ANAEROBIC  AER 10CC ANA 6CC   Culture NO GROWTH 3 DAYS  Final   Report Status PENDING  Incomplete    RADIOLOGY:  No  results found.  EKG:   Orders placed or  performed during the hospital encounter of 01/10/15  . ED EKG  . ED EKG  . EKG 12-Lead  . EKG 12-Lead      Management plans discussed with the patient, family and they are in agreement.  CODE STATUS:     Code Status Orders        Start     Ordered   01/11/15 0104  Full code   Continuous     01/11/15 0104    Advance Directive Documentation        Most Recent Value   Type of Advance Directive  Healthcare Power of Attorney, Living will   Pre-existing out of facility DNR order (yellow form or pink MOST form)     "MOST" Form in Place?        TOTAL TIME TAKING CARE OF THIS PATIENT: 38 minutes.    Enid Baas M.D on 01/15/2015 at 11:27 AM  Between 7am to 6pm - Pager - [phone]  After 6pm go to [email or web address] - password [email or web address] [email or web address]  [place] [facility]  Office  [phone]  CC: Primary care physician; No primary care provider on file.

## 2015-01-15 NOTE — Discharge Instructions (Signed)
Apply desitin or diaper dermatitis ointment to perineal area

## 2015-01-15 NOTE — Progress Notes (Signed)
Discharge instructions given, pt. verbalized understanding.

## 2015-01-15 NOTE — Progress Notes (Signed)
ANTICOAGULATION CONSULT NOTE - Initial Consult  Pharmacy Consult for warfarin Indication: atrial fibrillation  Allergies  Allergen Reactions  . Hydroxyzine Hcl Other (See Comments)    Makes patient feel like she does not want to live    Patient Measurements: Height:  (165.1 cm) Weight: 150 lb 1.6 oz (68.085 kg) IBW/kg (Calculated) : 57  Vital Signs: Temp: 97.5 F (36.4 C) (10/11 0439) Temp Source: Oral (10/11 0439) BP: 123/41 mmHg (10/11 0439) Pulse Rate: 85 (10/11 0439)  Labs:  Recent Labs  01/13/15 0635 01/14/15 0409 01/15/15 0533  HGB 10.0* 10.2*  --   HCT 30.4* 31.1*  --   PLT 182 217  --   LABPROT 19.4* 20.3* 19.7*  INR 1.62 1.72 1.65  CREATININE 0.76 0.72 0.81    Estimated Creatinine Clearance: 46.5 mL/min (by C-G formula based on Cr of 0.81).   Medical History: Past Medical History  Diagnosis Date  . HYPERLIPIDEMIA 01/06/2007  . HYPERTENSION 01/06/2007  . COPD 01/06/2007  . ANXIETY 11/21/2008  . SLEEP APNEA 11/21/2008  . DIVERTICULITIS OF COLON 07/23/2009  . Fistula   . GERD 01/06/2007  . PAD (peripheral artery disease) (HCC)   . CAD (coronary artery disease)     Mild by cath in remote past  . Atrial fibrillation (HCC)   . CHF (congestive heart failure) (HCC)     Medications:  Scheduled:  . amoxicillin-clavulanate  1 tablet Oral Q12H  . cyanocobalamin  1,000 mcg Intramuscular Q0600  . diltiazem  180 mg Oral Daily  . fluticasone  1 spray Each Nare Daily  . gabapentin  100 mg Oral TID  . insulin aspart  0-9 Units Subcutaneous TID WC  . nystatin-triamcinolone   Topical TID  . pantoprazole  40 mg Oral Daily  . sodium chloride  3 mL Intravenous Q12H  . sodium chloride  3 mL Intravenous Q12H  . Vitamin D (Ergocalciferol)  50,000 Units Oral Q30 days  . warfarin  1 mg Oral Once  . [START ON 01/16/2015] warfarin  2 mg Oral q1800  . Warfarin - Pharmacist Dosing Inpatient   Does not apply q1800    Assessment: Pharmacy consulted to dose and  monitor warfarin in an 81 yo female admitted to ICU with colitis.  Patient currently ordered Cipro 400 mg IV q12h and Metronidazole 500 mg IV q8h.  Per med rec, patient takes warfarin 1 mg on Tues, Thurs, Sun and 1.5 mg on all other days.   10/7- INR: 1.5 Coumadin 1.5 mg 10/8- INR: 1.48 10/9 INR 1.62. Continue current regimen. 10/10 INR 1.72. Continue current regimen. 1.5 mg dose due today. 10/11 INR 1.65  Goal of Therapy:  INR 2-3 Monitor CBC per policy   Plan:  Today's INR is down from previous day. Patient has already received Coumadin  this morning. Will order an additional  dose for today and increase dose to  starting tomorrow.   10/10 Daily INR's ordered while patient is on antibiotic therapy.  Pharmacy will continue to follow.  Clovia Cuff, PharmD, BCPS 01/15/2015 9:45 AM

## 2015-01-15 NOTE — Care Management (Addendum)
Discharge to home today per Dr. Nemiah Commander. Will resume physical therapy with [facility]. Will fax orders to [phone]. Daughter will transport.  Would like a nurse to come to the home. Sent [facility] adjuster, chartered a message to disgard information that was sent. Daughter would like [place], Faxed information to [place]. Gwenette Greet RN MSN Care Management [phone]

## 2015-01-16 LAB — CULTURE, BLOOD (ROUTINE X 2)
Culture: NO GROWTH
Culture: NO GROWTH

## 2015-01-19 ENCOUNTER — Encounter (HOSPITAL_COMMUNITY): Payer: Self-pay | Admitting: Emergency Medicine

## 2015-01-19 ENCOUNTER — Emergency Department (HOSPITAL_COMMUNITY): Payer: Medicare Other

## 2015-01-19 ENCOUNTER — Observation Stay (HOSPITAL_COMMUNITY)
Admission: EM | Admit: 2015-01-19 | Discharge: 2015-01-21 | Disposition: A | Discharge status: Home / Self Care | Payer: Medicare Other | Attending: Internal Medicine | Admitting: Internal Medicine

## 2015-01-19 DIAGNOSIS — I4891 Unspecified atrial fibrillation: Secondary | ICD-10-CM | POA: Diagnosis not present

## 2015-01-19 DIAGNOSIS — R531 Weakness: Secondary | ICD-10-CM | POA: Diagnosis not present

## 2015-01-19 DIAGNOSIS — R21 Rash and other nonspecific skin eruption: Secondary | ICD-10-CM | POA: Diagnosis not present

## 2015-01-19 DIAGNOSIS — I2510 Atherosclerotic heart disease of native coronary artery without angina pectoris: Secondary | ICD-10-CM | POA: Insufficient documentation

## 2015-01-19 DIAGNOSIS — E785 Hyperlipidemia, unspecified: Secondary | ICD-10-CM | POA: Diagnosis not present

## 2015-01-19 DIAGNOSIS — D649 Anemia, unspecified: Secondary | ICD-10-CM | POA: Insufficient documentation

## 2015-01-19 DIAGNOSIS — J449 Chronic obstructive pulmonary disease, unspecified: Secondary | ICD-10-CM | POA: Insufficient documentation

## 2015-01-19 DIAGNOSIS — Z7901 Long term (current) use of anticoagulants: Secondary | ICD-10-CM | POA: Insufficient documentation

## 2015-01-19 DIAGNOSIS — K219 Gastro-esophageal reflux disease without esophagitis: Secondary | ICD-10-CM | POA: Insufficient documentation

## 2015-01-19 DIAGNOSIS — R509 Fever, unspecified: Secondary | ICD-10-CM

## 2015-01-19 DIAGNOSIS — M109 Gout, unspecified: Principal | ICD-10-CM | POA: Insufficient documentation

## 2015-01-19 DIAGNOSIS — E876 Hypokalemia: Secondary | ICD-10-CM | POA: Diagnosis not present

## 2015-01-19 DIAGNOSIS — I10 Essential (primary) hypertension: Secondary | ICD-10-CM | POA: Diagnosis not present

## 2015-01-19 LAB — CBC WITH DIFFERENTIAL/PLATELET
BASOS PCT: 1 %
Basophils Absolute: 0 10*3/uL (ref 0.0–0.1)
EOS ABS: 0.3 10*3/uL (ref 0.0–0.7)
Eosinophils Relative: 4 %
HEMATOCRIT: 28 % — AB (ref 36.0–46.0)
Hemoglobin: 9.3 g/dL — ABNORMAL LOW (ref 12.0–15.0)
Lymphocytes Relative: 34 %
Lymphs Abs: 2.9 10*3/uL (ref 0.7–4.0)
MCH: 28.4 pg (ref 26.0–34.0)
MCHC: 33.2 g/dL (ref 30.0–36.0)
MCV: 85.4 fL (ref 78.0–100.0)
MONO ABS: 0.9 10*3/uL (ref 0.1–1.0)
MONOS PCT: 10 %
Neutro Abs: 4.4 10*3/uL (ref 1.7–7.7)
Neutrophils Relative %: 51 %
Platelets: 291 10*3/uL (ref 150–400)
RBC: 3.28 MIL/uL — ABNORMAL LOW (ref 3.87–5.11)
RDW: 13.8 % (ref 11.5–15.5)
WBC: 8.6 10*3/uL (ref 4.0–10.5)

## 2015-01-19 LAB — BASIC METABOLIC PANEL
Anion gap: 10 (ref 5–15)
CALCIUM: 7.6 mg/dL — AB (ref 8.9–10.3)
CO2: 25 mmol/L (ref 22–32)
CREATININE: 0.87 mg/dL (ref 0.44–1.00)
Chloride: 97 mmol/L — ABNORMAL LOW (ref 101–111)
GFR calc non Af Amer: 59 mL/min — ABNORMAL LOW (ref >60)
Glucose, Bld: 115 mg/dL — ABNORMAL HIGH (ref 65–99)
Potassium: 2.7 mmol/L — CL (ref 3.5–5.1)
SODIUM: 132 mmol/L — AB (ref 135–145)

## 2015-01-19 LAB — PROTIME-INR
INR: 1.93 — AB (ref 0.00–1.49)
Prothrombin Time: 22 seconds — ABNORMAL HIGH (ref 11.6–15.2)

## 2015-01-19 MED ORDER — POTASSIUM CHLORIDE 10 MEQ/100ML IV SOLN
10.0000 meq | Freq: Once | INTRAVENOUS | Status: AC
  Administered 2015-01-20: 10 meq via INTRAVENOUS
  Filled 2015-01-19: qty 100

## 2015-01-19 MED ORDER — POTASSIUM CHLORIDE CRYS ER 20 MEQ PO TBCR
40.0000 meq | EXTENDED_RELEASE_TABLET | Freq: Once | ORAL | Status: AC
  Administered 2015-01-20: 40 meq via ORAL
  Filled 2015-01-19: qty 2

## 2015-01-19 MED ORDER — MAGNESIUM SULFATE 2 GM/50ML IV SOLN
2.0000 g | Freq: Once | INTRAVENOUS | Status: DC
  Administered 2015-01-20: 2 g via INTRAVENOUS
  Filled 2015-01-19: qty 50

## 2015-01-19 NOTE — ED Provider Notes (Signed)
CSN: [id number]     Arrival date & time 01/19/15  2125 History   First MD Initiated Contact with Patient 01/19/15 2154     Chief Complaint  Patient presents with  . Fever  . Urine Output    decreased per pt report  . Foot Pain    bilat     (Consider location/radiation/quality/duration/timing/severity/associated sxs/prior Treatment) Patient is a 81 y.o. female presenting with general illness.  Illness Location:  Generalized Quality:  Confusion, weakness, fever (tmax 101.8) Severity:  Moderate Onset quality:  Gradual Duration:  1 day Timing:  Constant Progression:  Resolved Chronicity:  Recurrent Context:  Admitted and dc recently for metabolic encephalopathy from PNA, sent home on augmentin   Past Medical History  Diagnosis Date  . HYPERLIPIDEMIA 01/06/2007  . HYPERTENSION 01/06/2007  . COPD 01/06/2007  . ANXIETY 11/21/2008  . SLEEP APNEA 11/21/2008  . DIVERTICULITIS OF COLON 07/23/2009  . Fistula   . GERD 01/06/2007  . PAD (peripheral artery disease) (HCC)   . CAD (coronary artery disease)     Mild by cath in remote past  . Atrial fibrillation (HCC)   . CHF (congestive heart failure) [facility])    Past Surgical History  Procedure Laterality Date  . Temporal artery biopsy / ligation    . Cataract extraction    . Cholecystectomy    . Abdominal hysterectomy    . Appendectomy    . Partial colectomy      diverticulitis  . Colovaginal fistula repair      after partial colectomy  . Tubal ligation    . Gallbladder surgery    . Tonsillectomy  81 YEARS OLD   Family History  Problem Relation Age of Onset  . Heart disease Father   . Asthma Father   . Diabetes Daughter   . Colon cancer Son   . Cancer Mother    Social History  Substance Use Topics  . Smoking status: Current Every Day Smoker -- 1.50 packs/day    Types: Cigarettes  . Smokeless tobacco: None  . Alcohol Use: No   OB History    No data available     Review of Systems    Allergies  Hydroxyzine  hcl  Home Medications   Prior to Admission medications   Medication Sig Start Date End Date Taking? Authorizing Provider  acetaminophen (TYLENOL) 325 MG tablet Take 2 tablets (650 mg total) by mouth every 6 (six) hours as needed for mild pain (headache). 01/15/15   Enid Baas, MD  alendronate (FOSAMAX) 70 MG tablet Take 70 mg by mouth once a week. Take with a full glass of water on an empty stomach.    Historical Provider, MD  amoxicillin-clavulanate (AUGMENTIN) 875-125 MG tablet Take 1 tablet by mouth every 12 (twelve) hours. 01/15/15 01/21/15  Enid Baas, MD  diltiazem (CARDIZEM CD) 180 MG 24 hr capsule Take 1 capsule (180 mg total) by mouth daily. 01/15/15   Enid Baas, MD  fluticasone (FLONASE) 50 MCG/ACT nasal spray USE 2 SPRAYS IN NOSE DAILY AS DIRECTED 08/12/11   Lindley Magnus, MD  gabapentin (NEURONTIN) 100 MG capsule Take 100 mg by mouth 3 (three) times daily.    Historical Provider, MD  nystatin cream (MYCOSTATIN) Apply 1 application topically 2 (two) times daily.    Historical Provider, MD  omeprazole (PRILOSEC) 40 MG capsule Take 40 mg by mouth daily.    Historical Provider, MD  ondansetron (ZOFRAN) 4 MG tablet Take 1 tablet (4 mg total) by mouth every  6 (six) hours. As needed for nausea or vomiting 02/03/12   John-Adam Bonk, MD  polyethylene glycol (MIRALAX / GLYCOLAX) packet Take 17 g by mouth daily as needed for mild constipation.     Historical Provider, MD  traMADol (ULTRAM) 50 MG tablet Take 50 mg by mouth every 6 (six) hours as needed for moderate pain.     Historical Provider, MD  vitamin B-12 (CYANOCOBALAMIN) 100 MCG tablet Take 1 tablet (100 mcg total) by mouth daily. 01/15/15   Enid Baasadhika Kalisetti, MD  Vitamin D, Ergocalciferol, (DRISDOL) 50000 UNITS CAPS Take 50,000 Units by mouth every 30 (thirty) days.    Historical Provider, MD  warfarin (COUMADIN) 1 MG tablet Take 1.5-2 mg by mouth daily. Take 1mg  by mouth on three days per week and take 1.5mg  by mouth  on mondays, wednesdays, fridays, saturdays    Historical Provider, MD   BP 120/52 mmHg  Pulse 64  Temp(Src) 98.8 F (37.1 C) (Oral)  Resp 18  Ht 5\' 3"  (1.6 m)  Wt 129 lb (58.514 kg)  BMI 22.86 kg/m2  SpO2 94% Physical Exam  Constitutional: She is oriented to person, place, and time. She appears well-developed and well-nourished.  HENT:  Head: Normocephalic and atraumatic.  Right Ear: External ear normal.  Left Ear: External ear normal.  Eyes: Conjunctivae and EOM are normal. Pupils are equal, round, and reactive to light.  Neck: Normal range of motion. Neck supple.  Cardiovascular: Normal rate, regular rhythm, normal heart sounds and intact distal pulses.   Pulmonary/Chest: Effort normal and breath sounds normal.  Abdominal: Soft. Bowel sounds are normal. There is no tenderness.  Musculoskeletal: Normal range of motion.  bil tenderness of ankles with FROM, mild warmth on L, pulses 2+  Neurological: She is alert and oriented to person, place, and time.  Skin: Skin is warm and dry.  Vitals reviewed.   ED Course  Procedures (including critical care time) Labs Review Labs Reviewed - No data to display  Imaging Review No results found. I have personally reviewed and evaluated these images and lab results as part of my medical decision-making.   EKG Interpretation None     CRITICAL CARE Performed by: Mirian MoMatthew Andrena Margerum   Total critical care time: 35 min  Critical care time was exclusive of separately billable procedures and treating other patients.  Critical care was necessary to treat or prevent imminent or life-threatening deterioration.  Critical care was time spent personally by me on the following activities: development of treatment plan with patient and/or surrogate as well as nursing, discussions with consultants, evaluation of patient's response to treatment, examination of patient, obtaining history from patient or surrogate, ordering and performing treatments and  interventions, ordering and review of laboratory studies, ordering and review of radiographic studies, pulse oximetry and re-evaluation of patient's condition.  MDM   Final diagnoses:  None    81 y.o. female with pertinent PMH of recent admission for metabolic encephalopathy secondary to PNA presents with recurrent generalized weakness, fever, confusion. On arrival today vitals signs and physical exam as above. Patient is oriented 3. Her exam is relatively benign. Workup demonstrated significant hypokalemia. Consulted medicine for admission..    I have reviewed all laboratory and imaging studies if ordered as above  1. Hypokalemia         Mirian MoMatthew Arian Mcquitty, MD 01/20/15 [phone]

## 2015-01-19 NOTE — ED Notes (Signed)
Pt presents with [facility] from home for multiple complaints including fever x 2 days; bilat foot pain x "a pretty good while"- cannot bear weight; decreased urinary output per pt report; pressure ulcer vs rash to sacrum and perineum; crackles in the bilat lung bases per EMS assessment- pt denies SOB at this time; CAOx4; VSS

## 2015-01-20 ENCOUNTER — Encounter (HOSPITAL_COMMUNITY): Payer: Self-pay | Admitting: Internal Medicine

## 2015-01-20 DIAGNOSIS — I10 Essential (primary) hypertension: Secondary | ICD-10-CM

## 2015-01-20 DIAGNOSIS — I2510 Atherosclerotic heart disease of native coronary artery without angina pectoris: Secondary | ICD-10-CM

## 2015-01-20 DIAGNOSIS — I4891 Unspecified atrial fibrillation: Secondary | ICD-10-CM | POA: Diagnosis not present

## 2015-01-20 DIAGNOSIS — R509 Fever, unspecified: Secondary | ICD-10-CM | POA: Diagnosis not present

## 2015-01-20 DIAGNOSIS — E876 Hypokalemia: Secondary | ICD-10-CM

## 2015-01-20 LAB — COMPREHENSIVE METABOLIC PANEL
ALK PHOS: 51 U/L (ref 38–126)
ALT: 17 U/L (ref 14–54)
AST: 20 U/L (ref 15–41)
Albumin: 2.6 g/dL — ABNORMAL LOW (ref 3.5–5.0)
Anion gap: 9 (ref 5–15)
CALCIUM: 7.6 mg/dL — AB (ref 8.9–10.3)
CO2: 25 mmol/L (ref 22–32)
CREATININE: 0.81 mg/dL (ref 0.44–1.00)
Chloride: 98 mmol/L — ABNORMAL LOW (ref 101–111)
GFR calc Af Amer: >60 mL/min (ref >60)
GFR calc non Af Amer: >60 mL/min (ref >60)
Glucose, Bld: 108 mg/dL — ABNORMAL HIGH (ref 65–99)
Potassium: 3.4 mmol/L — ABNORMAL LOW (ref 3.5–5.1)
SODIUM: 132 mmol/L — AB (ref 135–145)
Total Bilirubin: 0.7 mg/dL (ref 0.3–1.2)
Total Protein: 6.2 g/dL — ABNORMAL LOW (ref 6.5–8.1)

## 2015-01-20 LAB — CBC
HCT: 29.3 % — ABNORMAL LOW (ref 36.0–46.0)
HEMOGLOBIN: 9.4 g/dL — AB (ref 12.0–15.0)
MCH: 27.2 pg (ref 26.0–34.0)
MCHC: 32.1 g/dL (ref 30.0–36.0)
MCV: 84.9 fL (ref 78.0–100.0)
Platelets: 298 10*3/uL (ref 150–400)
RBC: 3.45 MIL/uL — ABNORMAL LOW (ref 3.87–5.11)
RDW: 13.9 % (ref 11.5–15.5)
WBC: 8.1 10*3/uL (ref 4.0–10.5)

## 2015-01-20 LAB — URINALYSIS, ROUTINE W REFLEX MICROSCOPIC
Bilirubin Urine: NEGATIVE
Glucose, UA: NEGATIVE mg/dL
Hgb urine dipstick: NEGATIVE
KETONES UR: NEGATIVE mg/dL
LEUKOCYTES UA: NEGATIVE
NITRITE: NEGATIVE
PROTEIN: NEGATIVE mg/dL
Specific Gravity, Urine: 1.015 (ref 1.005–1.030)
UROBILINOGEN UA: 0.2 mg/dL (ref 0.0–1.0)
pH: 5.5 (ref 5.0–8.0)

## 2015-01-20 LAB — URIC ACID: Uric Acid, Serum: 6.7 mg/dL — ABNORMAL HIGH (ref 2.3–6.6)

## 2015-01-20 LAB — PROTIME-INR
INR: 1.95 — ABNORMAL HIGH (ref 0.00–1.49)
PROTHROMBIN TIME: 22.1 s — AB (ref 11.6–15.2)

## 2015-01-20 LAB — GLUCOSE, CAPILLARY: Glucose-Capillary: 99 mg/dL (ref 65–99)

## 2015-01-20 LAB — SEDIMENTATION RATE: SED RATE: 109 mm/h — AB (ref 0–22)

## 2015-01-20 MED ORDER — WARFARIN - PHARMACIST DOSING INPATIENT
Freq: Every day | Status: DC
  Administered 2015-01-20: 1

## 2015-01-20 MED ORDER — FLUCONAZOLE 50 MG PO TABS
50.0000 mg | ORAL_TABLET | Freq: Every day | ORAL | Status: DC
  Administered 2015-01-20 – 2015-01-21 (×2): 50 mg via ORAL
  Filled 2015-01-20 (×3): qty 1

## 2015-01-20 MED ORDER — NYSTATIN 100000 UNIT/GM EX CREA
1.0000 "application " | TOPICAL_CREAM | Freq: Two times a day (BID) | CUTANEOUS | Status: DC
  Administered 2015-01-20 – 2015-01-21 (×3): 1 via TOPICAL
  Filled 2015-01-20: qty 15

## 2015-01-20 MED ORDER — ENOXAPARIN SODIUM 40 MG/0.4ML ~~LOC~~ SOLN
40.0000 mg | Freq: Every day | SUBCUTANEOUS | Status: DC
  Administered 2015-01-20: 40 mg via SUBCUTANEOUS
  Filled 2015-01-20: qty 0.4

## 2015-01-20 MED ORDER — ONDANSETRON HCL 4 MG PO TABS: 4.0000 mg | ORAL_TABLET | Freq: Four times a day (QID) | ORAL | Status: DC | PRN

## 2015-01-20 MED ORDER — METHYLPREDNISOLONE SODIUM SUCC 125 MG IJ SOLR
60.0000 mg | Freq: Once | INTRAMUSCULAR | Status: AC
  Administered 2015-01-20: 60 mg via INTRAVENOUS
  Filled 2015-01-20: qty 2

## 2015-01-20 MED ORDER — ALUM & MAG HYDROXIDE-SIMETH 200-200-20 MG/5ML PO SUSP: 30.0000 mL | Freq: Four times a day (QID) | ORAL | Status: DC | PRN

## 2015-01-20 MED ORDER — HYDROCODONE-ACETAMINOPHEN 5-325 MG PO TABS
1.0000 | ORAL_TABLET | ORAL | Status: DC | PRN
  Administered 2015-01-20: 1 via ORAL
  Filled 2015-01-20: qty 1

## 2015-01-20 MED ORDER — WARFARIN SODIUM 1 MG PO TABS
1.0000 mg | ORAL_TABLET | Freq: Once | ORAL | Status: AC
  Administered 2015-01-20: 1 mg via ORAL
  Filled 2015-01-20: qty 1

## 2015-01-20 MED ORDER — SODIUM CHLORIDE 0.9 % IJ SOLN
3.0000 mL | Freq: Two times a day (BID) | INTRAMUSCULAR | Status: DC
  Administered 2015-01-20 – 2015-01-21 (×3): 3 mL via INTRAVENOUS

## 2015-01-20 MED ORDER — ALENDRONATE SODIUM 70 MG PO TABS: 70.0000 mg | ORAL_TABLET | ORAL | Status: DC

## 2015-01-20 MED ORDER — DOCUSATE SODIUM 100 MG PO CAPS
100.0000 mg | ORAL_CAPSULE | Freq: Two times a day (BID) | ORAL | Status: DC
  Administered 2015-01-20 – 2015-01-21 (×2): 100 mg via ORAL
  Filled 2015-01-20 (×3): qty 1

## 2015-01-20 MED ORDER — FLUTICASONE PROPIONATE 50 MCG/ACT NA SUSP
2.0000 | Freq: Every day | NASAL | Status: DC
  Administered 2015-01-20 – 2015-01-21 (×2): 2 via NASAL
  Filled 2015-01-20: qty 16

## 2015-01-20 MED ORDER — TRAMADOL HCL 50 MG PO TABS: 50.0000 mg | ORAL_TABLET | Freq: Four times a day (QID) | ORAL | Status: DC | PRN

## 2015-01-20 MED ORDER — PANTOPRAZOLE SODIUM 40 MG PO TBEC
80.0000 mg | DELAYED_RELEASE_TABLET | Freq: Every day | ORAL | Status: DC
  Administered 2015-01-20 – 2015-01-21 (×2): 80 mg via ORAL
  Filled 2015-01-20 (×3): qty 2

## 2015-01-20 MED ORDER — ACETAMINOPHEN 325 MG PO TABS: 650.0000 mg | ORAL_TABLET | Freq: Four times a day (QID) | ORAL | Status: DC | PRN

## 2015-01-20 NOTE — Progress Notes (Signed)
ANTICOAGULATION CONSULT NOTE - Initial Consult  Pharmacy Consult for Coumadin Indication: atrial fibrillation  Allergies  Allergen Reactions  . Hydroxyzine Hcl Other (See Comments)    Makes patient feel like she does not want to live    Patient Measurements: Height:  (160 cm) Weight: 129 lb (58.514 kg) (pts estimation) IBW/kg (Calculated) : 52.4  Vital Signs: Temp: 98.8 F (37.1 C) (10/15 2139) Temp Source: Oral (10/15 2139) BP: 125/48 mmHg (10/15 2330) Pulse Rate: 70 (10/15 2330)  Labs:  Recent Labs  01/19/15 2302  HGB 9.3*  HCT 28.0*  PLT 291  LABPROT 22.0*  INR 1.93*  CREATININE 0.87    Estimated Creatinine Clearance: 39.8 mL/min (by C-G formula based on Cr of 0.87).   Medical History: Past Medical History  Diagnosis Date  . HYPERLIPIDEMIA 01/06/2007  . HYPERTENSION 01/06/2007  . COPD 01/06/2007  . ANXIETY 11/21/2008  . SLEEP APNEA 11/21/2008  . DIVERTICULITIS OF COLON 07/23/2009  . Fistula   . GERD 01/06/2007  . PAD (peripheral artery disease) (HCC)   . CAD (coronary artery disease)     Mild by cath in remote past  . Atrial fibrillation (HCC)   . CHF (congestive heart failure) (HCC)     Medications:  Prescriptions prior to admission  Medication Sig Dispense Refill Last Dose  . acetaminophen (TYLENOL) 325 MG tablet Take 2 tablets (650 mg total) by mouth every 6 (six) hours as needed for mild pain (headache). 30 tablet 0 01/18/2015 at Unknown time  . alendronate (FOSAMAX) 70 MG tablet Take 70 mg by mouth once a week. Take with a full glass of water on an empty stomach.   01/15/2015  . amoxicillin-clavulanate (AUGMENTIN) 875-125 MG tablet Take 1 tablet by mouth every 12 (twelve) hours. 12 tablet 0 01/19/2015 at Unknown time  . diltiazem (CARDIZEM CD) 180 MG 24 hr capsule Take 1 capsule (180 mg total) by mouth daily. 30 capsule 1 01/19/2015 at Unknown time  . fluticasone (FLONASE) 50 MCG/ACT nasal spray USE 2 SPRAYS IN NOSE DAILY AS DIRECTED 16 g 5 unk   . gabapentin (NEURONTIN) 100 MG capsule Take 100 mg by mouth 3 (three) times daily.   01/19/2015 at Unknown time  . mirtazapine (REMERON) 30 MG tablet Take 30 mg by mouth at bedtime.   01/18/2015 at Unknown time  . nystatin cream (MYCOSTATIN) Apply 1 application topically 2 (two) times daily.   01/19/2015 at Unknown time  . omeprazole (PRILOSEC) 40 MG capsule Take 40 mg by mouth daily.   01/19/2015 at Unknown time  . ondansetron (ZOFRAN) 4 MG tablet Take 1 tablet (4 mg total) by mouth every 6 (six) hours. As needed for nausea or vomiting 12 tablet 0 unk  . polyethylene glycol (MIRALAX / GLYCOLAX) packet Take 17 g by mouth daily as needed for mild constipation.    unk  . traMADol (ULTRAM) 50 MG tablet Take 50 mg by mouth every 6 (six) hours as needed for moderate pain.    unk  . vitamin B-12 (CYANOCOBALAMIN) 100 MCG tablet Take 1 tablet (100 mcg total) by mouth daily. 30 tablet 0 01/19/2015 at Unknown time  . Vitamin D, Ergocalciferol, (DRISDOL) 50000 UNITS CAPS Take 50,000 Units by mouth every 30 (thirty) days.   01/05/2015  . warfarin (COUMADIN) 1 MG tablet Take 1.5-2 mg by mouth daily. 1.5mg  by mouth on mondays.  All other days of the week take  tablet.   01/18/2015 at Unknown time    Assessment: 81 y.o. female  admitted with AMS/fever, h/o Afib, to continue Coumadin  Goal of Therapy:  INR 2-3 Monitor platelets by anticoagulation protocol: Yes   Plan:  F/U daily INR D/C DVT prophylaxis Lovenox once INR > 2   Tanda Morrissey, Gary FleetGregory Vernon 01/20/2015,1:32 AM

## 2015-01-20 NOTE — Evaluation (Signed)
Physical Therapy Evaluation Patient Details Name: [name] Cunningham MRN: [id number] DOB: 1928-08-10 Today's Date: 01/20/2015   History of Present Illness  81yo woman who lives at home with daughter and has PMH of HLD, HTN, COPD, anxiety, GERD, CAD, Afib, osteoporosis. She presented with fevers after recent discharge from [facility] where she was treated for sepsis from a LLL pneumonia.  Clinical Impression   Pt admitted with above diagnosis. Pt currently with functional limitations due to the deficits listed below (see PT Problem List).  Pt will benefit from skilled PT to increase their independence and safety with mobility to allow discharge to the venue listed below.       Follow Up Recommendations Home health PT;Supervision/Assistance - 24 hour    Equipment Recommendations  Wheelchair (measurements PT);Wheelchair cushion (measurements PT);Other (comment) (Tub transfer bench)    Recommendations for Other Services OT consult     Precautions / Restrictions Precautions Precautions: Fall      Mobility  Bed Mobility Overal bed mobility: Needs Assistance Bed Mobility: Supine to Sit     Supine to sit: Mod assist     General bed mobility comments: Pt difficult to wake up; Heavy mod assist to help her to EOB in an effort to get her alert  Transfers Overall transfer level: Needs assistance Equipment used: Rolling walker (2 wheeled) Transfers: Sit to/from [employer] Sit to Stand: Mod assist Stand pivot transfers: Mod assist       General transfer comment: Mod assist to power up; multimodal cues to assist her to the [facility]; had some urine leakage prior to getting on teh commode; performed pivot stpes BSC to recliner with RW; cues to keep a hold of RW for safety, she tended to reach for recliner armrests prematurely  Ambulation/Gait             General Gait Details: pivot shuffling steps BSC to recliner  Stairs            Wheelchair Mobility     Modified Rankin (Stroke Patients Only)       Balance Overall balance assessment: Needs assistance           Standing balance-Leahy Scale: Poor                               Pertinent Vitals/Pain Pain Assessment: No/denies pain    Home Living Family/patient expects to be discharged to:: Private residence Living Arrangements: Children Available Help at Discharge: Family Type of Home: Apartment Home Access: Level entry     Home Layout: One level Home Equipment: Environmental consultant - 4 wheels;Cane - single point;Bedside commode      Prior Function Level of Independence: Independent with assistive device(s) (prior to recent admission at [facility] 10/9)         Comments: Pt has been working with HHPT recently     Hand Dominance        Extremity/Trunk Assessment   Upper Extremity Assessment: Overall WFL for tasks assessed           Lower Extremity Assessment: Generalized weakness         Communication   Communication: No difficulties  Cognition Arousal/Alertness: Awake/alert Behavior During Therapy: WFL for tasks assessed/performed Overall Cognitive Status: Within Functional Limits for tasks assessed (has minimal dementia issues at baseline, appropriate with PT)                      General  Comments General comments (skin integrity, edema, etc.): Pt's daughter requested that I include in this note that prior to her [facility], she did not have the sores on her bottom    Exercises        Assessment/Plan    PT Assessment Patient needs continued PT services  PT Diagnosis Difficulty walking;Generalized weakness   PT Problem List Decreased activity tolerance;Decreased balance;Decreased mobility;Decreased safety awareness  PT Treatment Interventions DME instruction;Gait training;Functional mobility training;Therapeutic activities;Therapeutic exercise;Cognitive remediation;Wheelchair mobility training;Patient/family education   PT  Goals (Current goals can be found in the Care Plan section) Acute Rehab PT Goals Patient Stated Goal: I want to go home PT Goal Formulation: With patient/family Time For Goal Achievement: 02/03/15 Potential to Achieve Goals: Good    Frequency Min 3X/week   Barriers to discharge        Co-evaluation               End of Session Equipment Utilized During Treatment: Gait belt Activity Tolerance: Patient tolerated treatment well Patient left: in chair;with call bell/phone within reach;with family/visitor present Nurse Communication: Mobility status    Functional Assessment Tool Used: Clinical Judgement Functional Limitation: Mobility: Walking and moving around Mobility: Walking and Moving Around Current Status [phone]([id number]): At least 40 percent but less than 60 percent impaired, limited or restricted Mobility: Walking and Moving Around Goal Status [phone](G8979): At least 1 percent but less than 20 percent impaired, limited or restricted    Time: [id number] PT Time Calculation (min) (ACUTE ONLY): 32 min   Charges:   PT Evaluation $Initial PT Evaluation Tier I: 1 Procedure PT Treatments $Therapeutic Activity: 8-22 mins   PT G Codes:   PT G-Codes **NOT FOR INPATIENT CLASS** Functional Assessment Tool Used: Clinical Judgement Functional Limitation: Mobility: Walking and moving around Mobility: Walking and Moving Around Current Status ([phone]): At least 40 percent but less than 60 percent impaired, limited or restricted Mobility: Walking and Moving Around Goal Status [phone](G8979): At least 1 percent but less than 20 percent impaired, limited or restricted    [name] Cunningham 01/20/2015, 6:17 PM  [name] Cunningham, PT  Acute Rehabilitation Services Pager [phone] Office [phone]

## 2015-01-20 NOTE — Progress Notes (Signed)
ANTICOAGULATION CONSULT NOTE Pharmacy Consult for Coumadin Indication: atrial fibrillation  Allergies  Allergen Reactions  . Hydroxyzine Hcl Other (See Comments)    Makes patient feel like she does not want to live    Patient Measurements: Height:  (160 cm) Weight: 147 lb 7.8 oz (66.9 kg) (bedscale ) IBW/kg (Calculated) : 52.4  Vital Signs: Temp: 98.2 F (36.8 C) (10/16 0530) Temp Source: Oral (10/16 0530) BP: 131/47 mmHg (10/16 0948) Pulse Rate: 63 (10/16 0948)  Labs:  Recent Labs  01/19/15 2302 01/20/15 0548  HGB 9.3* 9.4*  HCT 28.0* 29.3*  PLT 291 298  LABPROT 22.0* 22.1*  INR 1.93* 1.95*  CREATININE 0.87 0.81    Estimated Creatinine Clearance: 47.5 mL/min (by C-G formula based on Cr of 0.81).   Medical History: Past Medical History  Diagnosis Date  . HYPERLIPIDEMIA 01/06/2007  . HYPERTENSION 01/06/2007  . COPD 01/06/2007  . ANXIETY 11/21/2008  . SLEEP APNEA 11/21/2008  . DIVERTICULITIS OF COLON 07/23/2009  . Fistula   . GERD 01/06/2007  . PAD (peripheral artery disease) (HCC)   . CAD (coronary artery disease)     Mild by cath in remote past  . Atrial fibrillation (HCC)   . CHF (congestive heart failure) (HCC)     Medications:  Prescriptions prior to admission  Medication Sig Dispense Refill Last Dose  . acetaminophen (TYLENOL) 325 MG tablet Take 2 tablets (650 mg total) by mouth every 6 (six) hours as needed for mild pain (headache). 30 tablet 0 01/18/2015 at Unknown time  . alendronate (FOSAMAX) 70 MG tablet Take 70 mg by mouth once a week. Take with a full glass of water on an empty stomach.   01/15/2015  . amoxicillin-clavulanate (AUGMENTIN) 875-125 MG tablet Take 1 tablet by mouth every 12 (twelve) hours. 12 tablet 0 01/19/2015 at Unknown time  . diltiazem (CARDIZEM CD) 180 MG 24 hr capsule Take 1 capsule (180 mg total) by mouth daily. 30 capsule 1 01/19/2015 at Unknown time  . fluticasone (FLONASE) 50 MCG/ACT nasal spray USE 2 SPRAYS IN NOSE  DAILY AS DIRECTED 16 g 5 unk  . gabapentin (NEURONTIN) 100 MG capsule Take 100 mg by mouth 3 (three) times daily.   01/19/2015 at Unknown time  . mirtazapine (REMERON) 30 MG tablet Take 30 mg by mouth at bedtime.   01/18/2015 at Unknown time  . nystatin cream (MYCOSTATIN) Apply 1 application topically 2 (two) times daily.   01/19/2015 at Unknown time  . omeprazole (PRILOSEC) 40 MG capsule Take 40 mg by mouth daily.   01/19/2015 at Unknown time  . ondansetron (ZOFRAN) 4 MG tablet Take 1 tablet (4 mg total) by mouth every 6 (six) hours. As needed for nausea or vomiting 12 tablet 0 unk  . polyethylene glycol (MIRALAX / GLYCOLAX) packet Take 17 g by mouth daily as needed for mild constipation.    unk  . traMADol (ULTRAM) 50 MG tablet Take 50 mg by mouth every 6 (six) hours as needed for moderate pain.    unk  . vitamin B-12 (CYANOCOBALAMIN) 100 MCG tablet Take 1 tablet (100 mcg total) by mouth daily. 30 tablet 0 01/19/2015 at Unknown time  . Vitamin D, Ergocalciferol, (DRISDOL) 50000 UNITS CAPS Take 50,000 Units by mouth every 30 (thirty) days.   01/05/2015  . warfarin (COUMADIN) 1 MG tablet Take 1.5-2 mg by mouth daily. 1.5mg  by mouth on mondays.  All other days of the week take  tablet.   01/18/2015 at Unknown time  Assessment: 81 y.o. female admitted with AMS/fever, h/o Afib, to continue Coumadin.  Warfarin dose: 1.5 mg po qMon, 1 mg all other days   Goal of Therapy:  INR 2-3 Monitor platelets by anticoagulation protocol: Yes   Plan:  -Warfarin 1 mg po x1 -Daily INR    Arney Mayabb, Darl HouseholderAlison M 01/20/2015,1:21 PM

## 2015-01-20 NOTE — H&P (Addendum)
[facility] History and Physical  Almon RegisterMargaret T Garceau ZOX:[id number] DOB: 09/28/1928 DOA: 01/19/2015  Referring physician: ED physician PCP: No primary care provider on file.   Chief Complaint: fever  HPI:  [name] is an 81yo woman who lives at home with daughter and has PMH of HLD, HTN, COPD, anxiety, GERD, CAD, Afib, osteoporosis.  She presented today as she had a fever at home.  [name] was recently admitted to [facility] where she was treated for sepsis from a LLL pneumonia an dwas sent home on Augmentin on 01/15/15.  She was due to complete the augmentin today.  She had an RN come visit her in the morning, however, in the afternoon she developed a temperature to 101.87F and became confused, thinking it was bedtime at 6pm.  Her son was in room with her and he reports that she has been more weak since leaving the hospital.  She has a fungal rash on her bottom which has been treated with a cream and also has a bad tooth in her mouth, however, she reports this is not causing her any trouble.  She further has gouty pain in her feet, left worse than right.  Further symptoms include decreased urination today.  Most of this history is relayed by the son as the patient has very few complaints except pain in her foot.  She was afebrile in the ED.  Blood pressure was soft with MAPs low 50s.  The family is very concerned that she has a persistent infection.  She was found to have low K in the ED.  CXR showed no active disease.  Her son notes she may not have been eating very well in the last week.  Assessment and Plan:  Fever - Reported at home - Hold Abx and monitor for another fever - CXR appeared to show no PNA - UA was normal - BC and UC ordered, UC currently pending - Will start diflucan for severe rash on buttocks which does appear fungal - Consider wound care - If recurrent fever, start broad spectrum Abx considering most recently in the hospital < 1 week ago - She has no diarrhea to  prompt a Cdiff work up at this time.   Hypokalemia - Repleted with oral and IV routes in ED - Repeat BMET in the AM - Unclear cause, except maybe decreased PO intake - She has no diarrhea  Essential hypertension - BP mildly soft this hospital stay - Holding sedating medications including gabapentin and remeron - Hold BP meds tonight, including diltiazem and monitor as noted above    Coronary atherosclerosis - She has no complaint of chest pain - RBBB on EKG noted    Atrial fibrillation - She is on diltiazem, being held for now - Monitor on telemetry - Warfarin per pharmacy consult   Gout and foot pain - Tramadol - PT/OT - She has a mention of PMR in her chart, however, she was not aware and nor was her son - Discuss further with family, steroids may be appropriate.   Chronic anemia - No report of blood loss - Trend CBC  Diet: Heart health  DVT PPx: on coumadin       Radiological Exams on Admission: Dg Chest 2 View  01/19/2015  CLINICAL DATA:  Multiple complaints including fever for 2 days, bilateral foot pain for a good while. Unable to bear weight. Decreased urinary output. Pressure ulcer versus rash to sacrum and perineum. Crackles in the lung bases. EXAM: CHEST  2 VIEW COMPARISON:  01/10/2015 [facility] FINDINGS: Segmental elevation of the right hemidiaphragm. Cardiac enlargement. No pulmonary vascular congestion. Calcification in the mitral valve annulus. Calcified and tortuous aorta. No focal airspace disease or consolidation in the lungs. Degenerative changes in the spine. Degenerative changes in the shoulders. No blunting of costophrenic angles. No pneumothorax. IMPRESSION: Cardiac enlargement.  No evidence of active pulmonary disease. Electronically Signed   By: Burman Nieves M.D.   On: 01/19/2015 23:22   Code Status: Full Family Communication: Pt at bedside Disposition Plan: Admit for further evaluation    Debe Coder,  MD [phone]   Review of Systems:  Constitutional: + fever, chills malaise/fatigue. Negative for diaphoresis.  HENT: Negative for hearing loss, ear pain Eyes: Negative for blurred vision, double vision Respiratory: Negative for cough, sputum production, shortness of breath  Cardiovascular: Negative for chest pain, palpitations, orthopnea Gastrointestinal: Negative for nausea, vomiting and abdominal pain Genitourinary: Negative for dysuria, urgency, frequency Musculoskeletal: + for ankle, foot pain, worse on the left Negative for myalgias, back pain, joint pain and falls.  Skin: Negative for itching and rash.  Neurological: Negative for dizziness and weakness.  Psychiatric/Behavioral: The patient is not nervous/anxious.  + for some confusion    Past Medical History  Diagnosis Date  . HYPERLIPIDEMIA 01/06/2007  . HYPERTENSION 01/06/2007  . COPD 01/06/2007  . ANXIETY 11/21/2008  . SLEEP APNEA 11/21/2008  . DIVERTICULITIS OF COLON 07/23/2009  . Fistula   . GERD 01/06/2007  . PAD (peripheral artery disease) (HCC)   . CAD (coronary artery disease)     Mild by cath in remote past  . Atrial fibrillation (HCC)   . CHF (congestive heart failure) [facility])     Past Surgical History  Procedure Laterality Date  . Temporal artery biopsy / ligation    . Cataract extraction    . Cholecystectomy    . Abdominal hysterectomy    . Appendectomy    . Partial colectomy      diverticulitis  . Colovaginal fistula repair      after partial colectomy  . Tubal ligation    . Gallbladder surgery    . Tonsillectomy  81 YEARS OLD    Social History:  reports that she has quit smoking. Her smoking use included Cigarettes. She does not have any smokeless tobacco history on file. She reports that she does not drink alcohol or use illicit drugs.  Allergies  Allergen Reactions  . Hydroxyzine Hcl Other (See Comments)    Makes patient feel like she does not want to live    Family History  Problem Relation Age  of Onset  . Heart disease Father   . Asthma Father   . Diabetes Daughter   . Colon cancer Son   . Cancer Mother     Prior to Admission medications   Medication Sig Start Date End Date Taking? Authorizing Provider  acetaminophen (TYLENOL) 325 MG tablet Take 2 tablets (650 mg total) by mouth every 6 (six) hours as needed for mild pain (headache). 01/15/15  Yes Enid Baas, MD  alendronate (FOSAMAX) 70 MG tablet Take 70 mg by mouth once a week. Take with a full glass of water on an empty stomach.   Yes Historical Provider, MD  amoxicillin-clavulanate (AUGMENTIN) 875-125 MG tablet Take 1 tablet by mouth every 12 (twelve) hours. 01/15/15 01/21/15 Yes Enid Baas, MD  diltiazem (CARDIZEM CD) 180 MG 24 hr capsule Take 1 capsule (180 mg total) by mouth daily. 01/15/15  Yes Enid Baas, MD  fluticasone (FLONASE) 50 MCG/ACT nasal spray USE 2 SPRAYS IN NOSE DAILY AS DIRECTED 08/12/11  Yes Bruce Romilda Garret, MD  gabapentin (NEURONTIN) 100 MG capsule Take 100 mg by mouth 3 (three) times daily.   Yes Historical Provider, MD  mirtazapine (REMERON) 30 MG tablet Take 30 mg by mouth at bedtime.   Yes Historical Provider, MD  nystatin cream (MYCOSTATIN) Apply 1 application topically 2 (two) times daily.   Yes Historical Provider, MD  omeprazole (PRILOSEC) 40 MG capsule Take 40 mg by mouth daily.   Yes Historical Provider, MD  ondansetron (ZOFRAN) 4 MG tablet Take 1 tablet (4 mg total) by mouth every 6 (six) hours. As needed for nausea or vomiting 02/03/12  Yes John-Adam Bonk, MD  polyethylene glycol (MIRALAX / GLYCOLAX) packet Take 17 g by mouth daily as needed for mild constipation.    Yes Historical Provider, MD  traMADol (ULTRAM) 50 MG tablet Take 50 mg by mouth every 6 (six) hours as needed for moderate pain.    Yes Historical Provider, MD  vitamin B-12 (CYANOCOBALAMIN) 100 MCG tablet Take 1 tablet (100 mcg total) by mouth daily. 01/15/15  Yes Enid Baas, MD  Vitamin D, Ergocalciferol,  (DRISDOL) 50000 UNITS CAPS Take 50,000 Units by mouth every 30 (thirty) days.   Yes Historical Provider, MD  warfarin (COUMADIN) 1 MG tablet Take 1.5-2 mg by mouth daily. 1.5mg  by mouth on mondays.  All other days of the week take 1mg  tablet.   Yes Historical Provider, MD    Physical Exam: Filed Vitals:   01/19/15 2134 01/19/15 2139 01/19/15 2200 01/19/15 2330  BP:  120/52 108/50 125/48  Pulse:  64 68 70  Temp:  98.8 F (37.1 C)    TempSrc:  Oral    Resp:  18 14 17   Height:  5\' 3"  (1.6 m)    Weight:  129 lb (58.514 kg)    SpO2: 92% 94% 94% 96%    Physical Exam  Constitutional: elderly woman, NAD HENT: Normocephalic. Oropharynx is clear and moist.  Eyes: Conjunctivae are normal. PERRLA, no scleral icterus.  Neck: Neck supple.  CVS: RR, NR, S1/S2 +, no murmurs Pulmonary: Effort and breath sounds normal, no wheezes, rales.  Abdominal: Soft. BS +,  no distension, tenderness Musculoskeletal: No edema.  Exquisitely tender to palpation over left foot, mainly over top of foot.  Some redness over dorsum of foot medially, no swelling, no podagra.  Neuro: Alert. Oriented only to person and place, not time Skin: Skin is warm and dry. Not diaphoretic. Mild erythema on left foot. No pallor. fungal rash on buttocks and at gluteal cleft.   Labs on Admission:  Basic Metabolic Panel:  Recent Labs Lab 01/13/15 0635 01/14/15 0409 01/14/15 1818 01/15/15 0533 01/19/15 2302  NA 137 137  --  136 132*  K 3.5 2.8* 3.9 3.8 2.7*  CL 106 103  --  103 97*  CO2 24 25  --  22 25  GLUCOSE 95 97  --  98 115*  BUN 6 6  --  7 <5*  CREATININE 0.76 0.72  --  0.81 0.87  CALCIUM 8.3* 8.8*  --  8.9 7.6*   CBC:  Recent Labs Lab 01/13/15 0635 01/14/15 0409 01/19/15 2302  WBC 9.9 9.9 8.6  NEUTROABS  --   --  4.4  HGB 10.0* 10.2* 9.3*  HCT 30.4* 31.1* 28.0*  MCV 85.3 84.7 85.4  PLT 182 217 291   CBG:  Recent  Labs Lab 01/14/15 0704 01/14/15 1105 01/14/15 1616 01/14/15 2115 01/15/15 0729   GLUCAP 95 123* 119* 113* 99    EKG: Normal sinus rhythm, RBBB, unchanged   If 7PM-7AM, please contact night-coverage [email or web address] Password [email or web address] 01/20/2015, 1:55 AM

## 2015-01-20 NOTE — Progress Notes (Signed)
[facility] PROGRESS NOTE  [name] Cunningham VFI:[id number] DOB: [birth year] DOA: 01/19/2015 PCP: No primary care provider on file.  HPI/Brief narrative 81yo woman who lives at home with daughter and has PMH of HLD, HTN, COPD, anxiety, GERD, CAD, Afib, osteoporosis. She presented with fevers after recent discharge from [facility] where she was treated for sepsis from a LLL pneumonia.  Assessment/Plan: Fever - Reported at home but afebrile currently - Pt completed abx course for recent PNA - CXR on admit was clear - UA was normal - BC and UC pending - Started diflucan for severe rash on buttocks - Tenderness of dorsum of B feet L>>>R in the setting of known gout. ESR elevated at 109. Suspect fever related to acute gout flare (per below) - Uric acid is mildly elevated at 6.7, however is NOT diagnostic in an acute gout flare  Suspected acute gout flare - Marked tenderness, redness involving B feet but L>>>R - ESR elevated at 109 - Patient was recently treated with prednisone taper for same with improvement, but sx worsened after completing taper - Will continue on trial of IV steroids, plan to eventually taper  Hypokalemia - Unclear cause, except maybe decreased PO intake - Continue to replace as needed  Essential hypertension - BP mildly soft this hospital stay - Holding sedating medications including gabapentin and remeron - Held BP meds overnight, including diltiazem - If BP improves, then would gradually resume meds   Coronary atherosclerosis - Pt denies chest pain currently - RBBB on EKG noted   Atrial fibrillation - She is on diltiazem, being held for now per above - Monitor on telemetry - Warfarin per pharmacy consult   Gout and foot pain - PT/OT ordered - suspect secondary to above.   Chronic anemia - No report of blood loss - Trend CBC  Diet: Heart healthy  DVT PPx: on coumadin per pharmacy dosing  Code Status: Full Family Communication: Pt in  room Disposition Plan: Pending   Consultants:    Procedures:    Antibiotics: Anti-infectives    Start     Dose/Rate Route Frequency Ordered Stop   01/20/15 1000  fluconazole (DIFLUCAN) tablet 50 mg     50 mg Oral Daily 01/20/15 0126        HPI/Subjective: Complains of continued B foot pain  Objective: Filed Vitals:   01/20/15 0209 01/20/15 0530 01/20/15 0948 01/20/15 1333  BP: 140/51 122/53 131/47 124/50  Pulse: 63 63 63 72  Temp: 98.3 F (36.8 C) 98.2 F (36.8 C)  98.5 F (36.9 C)  TempSrc: Oral Oral  Oral  Resp: 18 18  18   Height: 5' 3"  (1.6 m)     Weight: 66.9 kg (147 lb 7.8 oz)     SpO2: 98% 97%  91%    Intake/Output Summary (Last 24 hours) at 01/20/15 1438 Last data filed at 01/20/15 1335  Gross per 24 hour  Intake    785 ml  Output     45 ml  Net    740 ml   Filed Weights   01/19/15 2139 01/20/15 0209  Weight: 58.514 kg (129 lb) 66.9 kg (147 lb 7.8 oz)    Exam:   General:  Awake, in nad  Cardiovascular: regular, s1, s2  Respiratory: normal resp effort, no wheezing  Abdomen: soft, nondistended  Musculoskeletal: perfused, marked tenderness in B dorsum of feet with mild erythema   Data Reviewed: Basic Metabolic Panel:  Recent Labs Lab 01/14/15 0409 01/14/15 1818 01/15/15 0533 01/19/15 2302  01/20/15 0548  NA 137  --  136 132* 132*  K 2.8* 3.9 3.8 2.7* 3.4*  CL 103  --  103 97* 98*  CO2 25  --  22 25 25   GLUCOSE 97  --  98 115* 108*  BUN 6  --  7 <5* <5*  CREATININE 0.72  --  0.81 0.87 0.81  CALCIUM 8.8*  --  8.9 7.6* 7.6*   Liver Function Tests:  Recent Labs Lab 01/20/15 0548  AST 20  ALT 17  ALKPHOS 51  BILITOT 0.7  PROT 6.2*  ALBUMIN 2.6*   No results for input(s): LIPASE, AMYLASE in the last 168 hours. No results for input(s): AMMONIA in the last 168 hours. CBC:  Recent Labs Lab 01/14/15 0409 01/19/15 2302 01/20/15 0548  WBC 9.9 8.6 8.1  NEUTROABS  --  4.4  --   HGB 10.2* 9.3* 9.4*  HCT 31.1* 28.0* 29.3*   MCV 84.7 85.4 84.9  PLT 217 291 298   Cardiac Enzymes: No results for input(s): CKTOTAL, CKMB, CKMBINDEX, TROPONINI in the last 168 hours. BNP (last 3 results) No results for input(s): BNP in the last 8760 hours.  ProBNP (last 3 results) No results for input(s): PROBNP in the last 8760 hours.  CBG:  Recent Labs Lab 01/14/15 1105 01/14/15 1616 01/14/15 2115 01/15/15 0729 01/20/15 0610  GLUCAP 123* 119* 113* 99 99    Recent Results (from the past 240 hour(s))  Culture, blood (routine x 2)     Status: None   Collection Time: 01/11/15  4:51 PM  Result Value Ref Range Status   Specimen Description BLOOD LEFT AC  Final   Special Requests BOTTLES DRAWN AEROBIC AND ANAEROBIC  10CC  Final   Culture NO GROWTH 5 DAYS  Final   Report Status 01/16/2015 FINAL  Final  Culture, blood (routine x 2)     Status: None   Collection Time: 01/11/15  5:04 PM  Result Value Ref Range Status   Specimen Description BLOOD RIGHT AC  Final   Special Requests   Final    BOTTLES DRAWN AEROBIC AND ANAEROBIC  AER 10CC ANA [place]   Culture NO GROWTH 5 DAYS  Final   Report Status 01/16/2015 FINAL  Final     Studies: Dg Chest 2 View  01/19/2015  CLINICAL DATA:  Multiple complaints including fever for 2 days, bilateral foot pain for a good while. Unable to bear weight. Decreased urinary output. Pressure ulcer versus rash to sacrum and perineum. Crackles in the lung bases. EXAM: CHEST  2 VIEW COMPARISON:  01/10/2015 from [place]: Segmental elevation of the right hemidiaphragm. Cardiac enlargement. No pulmonary vascular congestion. Calcification in the mitral valve annulus. Calcified and tortuous aorta. No focal airspace disease or consolidation in the lungs. Degenerative changes in the spine. Degenerative changes in the shoulders. No blunting of costophrenic angles. No pneumothorax. IMPRESSION: Cardiac enlargement.  No evidence of active pulmonary disease. Electronically Signed    By: Lucienne Capers M.D.   On: 01/19/2015 23:22    Scheduled Meds: . docusate sodium  100 mg Oral BID  . fluconazole  50 mg Oral Daily  . fluticasone  2 spray Each Nare Daily  . nystatin cream  1 application Topical BID  . pantoprazole  80 mg Oral Daily  . sodium chloride  3 mL Intravenous Q12H  . warfarin  1 mg Oral ONCE-1800  . Warfarin - Pharmacist Dosing Inpatient   Does not apply [phone]  Continuous Infusions:   Active Problems:   Essential hypertension   Coronary atherosclerosis   Atrial fibrillation (HCC)   Hypokalemia   Fever, unspecified   CHIU, STEPHEN K  [facility] Pager [phone]. If 7PM-7AM, please contact night-coverage at [email or web address], password [facility] 01/20/2015, 2:38 PM  LOS: 1 day

## 2015-01-20 NOTE — Progress Notes (Signed)

## 2015-01-20 NOTE — Progress Notes (Signed)
Received pt report from Sarah,RN-ED.

## 2015-01-21 DIAGNOSIS — M109 Gout, unspecified: Secondary | ICD-10-CM

## 2015-01-21 DIAGNOSIS — E876 Hypokalemia: Secondary | ICD-10-CM | POA: Diagnosis not present

## 2015-01-21 DIAGNOSIS — I10 Essential (primary) hypertension: Secondary | ICD-10-CM | POA: Diagnosis not present

## 2015-01-21 DIAGNOSIS — I4891 Unspecified atrial fibrillation: Secondary | ICD-10-CM | POA: Diagnosis not present

## 2015-01-21 DIAGNOSIS — R509 Fever, unspecified: Secondary | ICD-10-CM | POA: Diagnosis not present

## 2015-01-21 LAB — PROTIME-INR
INR: 1.82 — ABNORMAL HIGH (ref 0.00–1.49)
Prothrombin Time: 21 seconds — ABNORMAL HIGH (ref 11.6–15.2)

## 2015-01-21 LAB — BASIC METABOLIC PANEL
Anion gap: 11 (ref 5–15)
CHLORIDE: 102 mmol/L (ref 101–111)
CO2: 24 mmol/L (ref 22–32)
CREATININE: 0.7 mg/dL (ref 0.44–1.00)
Calcium: 8.8 mg/dL — ABNORMAL LOW (ref 8.9–10.3)
GFR calc Af Amer: >60 mL/min (ref >60)
GFR calc non Af Amer: >60 mL/min (ref >60)
GLUCOSE: 214 mg/dL — AB (ref 65–99)
POTASSIUM: 3.4 mmol/L — AB (ref 3.5–5.1)
Sodium: 137 mmol/L (ref 135–145)

## 2015-01-21 LAB — GLUCOSE, CAPILLARY: GLUCOSE-CAPILLARY: 164 mg/dL — AB (ref 65–99)

## 2015-01-21 LAB — MAGNESIUM: Magnesium: 1.6 mg/dL — ABNORMAL LOW (ref 1.7–2.4)

## 2015-01-21 MED ORDER — MAGNESIUM SULFATE 2 GM/50ML IV SOLN
2.0000 g | Freq: Once | INTRAVENOUS | Status: AC
  Administered 2015-01-21: 2 g via INTRAVENOUS
  Filled 2015-01-21: qty 50

## 2015-01-21 MED ORDER — INDOMETHACIN 50 MG PO CAPS: 50.0000 mg | ORAL_CAPSULE | Freq: Three times a day (TID) | ORAL | Status: DC | PRN

## 2015-01-21 MED ORDER — WARFARIN SODIUM 3 MG PO TABS: 1.5000 mg | ORAL_TABLET | Freq: Once | ORAL | Status: DC

## 2015-01-21 MED ORDER — POTASSIUM CHLORIDE CRYS ER 20 MEQ PO TBCR
40.0000 meq | EXTENDED_RELEASE_TABLET | Freq: Once | ORAL | Status: DC
  Filled 2015-01-21: qty 2

## 2015-01-21 MED ORDER — HYDROCODONE-ACETAMINOPHEN 5-325 MG PO TABS: 1.0000 | ORAL_TABLET | ORAL | Status: DC | PRN

## 2015-01-21 MED ORDER — FLUCONAZOLE 50 MG PO TABS: 50.0000 mg | ORAL_TABLET | Freq: Every day | ORAL | Status: DC

## 2015-01-21 MED ORDER — PREDNISONE 20 MG PO TABS: 40.0000 mg | ORAL_TABLET | Freq: Every day | ORAL | Status: DC

## 2015-01-21 MED ORDER — PREDNISONE 20 MG PO TABS
40.0000 mg | ORAL_TABLET | Freq: Every day | ORAL | Status: DC
  Administered 2015-01-21: 40 mg via ORAL
  Filled 2015-01-21: qty 2

## 2015-01-21 MED ORDER — POTASSIUM CHLORIDE CRYS ER 20 MEQ PO TBCR
40.0000 meq | EXTENDED_RELEASE_TABLET | Freq: Once | ORAL | Status: AC
  Administered 2015-01-21: 40 meq via ORAL

## 2015-01-21 NOTE — Progress Notes (Signed)
ANTICOAGULATION CONSULT NOTE Pharmacy Consult for Coumadin Indication: atrial fibrillation  Allergies  Allergen Reactions  . Hydroxyzine Hcl Other (See Comments)    Makes patient feel like she does not want to live    Patient Measurements: Height: 5\' 3"  (160 cm) Weight: 145 lb 8.6 oz (66.017 kg) (bed) IBW/kg (Calculated) : 52.4  Vital Signs: Temp: 97.7 F (36.5 C) (10/17 0418) Temp Source: Oral (10/17 0418) BP: 122/46 mmHg (10/17 0418) Pulse Rate: 81 (10/17 0418)  Labs:  Recent Labs  01/19/15 2302 01/20/15 0548 01/21/15 0236  HGB 9.3* 9.4*  --   HCT 28.0* 29.3*  --   PLT 291 298  --   LABPROT 22.0* 22.1* 21.0*  INR 1.93* 1.95* 1.82*  CREATININE 0.87 0.81 0.70    Estimated Creatinine Clearance: 47.8 mL/min (by C-G formula based on Cr of 0.7).   Medical History: Past Medical History  Diagnosis Date  . HYPERLIPIDEMIA 01/06/2007  . HYPERTENSION 01/06/2007  . COPD 01/06/2007  . ANXIETY 11/21/2008  . SLEEP APNEA 11/21/2008  . DIVERTICULITIS OF COLON 07/23/2009  . Fistula   . GERD 01/06/2007  . PAD (peripheral artery disease) (HCC)   . CAD (coronary artery disease)     Mild by cath in remote past  . Atrial fibrillation (HCC)   . CHF (congestive heart failure) (HCC)     Medications:  Prescriptions prior to admission  Medication Sig Dispense Refill Last Dose  . acetaminophen (TYLENOL) 325 MG tablet Take 2 tablets (650 mg total) by mouth every 6 (six) hours as needed for mild pain (headache). 30 tablet 0 01/18/2015 at Unknown time  . alendronate (FOSAMAX) 70 MG tablet Take 70 mg by mouth once a week. Take with a full glass of water on an empty stomach.   01/15/2015  . amoxicillin-clavulanate (AUGMENTIN) 875-125 MG tablet Take 1 tablet by mouth every 12 (twelve) hours. 12 tablet 0 01/19/2015 at Unknown time  . diltiazem (CARDIZEM CD) 180 MG 24 hr capsule Take 1 capsule (180 mg total) by mouth daily. 30 capsule 1 01/19/2015 at Unknown time  . fluticasone (FLONASE) 50  MCG/ACT nasal spray USE 2 SPRAYS IN NOSE DAILY AS DIRECTED 16 g 5 unk  . gabapentin (NEURONTIN) 100 MG capsule Take 100 mg by mouth 3 (three) times daily.   01/19/2015 at Unknown time  . mirtazapine (REMERON) 30 MG tablet Take 30 mg by mouth at bedtime.   01/18/2015 at Unknown time  . nystatin cream (MYCOSTATIN) Apply 1 application topically 2 (two) times daily.   01/19/2015 at Unknown time  . omeprazole (PRILOSEC) 40 MG capsule Take 40 mg by mouth daily.   01/19/2015 at Unknown time  . ondansetron (ZOFRAN) 4 MG tablet Take 1 tablet (4 mg total) by mouth every 6 (six) hours. As needed for nausea or vomiting 12 tablet 0 unk  . polyethylene glycol (MIRALAX / GLYCOLAX) packet Take 17 g by mouth daily as needed for mild constipation.    unk  . traMADol (ULTRAM) 50 MG tablet Take 50 mg by mouth every 6 (six) hours as needed for moderate pain.    unk  . vitamin B-12 (CYANOCOBALAMIN) 100 MCG tablet Take 1 tablet (100 mcg total) by mouth daily. 30 tablet 0 01/19/2015 at Unknown time  . Vitamin D, Ergocalciferol, (DRISDOL) 50000 UNITS CAPS Take 50,000 Units by mouth every 30 (thirty) days.   01/05/2015  . warfarin (COUMADIN) 1 MG tablet Take 1.5-2 mg by mouth daily. 1.5mg  by mouth on mondays.  All other days  of the week take  tablet.   01/18/2015 at Unknown time    Assessment: 81 y.o. female admitted with AMS/fever, h/o Afib, to continue Coumadin. Current INR is subtherapeutic at 1.82. No reports of bleeding. No longer on enoxaparin.   Patient was started on prednisone today and this may increase the INR of patients on warfarin. Will continue to monitor INR closely. Fluconazole was started yesterday and may also increase INR of patients on warfarin.   Warfarin dose: 1.5 mg po qMon, 1 mg all other days   Goal of Therapy:  INR 2-3 Monitor platelets by anticoagulation protocol: Yes   Plan:  -Warfarin 1.5 mg po x1 -Daily INR - Monitor for s/sx of bleeding    Juanita Craver A 01/21/2015,9:35  AM

## 2015-01-21 NOTE — Discharge Summary (Signed)
Physician Discharge Summary  [name] Cunningham UEA:[id number] DOB: 08-03-1928 DOA: 01/19/2015  PCP: No primary care provider on file.  Admit date: 01/19/2015 Discharge date: 01/21/2015  Time spent: 20 minutes  Recommendations for Outpatient Follow-up:  1. Follow up with PCP in 1-2 weeks 2. Please monitor BP and heart rate closely as cardizem was held secondary to soft BP   Discharge Diagnoses:  Principal Problem:   Acute gout of left foot Active Problems:   Essential hypertension   Coronary atherosclerosis   Atrial fibrillation (HCC)   Hypokalemia   Fever, unspecified   Discharge Condition: Improved  Diet recommendation: Heart healthy  Filed Weights   01/19/15 2139 01/20/15 0209 01/21/15 0418  Weight: 58.514 kg (129 lb) 66.9 kg (147 lb 7.8 oz) 66.017 kg (145 lb 8.6 oz)    History of present illness:  Please review dictated H and P from 10/16 for details. Briefly, pt is an 81yo woman who lives at home with daughter and has PMH of HLD, HTN, COPD, anxiety, GERD, CAD, Afib, osteoporosis. She presented with fevers after recent discharge from [facility] where she was treated for sepsis from a LLL pneumonia. Patient was admitted for further work up.  Hospital Course:  Fever - Reported at home but afebrile on admit - Pt completed abx course for recent PNA - CXR on this admit was clear - UA was normal - Pt was started on diflucan for severe rash on buttocks - Patient was noted to have marked tenderness of dorsum of B feet but primarily L>>>R in the setting of known hx of gout. ESR was elevated at 109.  - Suspect fever related to acute gout flare (per below) - Uric acid was mildly elevated at 6.7, however uric acid is NOT diagnostic in an acute gout flare  Suspected acute gout flare - Marked tenderness, redness involving B feet but L>>>R - ESR elevated at 109 - Patient was recently treated with prednisone taper for same with improvement, but sx worsened after completing outpatient  taper - Marked improvement in symptoms with one dose of IV solumedrol and resolution of fevers - Patient will complete prednisone taper on discharge - Patient was evaluated by PT who recommends home health PT  Hypokalemia - Unclear cause, except maybe decreased PO intake - Continue to replace as needed  Hypomagnesemia - Replaced  Essential hypertension - BP mildly soft this hospital stay - Held sedating medications including gabapentin and remeron - Held BP meds including diltiazem - Recommend holding BP meds on discharge and if BP and HR would tolerate, then would resume cardizem   Coronary atherosclerosis - Pt denies chest pain - RBBB on EKG noted   Atrial fibrillation - She had been on diltiazem, being held for now per above - Remained stable on telemetry - Warfarin continued per pharmacy consult   Gout and foot pain - PT/OT ordered, per above - suspect secondary to above.   Chronic anemia - No report of blood loss - Trend CBC  Discharge Exam: Filed Vitals:   01/20/15 2037 01/21/15 0418 01/21/15 1100 01/21/15 1306  BP: 121/52 122/46 143/64 138/59  Pulse: 87 81  75  Temp: 97.8 F (36.6 C) 97.7 F (36.5 C)  97.7 F (36.5 C)  TempSrc: Oral Oral  Oral  Resp: 18 18 18 18   Height:      Weight:  66.017 kg (145 lb 8.6 oz)    SpO2: 91% 93% 96% 98%    General: Awake, in nad Cardiovascular: regular, s1, s2  Respiratory: normal resp effort, no wheezing  Discharge Instructions     Medication List    STOP taking these medications        amoxicillin-clavulanate 875-125 MG tablet  Commonly known as:  AUGMENTIN     diltiazem 180 MG 24 hr capsule  Commonly known as:  CARDIZEM CD      TAKE these medications        acetaminophen 325 MG tablet  Commonly known as:  TYLENOL  Take 2 tablets (650 mg total) by mouth every 6 (six) hours as needed for mild pain (headache).     alendronate 70 MG tablet  Commonly known as:  FOSAMAX  Take 70 mg by mouth once a  week. Take with a full glass of water on an empty stomach.     fluconazole 50 MG tablet  Commonly known as:  DIFLUCAN  Take 1 tablet (50 mg total) by mouth daily.     fluticasone 50 MCG/ACT nasal spray  Commonly known as:  FLONASE  USE 2 SPRAYS IN NOSE DAILY AS DIRECTED     gabapentin 100 MG capsule  Commonly known as:  NEURONTIN  Take 100 mg by mouth 3 (three) times daily.     indomethacin 50 MG capsule  Commonly known as:  INDOCIN  Take 1 capsule (50 mg total) by mouth 3 (three) times daily as needed for moderate pain (for gout flares).     mirtazapine 30 MG tablet  Commonly known as:  REMERON  Take 30 mg by mouth at bedtime.     nystatin cream  Commonly known as:  MYCOSTATIN  Apply 1 application topically 2 (two) times daily.     omeprazole 40 MG capsule  Commonly known as:  PRILOSEC  Take 40 mg by mouth daily.     ondansetron 4 MG tablet  Commonly known as:  ZOFRAN  Take 1 tablet (4 mg total) by mouth every 6 (six) hours. As needed for nausea or vomiting     polyethylene glycol packet  Commonly known as:  MIRALAX / GLYCOLAX  Take 17 g by mouth daily as needed for mild constipation.     predniSONE 20 MG tablet  Commonly known as:  DELTASONE  Take 2 tablets (40 mg total) by mouth daily with breakfast.     traMADol 50 MG tablet  Commonly known as:  ULTRAM  Take 50 mg by mouth every 6 (six) hours as needed for moderate pain.     vitamin B-12 100 MCG tablet  Commonly known as:  CYANOCOBALAMIN  Take 1 tablet (100 mcg total) by mouth daily.     Vitamin D (Ergocalciferol) 50000 UNITS Caps capsule  Commonly known as:  DRISDOL  Take 50,000 Units by mouth every 30 (thirty) days.     warfarin 1 MG tablet  Commonly known as:  COUMADIN  Take 1.5-2 mg by mouth daily. 1.74m by mouth on mondays.  All other days of the week take 133mtablet.       Allergies  Allergen Reactions  . Hydroxyzine Hcl Other (See Comments)    Makes patient feel like she does not want to live    Follow-up Information    [facility] as soon as possible for a visit on 01/29/2015.   Specialty:  Family Medicine   Why:  Hospital follow up 8:45 am   Contact information:   [address] [phone]  The results of significant diagnostics from this hospitalization (including imaging, microbiology, ancillary and laboratory) are listed below for reference.    Significant Diagnostic Studies: Dg Chest 2 View  01/19/2015  CLINICAL DATA:  Multiple complaints including fever for 2 days, bilateral foot pain for a good while. Unable to bear weight. Decreased urinary output. Pressure ulcer versus rash to sacrum and perineum. Crackles in the lung bases. EXAM: CHEST  2 VIEW COMPARISON:  01/10/2015 from [place]: Segmental elevation of the right hemidiaphragm. Cardiac enlargement. No pulmonary vascular congestion. Calcification in the mitral valve annulus. Calcified and tortuous aorta. No focal airspace disease or consolidation in the lungs. Degenerative changes in the spine. Degenerative changes in the shoulders. No blunting of costophrenic angles. No pneumothorax. IMPRESSION: Cardiac enlargement.  No evidence of active pulmonary disease. Electronically Signed   By: Lucienne Capers M.D.   On: 01/19/2015 23:22   Dg Chest 2 View  01/10/2015  CLINICAL DATA:  Acute onset of nausea, vomiting and weakness. Initial encounter. EXAM: CHEST  2 VIEW COMPARISON:  Chest radiograph performed 06/20/2012 FINDINGS: The lungs are well-aerated. Minimal left basilar opacity likely reflects atelectasis. Pulmonary vascularity is at the upper limits of normal. There is no evidence of pleural effusion or pneumothorax. The heart is normal in size; the mediastinal contour is within normal limits. No acute osseous abnormalities are seen. IMPRESSION: Minimal left basilar opacity likely reflects atelectasis. Lungs  otherwise grossly clear. Electronically Signed   By: Garald Balding M.D.   On: 01/10/2015 21:26   Ct Head Wo Contrast  01/10/2015  CLINICAL DATA:  weakness, n/v x 2 days. no vomiting for past few hours. Pt hx dementia, afib, CHF, osteoporosis, and sleep apnea. VS stable en route except spo2 88% on RA, 94% on 2L. EXAM: CT HEAD WITHOUT CONTRAST TECHNIQUE: Contiguous axial images were obtained from the base of the skull through the vertex without intravenous contrast. COMPARISON:  05/31/2010 FINDINGS: Atherosclerotic and physiologic intracranial calcifications. Diffuse parenchymal atrophy. Patchy areas of hypoattenuation in deep and periventricular white matter bilaterally. Negative for acute intracranial hemorrhage, mass lesion, acute infarction, midline shift, or mass-effect. Acute infarct may be inapparent on noncontrast CT. Ventricles and sulci symmetric. Bone windows demonstrate no focal lesion. IMPRESSION: 1. Negative for bleed or other acute intracranial process. 2. Atrophy and nonspecific white matter changes. Electronically Signed   By: Lucrezia Europe M.D.   On: 01/10/2015 20:52   Ct Angio Chest Pe W/cm &/or Wo Cm  01/12/2015  CLINICAL DATA:  Hypoxia. EXAM: CT ANGIOGRAPHY CHEST WITH CONTRAST TECHNIQUE: Multidetector CT imaging of the chest was performed using the standard protocol during bolus administration of intravenous contrast. Multiplanar CT image reconstructions and MIPs were obtained to evaluate the vascular anatomy. CONTRAST:  86m OMNIPAQUE IOHEXOL 350 MG/ML SOLN COMPARISON:  CT scan of April 01, 2009. FINDINGS: No pneumothorax is noted. Minimal bilateral pleural effusions are noted with adjacent subsegmental atelectasis. Focal airspace opacity is noted in the left lower lobe which may represent pneumonia. There is no evidence of pulmonary embolus. Atherosclerosis of thoracic aorta is noted without aneurysm or dissection. Visualized portion of upper abdomen is unremarkable. Coronary artery  calcifications are noted. No mediastinal mass or adenopathy is noted. No significant osseous abnormality is noted. Review of the MIP images confirms the above findings. IMPRESSION: No evidence of pulmonary embolus is noted. Coronary artery calcifications are noted suggesting coronary artery disease. Atherosclerosis of thoracic aorta is noted without aneurysm or dissection. Minimal bilateral pleural effusions are noted  with adjacent subsegmental atelectasis. Focal airspace opacity is noted in the left lower lobe which may represent pneumonia. Electronically Signed   By: Marijo Conception, M.D.   On: 01/12/2015 14:53   Ct Abdomen Pelvis W Contrast  01/10/2015  CLINICAL DATA:  Weakness, nausea and vomiting for 2 days. EXAM: CT ABDOMEN AND PELVIS WITH CONTRAST TECHNIQUE: Multidetector CT imaging of the abdomen and pelvis was performed using the standard protocol following bolus administration of intravenous contrast. CONTRAST:  55m OMNIPAQUE IOHEXOL 300 MG/ML  SOLN COMPARISON:  None. FINDINGS: Lower chest: Sub cm nodule in the posterior left lower lobe, probably not significantly changed from 03/30/2009. No acute findings in the lower chest. Hepatobiliary: Prior cholecystectomy. Liver and bile ducts are unremarkable. Pancreas: Normal Spleen: Normal Adrenals/Urinary Tract: The adrenals and kidneys are normal in appearance. There is no urinary calculus evident. There is no hydronephrosis or ureteral dilatation. Collecting systems and ureters appear unremarkable. Stomach/Bowel: Small hiatal hernia. Small bowel is normal. Mild colonic diverticulosis. Moderate mural enhancement of the rectum and rectosigmoid. This is nonspecific but could represent proctitis or colitis. Vascular/Lymphatic: The abdominal aorta is normal in caliber, heavily calcified. Reproductive: Hysterectomy.  No adnexal abnormalities. Other: No ascites.  No adenopathy. Musculoskeletal: No significant musculoskeletal lesions. Moderately severe  degenerative disc and facet changes are present. IMPRESSION: 1. Moderate mural enhancement of the rectum and rectosigmoid, raising the question of proctitis or colitis. 2. Small hiatal hernia 3. Diverticulosis Electronically Signed   By: DAndreas NewportM.D.   On: 01/10/2015 22:39    Microbiology: Recent Results (from the past 240 hour(s))  Culture, blood (routine x 2)     Status: None   Collection Time: 01/11/15  4:51 PM  Result Value Ref Range Status   Specimen Description BLOOD LEFT AC  Final   Special Requests BOTTLES DRAWN AEROBIC AND ANAEROBIC  10CC  Final   Culture NO GROWTH 5 DAYS  Final   Report Status 01/16/2015 FINAL  Final  Culture, blood (routine x 2)     Status: None   Collection Time: 01/11/15  5:04 PM  Result Value Ref Range Status   Specimen Description BLOOD RIGHT AC  Final   Special Requests   Final    BOTTLES DRAWN AEROBIC AND ANAEROBIC  AER 10CC ANA [place]  Culture NO GROWTH 5 DAYS  Final   Report Status 01/16/2015 FINAL  Final  Urine culture     Status: None (Preliminary result)   Collection Time: 01/19/15 11:38 PM  Result Value Ref Range Status   Specimen Description URINE, RANDOM  Final   Special Requests NONE  Final   Culture CULTURE REINCUBATED FOR BETTER GROWTH  Final   Report Status PENDING  Incomplete     Labs: Basic Metabolic Panel:  Recent Labs Lab 01/14/15 1818 01/15/15 0533 01/19/15 2302 01/20/15 0548 01/21/15 0236 01/21/15 0757  NA  --  136 132* 132* 137  --   K 3.9 3.8 2.7* 3.4* 3.4*  --   CL  --  103 97* 98* 102  --   CO2  --  22 25 25 24   --   GLUCOSE  --  98 115* 108* 214*  --   BUN  --  7 <5* <5* <5*  --   CREATININE  --  0.81 0.87 0.81 0.70  --   CALCIUM  --  8.9 7.6* 7.6* 8.8*  --   MG  --   --   --   --   --  1.6*   Liver Function Tests:  Recent Labs Lab 01/20/15 0548  AST 20  ALT 17  ALKPHOS 51  BILITOT 0.7  PROT 6.2*  ALBUMIN 2.6*   No results for input(s): LIPASE, AMYLASE in the last 168 hours. No results for  input(s): AMMONIA in the last 168 hours. CBC:  Recent Labs Lab 01/19/15 2302 01/20/15 0548  WBC 8.6 8.1  NEUTROABS 4.4  --   HGB 9.3* 9.4*  HCT 28.0* 29.3*  MCV 85.4 84.9  PLT 291 298   Cardiac Enzymes: No results for input(s): CKTOTAL, CKMB, CKMBINDEX, TROPONINI in the last 168 hours. BNP: BNP (last 3 results) No results for input(s): BNP in the last 8760 hours.  ProBNP (last 3 results) No results for input(s): PROBNP in the last 8760 hours.  CBG:  Recent Labs Lab 01/14/15 1616 01/14/15 2115 01/15/15 0729 01/20/15 0610 01/21/15 0546  GLUCAP 119* 113* 99 99 164*     Signed:  Nuala Chiles K  [facility] 01/21/2015, 3:10 PM

## 2015-01-21 NOTE — Progress Notes (Signed)
Physical Therapy Treatment Patient Details Name: [name] Cunningham MRN: [id number] DOB: April 12, 1928 Today's Date: 01/21/2015    History of Present Illness 81yo woman who lives at home with daughter and has PMH of HLD, HTN, COPD, anxiety, GERD, CAD, Afib, osteoporosis. She presented with fevers after recent discharge from [facility] where she was treated for sepsis from a LLL pneumonia.    PT Comments    Pt is progressing with therapy and is showing functional improvements with increased independence.  Session was focused on functional transfers and ambulation in the pt room.  At this point, PT feels that pt will be safe for discharge home with HHPT with the assistance of her supportive daughter who is available for 24 hour supervision.   Follow Up Recommendations  Home health PT;Supervision/Assistance - 24 hour     Equipment Recommendations  Other (comment) (Tub transfer bench; pt has wheelchair at home)    Recommendations for Other Services       Precautions / Restrictions Precautions Precautions: Fall Restrictions Weight Bearing Restrictions: No    Mobility  Bed Mobility Overal bed mobility: Needs Assistance Bed Mobility: Supine to Sit     Supine to sit: Mod assist (to elevate trunk from bed)        Transfers Overall transfer level: Needs assistance Equipment used: Rolling walker (2 wheeled) Transfers: Sit to/from [employer] Sit to Stand: Min assist (increased time; VCs for hand placement and to scoot to edge) Stand pivot transfers: Min assist (from bed to [facility])       General transfer comment: Min A to initiate standing, VCs for handplacement and to scoot to edge of surface. Somewhat unsteady.  Ambulation/Gait Ambulation/Gait assistance: Min guard (for safety) Ambulation Distance (Feet): 10 Feet Assistive device: Rolling walker (2 wheeled) Gait Pattern/deviations: Step-through pattern;Decreased stride length;Trunk flexed Gait velocity: slow Gait  velocity interpretation: Below normal speed for age/gender General Gait Details: pt able to turn with no LOB noted   Stairs            Wheelchair Mobility    Modified Rankin (Stroke Patients Only)       Balance Overall balance assessment: Needs assistance Sitting-balance support: Feet supported Sitting balance-Leahy Scale: Good Sitting balance - Comments: pt able to sit and reach behind her to wipe her bottom when on the [facility]   Standing balance support: During functional activity;Bilateral upper extremity supported Standing balance-Leahy Scale: Poor                      Cognition Arousal/Alertness: Awake/alert Behavior During Therapy: WFL for tasks assessed/performed Overall Cognitive Status: Within Functional Limits for tasks assessed (minimal dementia issues at baseline, appropriate with PT)                      Exercises      General Comments General comments (skin integrity, edema, etc.): Pt daughter present and supportive.  Per daughter and pt discharge home is preferred over SNF.  24 hr supervision is available.  Pt educated on weightshifting to prevent skin breakdown.      Pertinent Vitals/Pain Pain Assessment: No/denies pain    Home Living                      Prior Function            PT Goals (current goals can now be found in the care plan section) Acute Rehab PT Goals Patient Stated Goal: I want  to go home PT Goal Formulation: With patient/family Time For Goal Achievement: 02/03/15 Potential to Achieve Goals: Good Progress towards PT goals: Progressing toward goals    Frequency  Min 3X/week    PT Plan Current plan remains appropriate    Co-evaluation             End of Session Equipment Utilized During Treatment: Gait belt Activity Tolerance: Patient tolerated treatment well Patient left: in chair;with call bell/phone within reach;with family/visitor present     Time: [id number] PT Time Calculation (min)  (ACUTE ONLY): 25 min  Charges:  $Therapeutic Activity: 8-22 mins $Self Care/Home Management: 8-22                    G Codes:      Langley Flatley 01/21/2015, 10:40 AM  Arnoldo MoraleAmanda Delaynee Alred, SPT

## 2015-01-21 NOTE — Progress Notes (Signed)
Occupational Therapy Evaluation Patient Details Name: [name] Cunningham MRN: [id number] DOB: Jun 07, 1928 Today's Date: 01/21/2015    History of Present Illness 81yo woman who lives at home with daughter and has PMH of HLD, HTN, COPD, anxiety, GERD, CAD, Afib, osteoporosis. She presented with fevers after recent discharge from [facility] where she was treated for sepsis from a LLL pneumonia.   Clinical Impression   Pt close to baseline level of function regarding ADL and functional mobility for ADL @ RW level. Recommend use of tub bench for shower. Discussed ome safety and reducing risk of falls. Pt/daughter verbalized understanding. Daughter/pt realize this is out of pocket expense. Pt ready for D/C home with initial 24/7 S when medically stable.     Follow Up Recommendations  No OT follow up;Supervision/Assistance - 24 hour    Equipment Recommendations  Tub/shower bench    Recommendations for Other Services       Precautions / Restrictions Precautions Precautions: Fall Restrictions Weight Bearing Restrictions: No      Mobility Bed Mobility General bed mobility comments: OOB in recliner`  Transfers Overall transfer level: Needs assistance Equipment used: Rolling walker (2 wheeled) Transfers: Sit to/from [employer] Sit to Stand: Supervision Stand pivot transfers: Supervision           Balance Overall balance assessment: Needs assistance Sitting-balance support: Feet supported Sitting balance-Leahy Scale: Good Sitting balance - Comments: pt able to sit and reach behind her to wipe her bottom when on the [facility]   Standing balance support: During functional activity;Bilateral upper extremity supported Standing balance-Leahy Scale: Fair                              ADL Overall ADL's : At baseline                                       General ADL Comments: Discussed home safety and fall prevention. Pt would benefit from use  of tub bench. REcommended tub bench and demonstrted use for pt/daughter educaiton. Daughter realizes this is out of pocket expense     Vision     Perception     Praxis      Pertinent Vitals/Pain Pain Assessment: No/denies pain     Hand Dominance     Extremity/Trunk Assessment Upper Extremity Assessment Upper Extremity Assessment: Overall WFL for tasks assessed   Lower Extremity Assessment Lower Extremity Assessment: Defer to PT evaluation   Cervical / Trunk Assessment Cervical / Trunk Assessment: Kyphotic   Communication Communication Communication: No difficulties   Cognition Arousal/Alertness: Awake/alert Behavior During Therapy: WFL for tasks assessed/performed Overall Cognitive Status: Within Functional Limits for tasks assessed (has minimal dementia issues at baseline, appropriate with PT)                     General Comments       Exercises       Shoulder Instructions      Home Living Family/patient expects to be discharged to:: Private residence Living Arrangements: Children Available Help at Discharge: Family Type of Home: Apartment Home Access: Level entry     Home Layout: One level     Bathroom Shower/Tub: Chief Strategy Officer: Standard Bathroom Accessibility: Yes How Accessible: Accessible via walker Home Equipment: Walker - 4 wheels;Cane - single point;Bedside commode  Prior Functioning/Environment Level of Independence: Independent with assistive device(s) ([facility])        Comments: Pt has been working with HHPT recently    OT Diagnosis: Generalized weakness   OT Problem List: Decreased strength;Decreased activity tolerance;Decreased knowledge of use of DME or AE   OT Treatment/Interventions:      OT Goals(Current goals can be found in the care plan section) Acute Rehab OT Goals Patient Stated Goal: I want to go home OT Goal Formulation: All assessment and education  complete, DC therapy  OT Frequency:     Barriers to D/C:            Co-evaluation              End of Session Equipment Utilized During Treatment: Gait belt;Rolling walker Nurse Communication: Mobility status  Activity Tolerance: Patient tolerated treatment well Patient left: in chair;with call bell/phone within reach;with nursing/sitter in room   Time: 1200-1219 OT Time Calculation (min): 19 min Charges:  OT General Charges $OT Visit: 1 Procedure OT Evaluation $Initial OT Evaluation Tier I: 1 Procedure G-Codes: OT G-codes **NOT FOR INPATIENT CLASS** Functional Assessment Tool Used: clinical judgement Functional Limitation: Self care Self Care Current Status ([id number]): At least 1 percent but less than 20 percent impaired, limited or restricted Self Care Goal Status (U0454(G8988): At least 1 percent but less than 20 percent impaired, limited or restricted Self Care Discharge Status [id number]([id number]): At least 1 percent but less than 20 percent impaired, limited or restricted  Alicianna Litchford,HILLARY 01/21/2015, 12:28 PM   [facility] Hospital[facility] Crissa Sowder, OTR/L  [phone] 01/21/2015

## 2015-01-22 LAB — URINE CULTURE: Culture: 20000

## 2015-01-29 ENCOUNTER — Ambulatory Visit: Payer: Medicare Other | Admitting: Family

## 2015-01-29 DIAGNOSIS — Z0289 Encounter for other administrative examinations: Secondary | ICD-10-CM

## 2015-06-10 ENCOUNTER — Telehealth: Payer: Self-pay | Admitting: Cardiovascular Disease

## 2015-06-10 NOTE — Telephone Encounter (Signed)
Scheduling trying to reach daughter to schedule appt

## 2015-06-10 NOTE — Telephone Encounter (Signed)
New message   Pt daughter calling for appt for mother  She is now out of hospice and she wants her mother to reestablish with Dr.McAlhany  Please call daughter

## 2015-06-12 NOTE — Telephone Encounter (Signed)
Scheduler talked with pt's daughter and appt made for pt to see Dr. Clifton JamesMcAlhany on 3/9

## 2015-06-13 ENCOUNTER — Encounter: Payer: Medicare Other | Admitting: Cardiovascular Disease

## 2015-06-13 NOTE — Progress Notes (Signed)
No show

## 2015-06-20 ENCOUNTER — Encounter: Payer: Self-pay | Admitting: Internal Medicine

## 2016-12-25 DIAGNOSIS — F39 Unspecified mood [affective] disorder: Secondary | ICD-10-CM | POA: Diagnosis not present

## 2016-12-25 DIAGNOSIS — J449 Chronic obstructive pulmonary disease, unspecified: Secondary | ICD-10-CM | POA: Diagnosis not present

## 2016-12-25 DIAGNOSIS — F0150 Vascular dementia without behavioral disturbance: Secondary | ICD-10-CM | POA: Diagnosis not present

## 2016-12-25 DIAGNOSIS — I5030 Unspecified diastolic (congestive) heart failure: Secondary | ICD-10-CM | POA: Diagnosis not present

## 2016-12-25 DIAGNOSIS — K219 Gastro-esophageal reflux disease without esophagitis: Secondary | ICD-10-CM | POA: Diagnosis not present

## 2016-12-25 DIAGNOSIS — I4891 Unspecified atrial fibrillation: Secondary | ICD-10-CM | POA: Diagnosis not present

## 2017-01-27 DIAGNOSIS — I4891 Unspecified atrial fibrillation: Secondary | ICD-10-CM | POA: Diagnosis not present

## 2017-01-27 DIAGNOSIS — F0150 Vascular dementia without behavioral disturbance: Secondary | ICD-10-CM | POA: Diagnosis not present

## 2017-01-27 DIAGNOSIS — I10 Essential (primary) hypertension: Secondary | ICD-10-CM | POA: Diagnosis not present

## 2017-01-27 DIAGNOSIS — F39 Unspecified mood [affective] disorder: Secondary | ICD-10-CM | POA: Diagnosis not present

## 2017-01-27 DIAGNOSIS — K219 Gastro-esophageal reflux disease without esophagitis: Secondary | ICD-10-CM | POA: Diagnosis not present

## 2017-01-27 DIAGNOSIS — J449 Chronic obstructive pulmonary disease, unspecified: Secondary | ICD-10-CM | POA: Diagnosis not present

## 2017-03-29 DIAGNOSIS — F0150 Vascular dementia without behavioral disturbance: Secondary | ICD-10-CM | POA: Diagnosis not present

## 2017-03-29 DIAGNOSIS — I482 Chronic atrial fibrillation: Secondary | ICD-10-CM | POA: Diagnosis not present

## 2017-03-29 DIAGNOSIS — F39 Unspecified mood [affective] disorder: Secondary | ICD-10-CM | POA: Diagnosis not present

## 2017-03-29 DIAGNOSIS — J439 Emphysema, unspecified: Secondary | ICD-10-CM | POA: Diagnosis not present

## 2017-03-29 DIAGNOSIS — I5032 Chronic diastolic (congestive) heart failure: Secondary | ICD-10-CM | POA: Diagnosis not present

## 2017-04-06 ENCOUNTER — Emergency Department: Payer: Medicare Other

## 2017-04-06 ENCOUNTER — Other Ambulatory Visit: Payer: Self-pay

## 2017-04-06 ENCOUNTER — Inpatient Hospital Stay
Admission: EM | Admit: 2017-04-06 | Discharge: 2017-04-09 | DRG: 377 | Disposition: A | Discharge status: Skilled Nursing Facility | Payer: Medicare Other | Attending: Internal Medicine | Admitting: Internal Medicine

## 2017-04-06 ENCOUNTER — Encounter: Payer: Self-pay | Admitting: Emergency Medicine

## 2017-04-06 DIAGNOSIS — Z9049 Acquired absence of other specified parts of digestive tract: Secondary | ICD-10-CM | POA: Diagnosis not present

## 2017-04-06 DIAGNOSIS — K625 Hemorrhage of anus and rectum: Secondary | ICD-10-CM | POA: Diagnosis not present

## 2017-04-06 DIAGNOSIS — N179 Acute kidney failure, unspecified: Secondary | ICD-10-CM | POA: Diagnosis present

## 2017-04-06 DIAGNOSIS — Z825 Family history of asthma and other chronic lower respiratory diseases: Secondary | ICD-10-CM | POA: Diagnosis not present

## 2017-04-06 DIAGNOSIS — Z888 Allergy status to other drugs, medicaments and biological substances status: Secondary | ICD-10-CM

## 2017-04-06 DIAGNOSIS — Z7951 Long term (current) use of inhaled steroids: Secondary | ICD-10-CM | POA: Diagnosis not present

## 2017-04-06 DIAGNOSIS — I5030 Unspecified diastolic (congestive) heart failure: Secondary | ICD-10-CM | POA: Diagnosis present

## 2017-04-06 DIAGNOSIS — G4730 Sleep apnea, unspecified: Secondary | ICD-10-CM | POA: Diagnosis present

## 2017-04-06 DIAGNOSIS — K5731 Diverticulosis of large intestine without perforation or abscess with bleeding: Secondary | ICD-10-CM | POA: Diagnosis present

## 2017-04-06 DIAGNOSIS — G9341 Metabolic encephalopathy: Secondary | ICD-10-CM | POA: Diagnosis present

## 2017-04-06 DIAGNOSIS — Z7983 Long term (current) use of bisphosphonates: Secondary | ICD-10-CM

## 2017-04-06 DIAGNOSIS — Z90710 Acquired absence of both cervix and uterus: Secondary | ICD-10-CM | POA: Diagnosis not present

## 2017-04-06 DIAGNOSIS — I110 Hypertensive heart disease with heart failure: Secondary | ICD-10-CM | POA: Diagnosis present

## 2017-04-06 DIAGNOSIS — I482 Chronic atrial fibrillation: Secondary | ICD-10-CM | POA: Diagnosis present

## 2017-04-06 DIAGNOSIS — E876 Hypokalemia: Secondary | ICD-10-CM | POA: Diagnosis present

## 2017-04-06 DIAGNOSIS — E871 Hypo-osmolality and hyponatremia: Secondary | ICD-10-CM | POA: Diagnosis present

## 2017-04-06 DIAGNOSIS — J449 Chronic obstructive pulmonary disease, unspecified: Secondary | ICD-10-CM | POA: Diagnosis present

## 2017-04-06 DIAGNOSIS — K219 Gastro-esophageal reflux disease without esophagitis: Secondary | ICD-10-CM | POA: Diagnosis present

## 2017-04-06 DIAGNOSIS — Z7901 Long term (current) use of anticoagulants: Secondary | ICD-10-CM

## 2017-04-06 DIAGNOSIS — I2510 Atherosclerotic heart disease of native coronary artery without angina pectoris: Secondary | ICD-10-CM | POA: Diagnosis present

## 2017-04-06 DIAGNOSIS — K921 Melena: Secondary | ICD-10-CM

## 2017-04-06 DIAGNOSIS — Z87891 Personal history of nicotine dependence: Secondary | ICD-10-CM

## 2017-04-06 DIAGNOSIS — Z66 Do not resuscitate: Secondary | ICD-10-CM | POA: Diagnosis present

## 2017-04-06 DIAGNOSIS — N390 Urinary tract infection, site not specified: Secondary | ICD-10-CM | POA: Diagnosis present

## 2017-04-06 DIAGNOSIS — Z833 Family history of diabetes mellitus: Secondary | ICD-10-CM

## 2017-04-06 DIAGNOSIS — F0390 Unspecified dementia without behavioral disturbance: Secondary | ICD-10-CM | POA: Diagnosis present

## 2017-04-06 DIAGNOSIS — Z7952 Long term (current) use of systemic steroids: Secondary | ICD-10-CM

## 2017-04-06 DIAGNOSIS — Z8249 Family history of ischemic heart disease and other diseases of the circulatory system: Secondary | ICD-10-CM | POA: Diagnosis not present

## 2017-04-06 DIAGNOSIS — E785 Hyperlipidemia, unspecified: Secondary | ICD-10-CM | POA: Diagnosis present

## 2017-04-06 DIAGNOSIS — I739 Peripheral vascular disease, unspecified: Secondary | ICD-10-CM | POA: Diagnosis present

## 2017-04-06 DIAGNOSIS — Z9849 Cataract extraction status, unspecified eye: Secondary | ICD-10-CM

## 2017-04-06 DIAGNOSIS — Z800 Family history of malignant neoplasm of digestive organs: Secondary | ICD-10-CM

## 2017-04-06 LAB — URINALYSIS, ROUTINE W REFLEX MICROSCOPIC
Bilirubin Urine: NEGATIVE
Glucose, UA: NEGATIVE mg/dL
HGB URINE DIPSTICK: NEGATIVE
Ketones, ur: NEGATIVE mg/dL
Nitrite: NEGATIVE
PROTEIN: NEGATIVE mg/dL
SPECIFIC GRAVITY, URINE: 1.005 (ref 1.005–1.030)
pH: 5 (ref 5.0–8.0)

## 2017-04-06 LAB — COMPREHENSIVE METABOLIC PANEL
ALK PHOS: 88 U/L (ref 38–126)
ALT: 16 U/L (ref 14–54)
AST: 32 U/L (ref 15–41)
Albumin: 4.3 g/dL (ref 3.5–5.0)
Anion gap: 12 (ref 5–15)
BILIRUBIN TOTAL: 1 mg/dL (ref 0.3–1.2)
BUN: 25 mg/dL — AB (ref 6–20)
CALCIUM: 9.6 mg/dL (ref 8.9–10.3)
CO2: 22 mmol/L (ref 22–32)
CREATININE: 1.61 mg/dL — AB (ref 0.44–1.00)
Chloride: 90 mmol/L — ABNORMAL LOW (ref 101–111)
GFR, EST AFRICAN AMERICAN: 32 mL/min — AB (ref >60)
GFR, EST NON AFRICAN AMERICAN: 28 mL/min — AB (ref >60)
Glucose, Bld: 170 mg/dL — ABNORMAL HIGH (ref 65–99)
Potassium: 4.7 mmol/L (ref 3.5–5.1)
Sodium: 124 mmol/L — ABNORMAL LOW (ref 135–145)
Total Protein: 7.7 g/dL (ref 6.5–8.1)

## 2017-04-06 LAB — PROTIME-INR
INR: 1.48
PROTHROMBIN TIME: 17.8 s — AB (ref 11.4–15.2)

## 2017-04-06 LAB — CBC WITH DIFFERENTIAL/PLATELET
BASOS PCT: 0 %
Basophils Absolute: 0.1 10*3/uL (ref 0–0.1)
EOS PCT: 0 %
Eosinophils Absolute: 0.1 10*3/uL (ref 0–0.7)
HEMATOCRIT: 39.1 % (ref 35.0–47.0)
Hemoglobin: 13.1 g/dL (ref 12.0–16.0)
LYMPHS PCT: 7 %
Lymphs Abs: 1.4 10*3/uL (ref 1.0–3.6)
MCH: 28.2 pg (ref 26.0–34.0)
MCHC: 33.6 g/dL (ref 32.0–36.0)
MCV: 84 fL (ref 80.0–100.0)
MONO ABS: 0.4 10*3/uL (ref 0.2–0.9)
MONOS PCT: 2 %
Neutro Abs: 17 10*3/uL — ABNORMAL HIGH (ref 1.4–6.5)
Neutrophils Relative %: 91 %
PLATELETS: 400 10*3/uL (ref 150–440)
RBC: 4.66 MIL/uL (ref 3.80–5.20)
RDW: 13.2 % (ref 11.5–14.5)
WBC: 18.9 10*3/uL — ABNORMAL HIGH (ref 3.6–11.0)

## 2017-04-06 LAB — TYPE AND SCREEN
ABO/RH(D): O POS
ANTIBODY SCREEN: NEGATIVE

## 2017-04-06 LAB — APTT: APTT: 33 s (ref 24–36)

## 2017-04-06 LAB — TROPONIN I

## 2017-04-06 LAB — LIPASE, BLOOD: LIPASE: 44 U/L (ref 11–51)

## 2017-04-06 LAB — LACTIC ACID, PLASMA: Lactic Acid, Venous: 2.9 mmol/L (ref 0.5–1.9)

## 2017-04-06 LAB — HEMOGLOBIN: HEMOGLOBIN: 12.2 g/dL (ref 12.0–16.0)

## 2017-04-06 MED ORDER — ACETAMINOPHEN 650 MG RE SUPP: 650.0000 mg | Freq: Four times a day (QID) | RECTAL | Status: DC | PRN

## 2017-04-06 MED ORDER — SODIUM CHLORIDE 0.9 % IV BOLUS (SEPSIS)
1000.0000 mL | Freq: Once | INTRAVENOUS | Status: AC
  Administered 2017-04-06: 1000 mL via INTRAVENOUS

## 2017-04-06 MED ORDER — POLYETHYLENE GLYCOL 3350 17 G PO PACK: 17.0000 g | PACK | Freq: Every day | ORAL | Status: DC | PRN

## 2017-04-06 MED ORDER — SODIUM CHLORIDE 0.9 % IV SOLN
Freq: Once | INTRAVENOUS | Status: AC
  Administered 2017-04-06: 20:00:00 via INTRAVENOUS

## 2017-04-06 MED ORDER — ACETAMINOPHEN 325 MG PO TABS
650.0000 mg | ORAL_TABLET | Freq: Four times a day (QID) | ORAL | Status: DC | PRN
  Administered 2017-04-07 – 2017-04-08 (×4): 650 mg via ORAL
  Filled 2017-04-06 (×5): qty 2

## 2017-04-06 MED ORDER — ONDANSETRON HCL 4 MG/2ML IJ SOLN: 4.0000 mg | Freq: Four times a day (QID) | INTRAMUSCULAR | Status: DC | PRN

## 2017-04-06 MED ORDER — CEPHALEXIN 250 MG/5ML PO SUSR
250.0000 mg | Freq: Two times a day (BID) | ORAL | Status: DC
  Administered 2017-04-06 – 2017-04-07 (×2): 250 mg via ORAL
  Filled 2017-04-06 (×3): qty 5

## 2017-04-06 MED ORDER — ONDANSETRON HCL 4 MG PO TABS: 4.0000 mg | ORAL_TABLET | Freq: Four times a day (QID) | ORAL | Status: DC | PRN

## 2017-04-06 NOTE — Progress Notes (Signed)
PHARMACY NOTE -  ANTIBIOTIC RENAL DOSE ADJUSTMENT   Request received for Pharmacy to assist with antibiotic renal dose adjustment.   Patient has been initiated on cephalexin 500mg   Twice daily.  SCr 1.61, estimated CrCl 23.4 ml/min  Based on current renal function dose was adjusted to cephalexin 250mg  twice daily.   Gardner CandleSheema M Jaylise Peek, PharmD, BCPS Clinical Pharmacist 04/06/2017 7:12 PM

## 2017-04-06 NOTE — ED Triage Notes (Signed)
Pt comes into the ED via [facility] from [facility] c/o diarrhea and increased lethargy according to the facility.  Patient denies any abdominal pain at this time and is able to answer questions.  Patient unsure if she has had diarrhea for multiple days.  Patient appears to be in NAD at this time with even and unlabored respirations.

## 2017-04-06 NOTE — H&P (Signed)
[facility] - [place] at [facility]   PATIENT NAME: [name] Cunningham    MR#:  [id number]  DATE OF BIRTH:  12/18/1928  DATE OF ADMISSION:  04/06/2017  PRIMARY CARE PHYSICIAN: Karie SchwalbeLetvak, Richard I, MD   REQUESTING/REFERRING PHYSICIAN: Dr Fanny BienQuale  CHIEF COMPLAINT:   Bright red blood per rectum since this afternoon. HISTORY OF PRESENT ILLNESS:  [name] Cunningham  is a 84 y.o. female with a known history of diverticulosis per CT and sigmoidectomy in the past with diverticulitis and fistula tract, COPD, CAD, chronic A. fib on oral anticoagulation with Eliquis comes to the emergency room from [facility] living with diarrhea followed by bright red blood per rectum. Patient's daughter came all of a sudden.  Patient denies any abdominal pain.  She is hemodynamically stable she is being admitted for lower GI bleed suspected diverticular.  PAST MEDICAL HISTORY:   Past Medical History:  Diagnosis Date  . ANXIETY 11/21/2008  . Atrial fibrillation (HCC)   . CAD (coronary artery disease)    Mild by cath in remote past  . CHF (congestive heart failure) (HCC)   . COPD 01/06/2007  . DIVERTICULITIS OF COLON 07/23/2009  . Fistula   . GERD 01/06/2007  . HYPERLIPIDEMIA 01/06/2007  . HYPERTENSION 01/06/2007  . PAD (peripheral artery disease) (HCC)   . SLEEP APNEA 11/21/2008    PAST SURGICAL HISTOIRY:   Past Surgical History:  Procedure Laterality Date  . ABDOMINAL HYSTERECTOMY    . APPENDECTOMY    . CATARACT EXTRACTION    . CHOLECYSTECTOMY    . colovaginal fistula repair     after partial colectomy  . GALLBLADDER SURGERY    . PARTIAL COLECTOMY     diverticulitis  . TEMPORAL ARTERY BIOPSY / LIGATION    . TONSILLECTOMY  84 YEARS OLD  . TUBAL LIGATION      SOCIAL HISTORY:   Social History   Tobacco Use  . Smoking status: Former Smoker    Types: Cigarettes  . Smokeless tobacco: Never Used  Substance Use Topics  . Alcohol use: No    Alcohol/week: 0.0 oz     FAMILY HISTORY:   Family History  Problem Relation Age of Onset  . Heart disease Father   . Asthma Father   . Cancer Mother   . Diabetes Daughter   . Colon cancer Son     DRUG ALLERGIES:   Allergies  Allergen Reactions  . Hydroxyzine Hcl Other (See Comments)    Makes patient feel like she does not want to live    REVIEW OF SYSTEMS:  Review of Systems  Constitutional: Negative for chills, fever and weight loss.  HENT: Negative for ear discharge, ear pain and nosebleeds.   Eyes: Negative for blurred vision, pain and discharge.  Respiratory: Negative for sputum production, shortness of breath, wheezing and stridor.   Cardiovascular: Negative for chest pain, palpitations, orthopnea and PND.  Gastrointestinal: Positive for blood in stool. Negative for abdominal pain, diarrhea, nausea and vomiting.  Genitourinary: Negative for frequency and urgency.  Musculoskeletal: Negative for back pain and joint pain.  Neurological: Positive for dizziness and weakness. Negative for sensory change, speech change and focal weakness.  Psychiatric/Behavioral: Negative for depression and hallucinations. The patient is not nervous/anxious.      MEDICATIONS AT HOME:   Prior to Admission medications   Medication Sig Start Date End Date Taking? Authorizing Provider  acetaminophen (TYLENOL) 325 MG tablet Take 2 tablets (650 mg total) by mouth every 6 (six)  hours as needed for mild pain (headache). 01/15/15   Enid Baas, MD  alendronate (FOSAMAX) 70 MG tablet Take 70 mg by mouth once a week. Take with a full glass of water on an empty stomach.    [provider]  fluconazole (DIFLUCAN) 50 MG tablet Take 1 tablet (50 mg total) by mouth daily. 01/21/15   Jerald Kief, MD  fluticasone (FLONASE) 50 MCG/ACT nasal spray USE 2 SPRAYS IN NOSE DAILY AS DIRECTED 08/12/11   Swords, Valetta Mole, MD  gabapentin (NEURONTIN) 100 MG capsule Take 100 mg by mouth 3 (three) times daily.    [provider]  indomethacin (INDOCIN) 50 MG capsule Take 1 capsule (50 mg total) by mouth 3 (three) times daily as needed for moderate pain (for gout flares). 01/21/15   Jerald Kief, MD  mirtazapine (REMERON) 30 MG tablet Take 30 mg by mouth at bedtime.    [provider]  nystatin cream (MYCOSTATIN) Apply 1 application topically 2 (two) times daily.    [provider]  omeprazole (PRILOSEC) 40 MG capsule Take 40 mg by mouth daily.    [provider]  ondansetron (ZOFRAN) 4 MG tablet Take 1 tablet (4 mg total) by mouth every 6 (six) hours. As needed for nausea or vomiting 02/03/12   Bonk, John-Adam, MD  polyethylene glycol (MIRALAX / GLYCOLAX) packet Take 17 g by mouth daily as needed for mild constipation.     [provider]  predniSONE (DELTASONE) 20 MG tablet Take 2 tablets (40 mg total) by mouth daily with breakfast. 01/21/15   Jerald Kief, MD  traMADol (ULTRAM) 50 MG tablet Take 50 mg by mouth every 6 (six) hours as needed for moderate pain.     [provider]  vitamin B-12 (CYANOCOBALAMIN) 100 MCG tablet Take 1 tablet (100 mcg total) by mouth daily. 01/15/15   Enid Baas, MD  Vitamin D, Ergocalciferol, (DRISDOL) 50000 UNITS CAPS Take 50,000 Units by mouth every 30 (thirty) days.    [provider]  warfarin (COUMADIN) 1 MG tablet Take 1.5-2 mg by mouth daily. 1.5mg  by mouth on mondays.  All other days of the week take 1mg  tablet.    [provider]      VITAL SIGNS:  Blood pressure (!) 114/38, pulse (!) 47, temperature (!) 97.5 F (36.4 C), temperature source Oral, resp. rate 15, height 5\' 4"  (1.626 m), weight 65.8 kg (145 lb), SpO2 92 %.  PHYSICAL EXAMINATION:  GENERAL:  84 y.o.-year-old patient lying in the bed with no acute distress.  EYES: Pupils equal, round, reactive to light and accommodation. No scleral icterus. Extraocular muscles intact.  HEENT: Head atraumatic, normocephalic. Oropharynx and  nasopharynx clear.  NECK:  Supple, no jugular venous distention. No thyroid enlargement, no tenderness.  LUNGS: Normal breath sounds bilaterally, no wheezing, rales,rhonchi or crepitation. No use of accessory muscles of respiration.  CARDIOVASCULAR: S1, S2 normal. No murmurs, rubs, or gallops.  ABDOMEN: Soft, nontender, nondistended. Bowel sounds present. No organomegaly or mass.  EXTREMITIES: No pedal edema, cyanosis, or clubbing.  NEUROLOGIC: Cranial nerves II through XII are intact. Muscle strength 5/5 in all extremities. Sensation intact. Gait not checked.  PSYCHIATRIC: The patient is alert but sleepy SKIN: No obvious rash, lesion, or ulcer.   LABORATORY PANEL:   CBC Recent Labs  Lab 04/06/17 1632  WBC 18.9*  HGB 13.1  HCT 39.1  PLT 400   ------------------------------------------------------------------------------------------------------------------  Chemistries  Recent Labs  Lab 04/06/17 1632  NA  124*  K 4.7  CL 90*  CO2 22  GLUCOSE 170*  BUN 25*  CREATININE 1.61*  CALCIUM 9.6  AST 32  ALT 16  ALKPHOS 88  BILITOT 1.0   ------------------------------------------------------------------------------------------------------------------  Cardiac Enzymes Recent Labs  Lab 04/06/17 1632  TROPONINI <0.03   ------------------------------------------------------------------------------------------------------------------  RADIOLOGY:  Dg Chest Port 1 View  Result Date: 04/06/2017 CLINICAL DATA:  Weakness and shortness of breath EXAM: PORTABLE CHEST 1 VIEW COMPARISON:  January 19, 2015 FINDINGS: The heart size and mediastinal contours are stable. The aorta is tortuous. The heart size is enlarged. Both lungs are clear. The visualized skeletal structures are stable. IMPRESSION: No active cardiopulmonary disease. Electronically Signed   By: Sherian Rein M.D.   On: 04/06/2017 16:59    EKG:    IMPRESSION AND PLAN:   Quiara Killian  is a 84 y.o. female with a known  history of diverticulosis per CT and sigmoidectomy in the past with diverticulitis and fistula tract, COPD, CAD, chronic A. fib on oral anticoagulation with Eliquis comes to the emergency room from [facility] assisted living with diarrhea followed by bright red blood per rectum.  1.  Lower GI bleed/rectal bleeding suspected diverticular -Patient started having diarrhea with large amount of bright red blood per rectum.  She had episode 1 in the emergency room -Hemodynamically stable at present -Admit to medical floor.  Clear liquid diet -GI consultation -Hold oral anticoagulation--Eliquis -IV fluids -Type and cross keep blood transfusion ready.  Transfuse as needed -Check hemoglobin every 8 hourly  2.  Chronic A. fib heart rate stable -Continue home meds -Hold oral anticoagulation  3.  Mild confusion with possible UTI -Patient has elevated white count -Urinalysis pending -Consider starting IV Rocephin  4.  Hyponatremia/acute renal failure -IV fluids -Patient's baseline creatinine is 0.7 -Creatinine today is 1.6  5.  DVT prophylaxis SCD and teds  Was discussed with patient's daughter and granddaughter in the ER  All the records are reviewed and case discussed with ED provider. Management plans discussed with the patient, family and they are in agreement.  CODE STATUS: INR  TOTAL TIME TAKING CARE OF THIS PATIENT: 50 minutes.    Enedina Finner M.D on 04/06/2017 at 5:41 PM  Between 7am to 6pm - Pager - [phone]  After 6pm go to [email or web address] - password [email or web address] [facility]  [facility]  Office  [phone]  CC: Primary care physician; Karie Schwalbe, MD

## 2017-04-06 NOTE — ED Notes (Signed)
Admission MD at bedside with family and patient.

## 2017-04-06 NOTE — ED Provider Notes (Signed)
[facility] Emergency Department Provider Note   ____________________________________________   First MD Initiated Contact with Patient 04/06/17 1633     (approximate)  I have reviewed the triage vital signs and the nursing notes.   HISTORY  Chief Complaint Diarrhea  EM caveat: Patient disoriented, baseline dementia,  HPI [name] Cunningham is a 84 y.o. female presents for evaluation of fatigue, also possibly diarrhea.  Patient presents for evaluation of fatigue.  Also reportedly having loose stools and diarrhea today, EMS reports that [facility] nursing facility notify them that the patient had been acting fatigued, they been noticing diarrhea today.  She has not been acting her normal self and seems more fatigued than normal.  Thus sent for evaluation.     Past Medical History:  Diagnosis Date  . ANXIETY 11/21/2008  . Atrial fibrillation (HCC)   . CAD (coronary artery disease)    Mild by cath in remote past  . CHF (congestive heart failure) (HCC)   . COPD 01/06/2007  . DIVERTICULITIS OF COLON 07/23/2009  . Fistula   . GERD 01/06/2007  . HYPERLIPIDEMIA 01/06/2007  . HYPERTENSION 01/06/2007  . PAD (peripheral artery disease) (HCC)   . SLEEP APNEA 11/21/2008    Patient Active Problem List   Diagnosis Date Noted  . Acute gout of left foot 01/21/2015  . Fever, unspecified 01/20/2015  . Hypokalemia 01/19/2015  . Colitis 01/11/2015  . Diastolic CHF (HCC) 09/15/2011  . Atrial fibrillation (HCC) 09/15/2011  . PAD (peripheral artery disease) (HCC) 07/15/2011  . Edema of both legs 07/15/2011  . Palpitations 07/15/2011  . Back pain 01/28/2011  . TIA (transient ischemic attack) 06/11/2010  . TOBACCO USE 07/23/2009  . DIVERTICULITIS OF COLON 07/23/2009  . ANXIETY 11/21/2008  . SLEEP APNEA 11/21/2008  . Coronary atherosclerosis 01/04/2008  . PERIPHERAL VASCULAR DISEASE 01/04/2008  . HIATAL HERNIA 01/04/2008  . PULMONARY EMBOLISM, HX OF 01/04/2008    . DIABETES MELLITUS, TYPE II 01/17/2007  . Polymyalgia rheumatica (HCC) 01/12/2007  . HYPERLIPIDEMIA 01/06/2007  . Essential hypertension 01/06/2007  . COPD 01/06/2007  . GERD 01/06/2007    Past Surgical History:  Procedure Laterality Date  . ABDOMINAL HYSTERECTOMY    . APPENDECTOMY    . CATARACT EXTRACTION    . CHOLECYSTECTOMY    . colovaginal fistula repair     after partial colectomy  . GALLBLADDER SURGERY    . PARTIAL COLECTOMY     diverticulitis  . TEMPORAL ARTERY BIOPSY / LIGATION    . TONSILLECTOMY  84 YEARS OLD  . TUBAL LIGATION      Prior to Admission medications   Medication Sig Start Date End Date Taking? Authorizing Provider  acetaminophen (TYLENOL) 325 MG tablet Take 2 tablets (650 mg total) by mouth every 6 (six) hours as needed for mild pain (headache). 01/15/15   Enid Baas, MD  alendronate (FOSAMAX) 70 MG tablet Take 70 mg by mouth once a week. Take with a full glass of water on an empty stomach.    [provider]  fluconazole (DIFLUCAN) 50 MG tablet Take 1 tablet (50 mg total) by mouth daily. 01/21/15   Jerald Kief, MD  fluticasone (FLONASE) 50 MCG/ACT nasal spray USE 2 SPRAYS IN NOSE DAILY AS DIRECTED 08/12/11   Swords, Valetta Mole, MD  gabapentin (NEURONTIN) 100 MG capsule Take 100 mg by mouth 3 (three) times daily.    [provider]  indomethacin (INDOCIN) 50 MG capsule Take 1 capsule (50 mg total) by mouth  3 (three) times daily as needed for moderate pain (for gout flares). 01/21/15   Jerald Kiefhiu, Stephen K, MD  mirtazapine (REMERON) 30 MG tablet Take 30 mg by mouth at bedtime.    [provider]  nystatin cream (MYCOSTATIN) Apply 1 application topically 2 (two) times daily.    [provider]  omeprazole (PRILOSEC) 40 MG capsule Take 40 mg by mouth daily.    [provider]  ondansetron (ZOFRAN) 4 MG tablet Take 1 tablet (4 mg total) by mouth every 6 (six) hours. As needed for nausea or vomiting 02/03/12   Bonk,  John-Adam, MD  polyethylene glycol (MIRALAX / GLYCOLAX) packet Take 17 g by mouth daily as needed for mild constipation.     [provider]  predniSONE (DELTASONE) 20 MG tablet Take 2 tablets (40 mg total) by mouth daily with breakfast. 01/21/15   Jerald Kiefhiu, Stephen K, MD  traMADol (ULTRAM) 50 MG tablet Take 50 mg by mouth every 6 (six) hours as needed for moderate pain.     [provider]  vitamin B-12 (CYANOCOBALAMIN) 100 MCG tablet Take 1 tablet (100 mcg total) by mouth daily. 01/15/15   Enid BaasKalisetti, Radhika, MD  Vitamin D, Ergocalciferol, (DRISDOL) 50000 UNITS CAPS Take 50,000 Units by mouth every 30 (thirty) days.    [provider]  warfarin (COUMADIN) 1 MG tablet Take 1.5-2 mg by mouth daily. 1.5mg  by mouth on mondays.  All other days of the week take 1mg  tablet.    [provider]    Allergies Hydroxyzine hcl  Family History  Problem Relation Age of Onset  . Heart disease Father   . Asthma Father   . Cancer Mother   . Diabetes Daughter   . Colon cancer Son     Social History Social History   Tobacco Use  . Smoking status: Former Smoker    Types: Cigarettes  . Smokeless tobacco: Never Used  Substance Use Topics  . Alcohol use: No    Alcohol/week: 0.0 oz  . Drug use: No    Review of Systems Patient unable to provide much history, just reports she feels lousy.  Denies being in pain.  Denies abdominal pain.  No nausea.  No chest pain.  Denies trouble breathing.  No headache.  ____________________________________________   PHYSICAL EXAM:  VITAL SIGNS: ED Triage Vitals  Enc Vitals Group     BP 04/06/17 1631 (!) 114/38     Pulse Rate 04/06/17 1631 (!) 47     Resp 04/06/17 1631 15     Temp 04/06/17 1631 (!) 97.5 F (36.4 C)     Temp Source 04/06/17 1631 Oral     SpO2 04/06/17 1631 92 %     Weight 04/06/17 1630 145 lb (65.8 kg)     Height 04/06/17 1630 5\' 4"  (1.626 m)     Head Circumference --      Peak Flow --      Pain Score --        Pain Loc --      Pain Edu? --      Excl. in GC? --     Constitutional: Alert and oriented to self and place but not to date or time.  Fatigued but in no distress.  Eyes: Conjunctivae are normal. Head: Atraumatic. Nose: No congestion/rhinnorhea. Mouth/Throat: Mucous membranes are moist. Neck: No stridor.   Cardiovascular: Normal rate, regular rhythm. Grossly normal heart sounds.  Good peripheral circulation. Respiratory: Normal respiratory effort.  No retractions. Lungs CTAB.  Gastrointestinal: Soft and nontender. No distention.  Grossly bloody stool, hematochezia.  No tenderness or pain to palpation throughout the abdomen.  No peritonitis.  No rebound or guarding in any quadrant. Musculoskeletal: No lower extremity tenderness nor edema. Neurologic:  Normal speech and language. No gross focal neurologic deficits are appreciated.  Skin:  Skin is warm, dry and intact. No rash noted. Psychiatric: Mood and affect are normal. Speech and behavior are normal.  ____________________________________________   LABS (all labs ordered are listed, but only abnormal results are displayed)  Labs Reviewed  LACTIC ACID, PLASMA - Abnormal; Notable for the following components:      Result Value   Lactic Acid, Venous 2.9 (*)    All other components within normal limits  COMPREHENSIVE METABOLIC PANEL - Abnormal; Notable for the following components:   Sodium 124 (*)    Chloride 90 (*)    Glucose, Bld 170 (*)    BUN 25 (*)    Creatinine, Ser 1.61 (*)    GFR calc non Af Amer 28 (*)    GFR calc Af Amer 32 (*)    All other components within normal limits  CBC WITH DIFFERENTIAL/PLATELET - Abnormal; Notable for the following components:   WBC 18.9 (*)    Neutro Abs 17.0 (*)    All other components within normal limits  PROTIME-INR - Abnormal; Notable for the following components:   Prothrombin Time 17.8 (*)    All other components within normal limits  URINE CULTURE  BLOOD GAS, VENOUS  LIPASE,  BLOOD  TROPONIN I  URINALYSIS, ROUTINE W REFLEX MICROSCOPIC  APTT  TYPE AND SCREEN   ____________________________________________  EKG  Reviewed interrupt me at 1630 Heart rate 45 QRS 70 QTC 420 Probable atrial fibrillation with somewhat slow response versus junctional rhythm.  No ischemic changes noted ____________________________________________  RADIOLOGY   ____________________________________________   PROCEDURES  Procedure(s) performed: None  Procedures  Critical Care performed: No  ____________________________________________   INITIAL IMPRESSION / ASSESSMENT AND PLAN / ED COURSE  Pertinent labs & imaging results that were available during my care of the patient were reviewed by me and considered in my medical decision making (see chart for details).  Patient presents for evaluation of fatigue weakness and reported history of diarrhea found to have hematochezia.  Denies associated abdominal pain, afebrile, no peritonitis on exam arguing against acute diverticulitis or acute intra-abdominal infection.  Broad differential, family reports a history of diverticulosis in the past and she is anticoagulated certainly providing a possible source for bleeding.  Family at bedside, they affirmed the patient is DO NOT RESUSCITATE.  She has a DO NOT RESUSCITATE from her primary care doctor as well no acute neurologic symptoms.  No focal deficits.  Symptoms likely related to a GI bleeding, feel less likely infection but continue to check for signs of infection including urinalysis etc.  Clinical Course as of Apr 07 1739  Tue Apr 06, 2017  1701 Patient just had a moderate bowel movement, noted to be grossly bloody with mixed stool and hematochezia.  I have called and spoken with Dr. Beckey Downing of GI, we will wait anticoagulants, consider reversal if elevated there are mixed notes that indicate the patient has previously been on Coumadin, but likely last transition to Eliquis.  The  last dose of Eliquis is however unknown at this time.  To be stable, alert, fatigued, but in no distress.  Gastroenterology recommends admission to the hospitalist service and they will see patient in consult  this afternoon  [MQ]  1702 WBC: (!) 18.9 [MQ]  1702 Platelets: 400 [MQ]  1702 Hemoglobin: 13.1 [MQ]    Clinical Course User Index [MQ] Sharyn Creamer, MD   ----------------------------------------- 5:41 PM on 04/06/2017 -----------------------------------------  Patient be admitted to the hospitalist service with GI planning to consult on the patient this afternoon.  At this point, patient appears hemodynamically stable for ongoing inpatient evaluation.  ____________________________________________   FINAL CLINICAL IMPRESSION(S) / ED DIAGNOSES  Final diagnoses:  Hematochezia      NEW MEDICATIONS STARTED DURING THIS VISIT:  This SmartLink is deprecated. Use AVSMEDLIST instead to display the medication list for a patient.   Note:  This document was prepared using Dragon voice recognition software and may include unintentional dictation errors.     Sharyn Creamer, MD 04/06/17 1742

## 2017-04-06 NOTE — Progress Notes (Signed)
Patient ID: Almon RegisterMargaret T Cunningham, female   DOB: 07/17/1928, 84 y.o.   MRN: [id number]  ACP family meeting note  Patient has a living will.  Came in from [facility] with rectal bleed.  She is hemostatically stable at present.  Has history of diverticulosis and history of diverticulitis with sigmoidectomy in the past.  CODE STATUS discussed with patient's daughter and.  Patient is a DNR carries an out of facility DNR form.  Time 16 mins

## 2017-04-07 ENCOUNTER — Other Ambulatory Visit: Payer: Self-pay

## 2017-04-07 DIAGNOSIS — K625 Hemorrhage of anus and rectum: Secondary | ICD-10-CM

## 2017-04-07 DIAGNOSIS — K921 Melena: Secondary | ICD-10-CM

## 2017-04-07 LAB — BASIC METABOLIC PANEL
Anion gap: 8 (ref 5–15)
BUN: 26 mg/dL — AB (ref 6–20)
CHLORIDE: 96 mmol/L — AB (ref 101–111)
CO2: 23 mmol/L (ref 22–32)
Calcium: 8.7 mg/dL — ABNORMAL LOW (ref 8.9–10.3)
Creatinine, Ser: 1.14 mg/dL — ABNORMAL HIGH (ref 0.44–1.00)
GFR calc Af Amer: 49 mL/min — ABNORMAL LOW (ref >60)
GFR calc non Af Amer: 42 mL/min — ABNORMAL LOW (ref >60)
GLUCOSE: 164 mg/dL — AB (ref 65–99)
POTASSIUM: 5 mmol/L (ref 3.5–5.1)
Sodium: 127 mmol/L — ABNORMAL LOW (ref 135–145)

## 2017-04-07 LAB — MRSA PCR SCREENING: MRSA by PCR: NEGATIVE

## 2017-04-07 LAB — HEMOGLOBIN
HEMOGLOBIN: 11.9 g/dL — AB (ref 12.0–16.0)
HEMOGLOBIN: 11.3 g/dL — AB (ref 12.0–16.0)
Hemoglobin: 11.3 g/dL — ABNORMAL LOW (ref 12.0–16.0)

## 2017-04-07 LAB — LACTIC ACID, PLASMA: Lactic Acid, Venous: 2.3 mmol/L (ref 0.5–1.9)

## 2017-04-07 MED ORDER — CEPHALEXIN 250 MG/5ML PO SUSR
500.0000 mg | Freq: Two times a day (BID) | ORAL | Status: DC
  Administered 2017-04-07 – 2017-04-09 (×4): 500 mg via ORAL
  Filled 2017-04-07 (×5): qty 10

## 2017-04-07 NOTE — Progress Notes (Signed)
PHARMACY NOTE -  ANTIBIOTIC RENAL DOSE ADJUSTMENT   Request received for Pharmacy to assist with antibiotic renal dose adjustment.   Patient has been initiated on cephalexin 500mg   Twice daily.  SCr 1.61, estimated CrCl 23.4 ml/min Now improved to SCr 1.14, CrCl 33 ml/min  Based on current renal function, will adjust dose back to cephalexin 500 mg twice daily.   Marty HeckWang, Cinderella Christoffersen L, PharmD, BCPS Clinical Pharmacist 04/07/2017 2:40 PM

## 2017-04-07 NOTE — Evaluation (Signed)
Physical Therapy Evaluation Patient Details Name: Almon RegisterMargaret T Rabine MRN: [id number] DOB: 05/21/1928 Today's Date: 04/07/2017   History of Present Illness  Pt is an 84 y.o. female presenting to hospital with fatigue, diarrhea, and BRBPR.  Pt admitted with lower GI bleed/rectal bleeding (suspected diverticular).  PMH includes dementia, anxiety, a-fib, CAD, CHF, COPD, htn, hiatal hernia, h/o PE, polymyalgia rheumatica.  Clinical Impression  Prior to hospital admission, per daughter pt was SBA with w/c level transfers and could propel self via LE's in w/c.  Pt lives at [facility] per pt's daughter.  Currently pt is mod to max assist with bed mobility.  Pt sleeping upon arrival (pt's daughter reports pt was awake earlier eating breakfast) and requiring vc's and tactile cues to wake up.  Upon sitting on edge of bed, pt fell back asleep and pt assisted back to laying down in bed.  After laying down in bed pt intermittently briefly woken and interactive before falling back asleep.  Pt moved fairly well when awake but mobility limited d/t lethargy.  Overall pt appearing with generalized weakness.  Pt would benefit from skilled PT to address noted impairments and functional limitations (see below for any additional details).  Upon hospital discharge, recommend pt discharge back to facility with 24/7 assist and HHPT.    Follow Up Recommendations Supervision/Assistance - 24 hour(DC to pt's current facility with HHPT)    Equipment Recommendations       Recommendations for Other Services       Precautions / Restrictions Precautions Precautions: Fall Restrictions Weight Bearing Restrictions: No      Mobility  Bed Mobility Overal bed mobility: Needs Assistance Bed Mobility: Supine to Sit;Sit to Supine     Supine to sit: Mod assist Sit to supine: Max assist   General bed mobility comments: cueing for LE's and assist for trunk supine to sit; pt laid down onto bed with max assist for safety (pt  appeared to have fallen asleep sitting edge of bed)  Transfers                 General transfer comment: Deferred (see above)  Ambulation/Gait                Stairs            Wheelchair Mobility    Modified Rankin (Stroke Patients Only)       Balance Overall balance assessment: Needs assistance Sitting-balance support: Bilateral upper extremity supported;Feet supported Sitting balance-Leahy Scale: Poor Sitting balance - Comments: Pt briefly close SBA but most CGA to mod assist (d/t pt unable to stay awake sitting edge of bed)                                     Pertinent Vitals/Pain Pain Assessment: Faces Faces Pain Scale: No hurt(4/10 sitting edge of bed (c/o pain in LE's; pt's daughter reports pt has chronic back pain) but 0/10 at rest beginning/end of session) Pain Location: B LE's Pain Descriptors / Indicators: Sore Pain Intervention(s): Limited activity within patient's tolerance;Monitored during session;Repositioned  Vitals (HR and O2 on room air) stable and WFL throughout treatment session.    Home Living Family/patient expects to be discharged to:: Skilled nursing facility                 Additional Comments: Per pt's daughter Dewayne Hatch(Ann), pt lives at [facility].    Prior Function  Level of Independence: Needs assistance   Gait / Transfers Assistance Needed: Pt's daughter reports pt transfers from bed to w/c to toilet with SBA (does not use a walker).  Pt's daughter does not think pt has ambulated recently (reports [facility] took pt off of walker) but does propel self in w/c via LE's.           Hand Dominance        Extremity/Trunk Assessment   Upper Extremity Assessment Upper Extremity Assessment: Generalized weakness    Lower Extremity Assessment Lower Extremity Assessment: Generalized weakness       Communication   Communication: No difficulties  Cognition Arousal/Alertness: Lethargic Behavior During  Therapy: (Sleepy) Overall Cognitive Status: History of cognitive impairments - at baseline                                        General Comments   Nursing cleared pt for participation in physical therapy.  Pt and pt's daughter agreeable to PT session.    Exercises     Assessment/Plan    PT Assessment Patient needs continued PT services  PT Problem List Decreased strength;Decreased activity tolerance;Decreased balance;Decreased mobility       PT Treatment Interventions DME instruction;Gait training;Functional mobility training;Therapeutic activities;Therapeutic exercise;Balance training;Patient/family education    PT Goals (Current goals can be found in the Care Plan section)  Acute Rehab PT Goals Patient Stated Goal: to improve strength PT Goal Formulation: With patient/family Time For Goal Achievement: 04/21/17 Potential to Achieve Goals: Good    Frequency Min 2X/week   Barriers to discharge        Co-evaluation               AM-PAC PT "6 Clicks" Daily Activity  Outcome Measure Difficulty turning over in bed (including adjusting bedclothes, sheets and blankets)?: Unable Difficulty moving from lying on back to sitting on the side of the bed? : Unable Difficulty sitting down on and standing up from a chair with arms (e.g., wheelchair, bedside commode, etc,.)?: Unable Help needed moving to and from a bed to chair (including a wheelchair)?: Total Help needed walking in hospital room?: Total Help needed climbing 3-5 steps with a railing? : Total 6 Click Score: 6    End of Session   Activity Tolerance: Patient limited by lethargy Patient left: in bed;with call bell/phone within reach;with bed alarm set;with family/visitor present;with SCD's reapplied Nurse Communication: Mobility status;Precautions;Other (comment)(Pt's lethargy/ability to participate in PT) PT Visit Diagnosis: Other abnormalities of gait and mobility (R26.89);Muscle weakness  (generalized) (M62.81)    Time: 1610-9604 PT Time Calculation (min) (ACUTE ONLY): 25 min   Charges:   PT Evaluation $PT Eval Low Complexity: 1 Low     PT G Codes:   PT G-Codes **NOT FOR INPATIENT CLASS** Functional Assessment Tool Used: AM-PAC 6 Clicks Basic Mobility Functional Limitation: Mobility: Walking and moving around Mobility: Walking and Moving Around Current Status (V4098): 100 percent impaired, limited or restricted Mobility: Walking and Moving Around Goal Status (J1914): At least 40 percent but less than 60 percent impaired, limited or restricted    Hendricks Limes, PT 04/07/17, 10:00 AM [phone]

## 2017-04-07 NOTE — Consult Note (Signed)
Midge Minium, MD [facility]  [address], [address] [place], [place] [address] Phone: [phone] Fax : [phone]  Consultation  Referring Provider:     Dr. Allena Katz Primary Care Physician:  Karie Schwalbe, MD Primary Gastroenterologist: Gentry Fitz         Reason for Consultation:     Hematochezia  Date of Admission:  04/06/2017 Date of Consultation:  04/07/2017         HPI:   [name] Cunningham is a 84 y.o. female with a history of diverticulosis and a resection for diverticulitis of her colon in the past.  The patient was admitted with a presumed lower GI bleed.  The patient was having bright red blood per rectum.  The patient has a history of chronic A. fib on anticoagulation with Eliquis and coronary artery disease.  The patient was at [facility] assisted living and was brought in with bright red blood per rectum.  The patient's last colonoscopy was in 2009.  She did have a hospitalization in 2016 that was suggestive on a CT scan of proctitis but no intervention was taken at that time due to lack of symptoms.  The patient's hemoglobin on admission was 13.1 which was 12.2 later that day and is 11.3 today.  The patient has not had any abdominal pain nausea vomiting fevers or chills.  She has been lethargic for unknown reasons.  Past Medical History:  Diagnosis Date  . ANXIETY 11/21/2008  . Atrial fibrillation (HCC)   . CAD (coronary artery disease)    Mild by cath in remote past  . CHF (congestive heart failure) (HCC)   . COPD 01/06/2007  . DIVERTICULITIS OF COLON 07/23/2009  . Fistula   . GERD 01/06/2007  . HYPERLIPIDEMIA 01/06/2007  . HYPERTENSION 01/06/2007  . PAD (peripheral artery disease) (HCC)   . SLEEP APNEA 11/21/2008    Past Surgical History:  Procedure Laterality Date  . ABDOMINAL HYSTERECTOMY    . APPENDECTOMY    . CATARACT EXTRACTION    . CHOLECYSTECTOMY    . colovaginal fistula repair     after partial colectomy  . GALLBLADDER SURGERY    . PARTIAL COLECTOMY     diverticulitis  . TEMPORAL ARTERY BIOPSY / LIGATION    . TONSILLECTOMY  84 YEARS OLD  . TUBAL LIGATION      Prior to Admission medications   Medication Sig Start Date End Date Taking? Authorizing Provider  acetaminophen (TYLENOL) 500 MG tablet Take 1,000 mg by mouth 3 (three) times daily as needed.   Yes [provider]  albuterol (ACCUNEB) 1.25 MG/3ML nebulizer solution Take 3 mLs by nebulization once as needed for wheezing.   Yes [provider]  albuterol (PROVENTIL) (2.5 MG/3ML) 0.083% nebulizer solution Take 2.5 mg by nebulization every 6 (six) hours as needed for wheezing or shortness of breath.   Yes [provider]  apixaban (ELIQUIS) 5 MG TABS tablet Take 5 mg by mouth 2 (two) times daily.   Yes [provider]  carboxymethylcellulose (REFRESH PLUS) 0.5 % SOLN Place 1 drop into both eyes as needed.   Yes [provider]  colchicine 0.6 MG tablet Take 0.3 mg by mouth daily.   Yes [provider]  DIAPER RASH PRODUCTS EX Apply 1 application topically 3 (three) times daily as needed.   Yes [provider]  diltiazem (CARDIZEM CD) 240 MG 24 hr capsule Take 240 mg by mouth daily.   Yes [provider]  furosemide (LASIX)  40 MG tablet Take 40 mg by mouth daily as needed for fluid. Hold if weight <140lb   Yes [provider]  gabapentin (NEURONTIN) 100 MG capsule Take 100 mg by mouth 2 (two) times daily.    Yes [provider]  lisinopril (PRINIVIL,ZESTRIL) 20 MG tablet Take 20 mg by mouth daily.   Yes [provider]  loperamide (IMODIUM) 2 MG capsule Take 2-4 mg by mouth. Take 2 capsules (4mg ) by mouth at first sign of loose stool then take 1 capsule (2mg ) after each additional loose stool (max 2 doses daily)   Yes [provider]  magnesium hydroxide (MILK OF MAGNESIA) 800 MG/5ML suspension Take 30 mLs by mouth 2 (two) times daily as needed for constipation.   Yes [provider]  magnesium oxide (MAG-OX) 400 MG tablet Take 400 mg by mouth daily.   Yes [provider]  Melatonin 3 MG TABS Take 3 mg by mouth at bedtime.   Yes [provider]  nystatin (MYCOSTATIN/NYSTOP) powder Apply 1 application topically 2 (two) times daily.   Yes [provider]  omeprazole (PRILOSEC) 40 MG capsule Take 40 mg by mouth daily with breakfast.    Yes [provider]  ondansetron (ZOFRAN) 4 MG tablet Take 1 tablet (4 mg total) by mouth every 6 (six) hours. As needed for nausea or vomiting Patient taking differently: Take 4 mg by mouth every 8 (eight) hours as needed for nausea or vomiting.  02/03/12  Yes Bonk, John-Adam, MD  traMADol (ULTRAM) 50 MG tablet Take 50 mg by mouth every 8 (eight) hours as needed for moderate pain.    Yes [provider]  vitamin B-12 (CYANOCOBALAMIN) 100 MCG tablet Take 1 tablet (100 mcg total) by mouth daily. 01/15/15  Yes Enid Baas, MD  Vitamin D, Ergocalciferol, (DRISDOL) 50000 UNITS CAPS Take 50,000 Units by mouth every 30 (thirty) days.   Yes [provider]  acetaminophen (TYLENOL) 325 MG tablet Take 2 tablets (650 mg total) by mouth every 6 (six) hours as needed for mild pain (headache). Patient not taking: Reported on 04/06/2017 01/15/15   Enid Baas, MD    Family History  Problem Relation Age of Onset  . Heart disease Father   . Asthma Father   . Cancer Mother   . Diabetes Daughter   . Colon cancer Son      Social History   Tobacco Use  . Smoking status: Former Smoker    Types: Cigarettes  . Smokeless tobacco: Never Used  Substance Use Topics  . Alcohol use: No    Alcohol/week: 0.0 oz  . Drug use: No    Allergies as of 04/06/2017 - Review Complete 04/06/2017  Allergen Reaction Noted  . Hydroxyzine hcl Other (See Comments) 04/19/2009    Review of Systems:    All systems reviewed and negative except where noted in HPI.   Physical Exam:  Vital signs in last 24  hours: Temp:  [97.5 F (36.4 C)-98.9 F (37.2 C)] 98.6 F (37 C) (01/02 0547) Pulse Rate:  [47-95] 95 (01/02 1224) Resp:  [15-20] 20 (01/02 0547) BP: (98-152)/(38-74) 123/49 (01/02 1224) SpO2:  [90 %-97 %] 90 % (01/02 1224) Weight:  [145 lb (65.8 kg)] 145 lb (65.8 kg) (01/01 1630) Last BM Date: 04/06/17 General:   Pleasant, cooperative in NAD Head:  Normocephalic and atraumatic. Eyes:   No icterus.   Conjunctiva pink. PERRLA. Ears:  Normal auditory acuity. Neck:  Supple; no masses or thyroidomegaly Lungs:  Respirations even and unlabored. Lungs clear to auscultation bilaterally.   No wheezes, crackles, or rhonchi.  Heart:  Regular rate and rhythm;  Without murmur, clicks, rubs or gallops Abdomen:  Soft, nondistended, nontender. Normal bowel sounds. No appreciable masses or hepatomegaly.  No rebound or guarding.  Rectal:  Not performed. Msk:  Symmetrical without gross deformities.    Extremities:  Without edema, cyanosis or clubbing. Neurologic: Lethargic  Skin:  Intact without significant lesions or rashes. Cervical Nodes:  No significant cervical adenopathy. Psych:  Alert and cooperative. Normal affect.  LAB RESULTS: Recent Labs    04/06/17 1632 04/06/17 1931 04/07/17 0332 04/07/17 1032  WBC 18.9*  --   --   --   HGB 13.1 12.2 11.9* 11.3*  HCT 39.1  --   --   --   PLT 400  --   --   --    BMET Recent Labs    04/06/17 1632 04/07/17 1032  NA 124* 127*  K 4.7 5.0  CL 90* 96*  CO2 22 23  GLUCOSE 170* 164*  BUN 25* 26*  CREATININE 1.61* 1.14*  CALCIUM 9.6 8.7*   LFT Recent Labs    04/06/17 1632  PROT 7.7  ALBUMIN 4.3  AST 32  ALT 16  ALKPHOS 88  BILITOT 1.0   PT/INR Recent Labs    04/06/17 1705  LABPROT 17.8*  INR 1.48    STUDIES: Dg Chest Port 1 View  Result Date: 04/06/2017 CLINICAL DATA:  Weakness and shortness of breath EXAM: PORTABLE CHEST 1 VIEW COMPARISON:  January 19, 2015 FINDINGS: The heart size and mediastinal contours are stable. The  aorta is tortuous. The heart size is enlarged. Both lungs are clear. The visualized skeletal structures are stable. IMPRESSION: No active cardiopulmonary disease. Electronically Signed   By: Sherian ReinWei-Chen  Lin M.D.   On: 04/06/2017 16:59      Impression / Plan:   [name] Cunningham is a 84 y.o. y/o female with rectal bleeding.  The patient's hemoglobin has remained stable.  The patient's family has been spoken to about possible interventions including colonoscopy.  The family states that they would not want her to undergo any invasive procedures.  The patient likely has diverticular disease with bleeding from that versus a neoplasm.  The family states that they would not want surgery for cancer or resection of diverticular bleeding.  If the patient should bleed again I would consider vascular surgery intervention with angiography as a possibility.  Nothing further to do from a GI point of view. I will sign off.  Please call if any further GI concerns or questions.  We would like to thank you for the opportunity to participate in the care of [name] Cunningham.    Thank you for involving me in the care of this patient.      LOS: 1 day   Midge Miniumarren Serenah Mill, MD  04/07/2017, 1:03 PM   Note: This dictation was prepared with Dragon dictation along with smaller phrase technology. Any transcriptional errors that result from this process are unintentional.

## 2017-04-07 NOTE — Care Management (Signed)
Patient from [facility]. CSW aware.  RNCM signing off.

## 2017-04-07 NOTE — NC FL2 (Signed)
[place] MEDICAID FL2 LEVEL OF CARE SCREENING TOOL     IDENTIFICATION  Patient Name: SHAYLIN BLATT Birthdate: 11/26/1928 Sex: female Admission Date (Current Location): 04/06/2017  [facility] and [place] Number:  Chiropodist and Address:  [facility], [address] [place], [place] [address]      Provider Number: [id number]  Attending Physician Name and Address:  Shaune Pollack, MD  Relative Name and Phone Number:       Current Level of Care: Hospital Recommended Level of Care: Skilled Nursing Facility Prior Approval Number:    Date Approved/Denied:   PASRR Number:    Discharge Plan: SNF    Current Diagnoses: Patient Active Problem List   Diagnosis Date Noted  . Hematochezia   . Rectal bleeding 04/06/2017  . Acute gout of left foot 01/21/2015  . Fever, unspecified 01/20/2015  . Hypokalemia 01/19/2015  . Colitis 01/11/2015  . Diastolic CHF (HCC) 09/15/2011  . Atrial fibrillation (HCC) 09/15/2011  . PAD (peripheral artery disease) (HCC) 07/15/2011  . Edema of both legs 07/15/2011  . Palpitations 07/15/2011  . Back pain 01/28/2011  . TIA (transient ischemic attack) 06/11/2010  . TOBACCO USE 07/23/2009  . DIVERTICULITIS OF COLON 07/23/2009  . ANXIETY 11/21/2008  . SLEEP APNEA 11/21/2008  . Coronary atherosclerosis 01/04/2008  . PERIPHERAL VASCULAR DISEASE 01/04/2008  . HIATAL HERNIA 01/04/2008  . PULMONARY EMBOLISM, HX OF 01/04/2008  . DIABETES MELLITUS, TYPE II 01/17/2007  . Polymyalgia rheumatica (HCC) 01/12/2007  . HYPERLIPIDEMIA 01/06/2007  . Essential hypertension 01/06/2007  . COPD 01/06/2007  . GERD 01/06/2007    Orientation RESPIRATION BLADDER Height & Weight     Self, Place  Normal Incontinent Weight: 145 lb (65.8 kg) Height:  5\' 4"  (162.6 cm)  BEHAVIORAL SYMPTOMS/MOOD NEUROLOGICAL BOWEL NUTRITION STATUS  (none) (none) Incontinent Diet(clears but to be advanced)  AMBULATORY STATUS COMMUNICATION OF NEEDS Skin    Extensive Assist Verbally Normal                       Personal Care Assistance Level of Assistance  Dressing, Feeding, Bathing Bathing Assistance: Maximum assistance Feeding assistance: Maximum assistance Dressing Assistance: Maximum assistance     Functional Limitations Info  (no issues)          SPECIAL CARE FACTORS FREQUENCY  PT (By licensed PT)                    Contractures Contractures Info: Not present    Additional Factors Info  Code Status Code Status Info: dnr             Current Medications (04/07/2017):  This is the current hospital active medication list Current Facility-Administered Medications  Medication Dose Route Frequency Provider Last Rate Last Dose  . acetaminophen (TYLENOL) tablet 650 mg  650 mg Oral Q6H PRN Enedina Finner, MD   650 mg at 04/07/17 0746   Or  . acetaminophen (TYLENOL) suppository 650 mg  650 mg Rectal Q6H PRN Enedina Finner, MD      . cephALEXin (KEFLEX) 250 MG/5ML suspension 250 mg  250 mg Oral Q12H Enedina Finner, MD   250 mg at 04/07/17 1043  . ondansetron (ZOFRAN) tablet 4 mg  4 mg Oral Q6H PRN Enedina Finner, MD       Or  . ondansetron [facility]) injection 4 mg  4 mg Intravenous Q6H PRN Enedina Finner, MD      . polyethylene glycol (MIRALAX / GLYCOLAX) packet 17  g  17 g Oral Daily PRN Enedina FinnerPatel, Sona, MD         Discharge Medications: Please see discharge summary for a list of discharge medications.  Relevant Imaging Results:  Relevant Lab Results:   Additional Information    York SpanielMonica Johnny Gorter, LCSW

## 2017-04-07 NOTE — Clinical Social Work Note (Signed)
Clinical Social Work Assessment  Patient Details  Name: [name] Cunningham MRN: [id number] Date of Birth: 09/05/1928  Date of referral:  04/07/17               Reason for consult:  Discharge Planning                Permission sought to share information with:  Facility Medical sales representativeContact Representative, Family Supports Permission granted to share information::  Yes, Verbal Permission Granted  Name::        Agency::     Relationship::     Contact Information:     Housing/Transportation Living arrangements for the past 2 months:    Source of Information:  Adult Children Patient Interpreter Needed:  None Criminal Activity/Legal Involvement Pertinent to Current Situation/Hospitalization:  No - Comment as needed Significant Relationships:  Adult Children Lives with:  Facility Resident Do you feel safe going back to the place where you live?  Yes Need for family participation in patient care:  Yes (Comment)  Care giving concerns:  Patient resides as long term care at [facility].   Social Worker assessment / plan:  CSW spoke with patient and her son this afternoon and the son wishes for patient to return to [facility] when time. He stated that he will make a decision regarding how she will transport on the day of discharge. He stated that patient had much difficulty sitting up today and CSW stated that it might be best to go via EMS but that we will revisit it on day of discharge. [name] at [facility] states patient can return.   Employment status:  Retired Health and safety inspectornsurance information:  Medicare PT Recommendations:    Information / Referral to community resources:     Patient/Family's Response to care:  Patient was joking and in a good mood this afternoon. Patient's son expressed appreciation for CSW assistance.  Patient/Family's Understanding of and Emotional Response to Diagnosis, Current Treatment, and Prognosis:  Patient wishes to return as soon as she can to [facility] and patient's son is  hopeful it might be tomorrow.   Emotional Assessment Appearance:  Appears stated age Attitude/Demeanor/Rapport:  (pleasant but intermittently confused) Affect (typically observed):    Orientation:  Oriented to Self, Fluctuating Orientation (Suspected and/or reported Sundowners) Alcohol / Substance use:  Not Applicable Psych involvement (Current and /or in the community):  No (Comment)  Discharge Needs  Concerns to be addressed:  Care Coordination Readmission within the last 30 days:  No Current discharge risk:  None Barriers to Discharge:  No Barriers Identified   York SpanielMonica Shiva Karis, LCSW 04/07/2017, 4:17 PM

## 2017-04-07 NOTE — Progress Notes (Signed)
Notified Dr. Imogene Burnhen via text page and verba;that [phone]'s son HCPOA Brett Canales(Steve)  still wants to talk to the doctor.

## 2017-04-07 NOTE — Progress Notes (Signed)
[facility] - [place] at [facility]   PATIENT NAME: [name] Cunningham    MR#:  [id number]  DATE OF BIRTH:  07/24/1928  SUBJECTIVE:  CHIEF COMPLAINT:   Chief Complaint  Patient presents with  . Diarrhea   And is confused, unable to answer questions. REVIEW OF SYSTEMS:  Review of Systems  Unable to perform ROS: Mental status change    DRUG ALLERGIES:   Allergies  Allergen Reactions  . Hydroxyzine Hcl Other (See Comments)    Makes patient feel like she does not want to live   VITALS:  Blood pressure (!) 123/49, pulse 95, temperature 98.6 F (37 C), temperature source Oral, resp. rate 20, height 5\' 4"  (1.626 m), weight 145 lb (65.8 kg), SpO2 90 %. PHYSICAL EXAMINATION:  Physical Exam  Constitutional: She is well-developed, well-nourished, and in no distress.  HENT:  Head: Normocephalic.  Eyes: Conjunctivae and EOM are normal. Pupils are equal, round, and reactive to light. No scleral icterus.  Neck: Normal range of motion. Neck supple. No JVD present. No tracheal deviation present.  Cardiovascular: Normal rate, regular rhythm and normal heart sounds. Exam reveals no gallop.  No murmur heard. Pulmonary/Chest: Effort normal and breath sounds normal. No respiratory distress. She has no wheezes. She has no rales.  Abdominal: Soft. Bowel sounds are normal. She exhibits no distension. There is no tenderness. There is no rebound.  Musculoskeletal: She exhibits no edema or tenderness.  Neurological:  Unable to exam.  Skin: No rash noted. No erythema.  Psychiatric:  Patient is confused.   LABORATORY PANEL:  Female CBC Recent Labs  Lab 04/06/17 1632  04/07/17 1032  WBC 18.9*  --   --   HGB 13.1   < > 11.3*  HCT 39.1  --   --   PLT 400  --   --    < > = values in this interval not displayed.   ------------------------------------------------------------------------------------------------------------------ Chemistries  Recent Labs  Lab 04/06/17 1632  04/07/17 1032  NA 124* 127*  K 4.7 5.0  CL 90* 96*  CO2 22 23  GLUCOSE 170* 164*  BUN 25* 26*  CREATININE 1.61* 1.14*  CALCIUM 9.6 8.7*  AST 32  --   ALT 16  --   ALKPHOS 88  --   BILITOT 1.0  --    RADIOLOGY:  Dg Chest Port 1 View  Result Date: 04/06/2017 CLINICAL DATA:  Weakness and shortness of breath EXAM: PORTABLE CHEST 1 VIEW COMPARISON:  January 19, 2015 FINDINGS: The heart size and mediastinal contours are stable. The aorta is tortuous. The heart size is enlarged. Both lungs are clear. The visualized skeletal structures are stable. IMPRESSION: No active cardiopulmonary disease. Electronically Signed   By: Sherian ReinWei-Chapel Silverthorn  Lin M.D.   On: 04/06/2017 16:59   ASSESSMENT AND PLAN:   [name] Cunningham  is a 84 y.o. female with a known history of diverticulosis per CT and sigmoidectomy in the past with diverticulitis and fistula tract, COPD, CAD, chronic A. fib on oral anticoagulation with Eliquis comes to the emergency room from [facility] assisted living with diarrhea followed by bright red blood per rectum.  1.  Lower GI bleed/rectal bleeding suspected diverticular Hold Eliquis and follow-up hemoglobin every 8 hours. The family states that they would not want her to undergo any invasive procedures per Dr. Servando SnareWohl.    2.  Chronic A. fib heart rate stable -Continue home meds -Hold oral anticoagulation  3.    Acute metabolic encephalopathy, due to  UTI, renal failure and hyponatremia. Aspiration fall precaution.  UTI.  Continue IV Rocephin follow-up urine culture  4.  Hyponatremia/acute renal failure Continue normal saline IV and follow-up BMP.  Generalized weakness.  PT evaluation.  All the records are reviewed and case discussed with Care Management/Social Worker. Management plans discussed with the patient's son (POA) and daughter, and they are in agreement.  CODE STATUS: DNR  TOTAL TIME TAKING CARE OF THIS PATIENT: 45 minutes.   More than 50% of the time was spent in  counseling/coordination of care: YES  POSSIBLE D/C IN 2 DAYS, DEPENDING ON CLINICAL CONDITION.   Shaune Pollack M.D on 04/07/2017 at 3:23 PM  Between 7am to 6pm - Pager - [phone]  After 6pm go to [email or web address] - Therapist, nutritional Hospitalists

## 2017-04-07 NOTE — Progress Notes (Signed)
Spoke with Dr. Servando SnareWohl and notified him patient has had two more mucous-like maroon colored stools.  No new orders.

## 2017-04-07 NOTE — Progress Notes (Signed)
Notified Dr Imogene Burnchen of critical lab value lactic acid of 2.3.

## 2017-04-08 LAB — LACTIC ACID, PLASMA: Lactic Acid, Venous: 1.1 mmol/L (ref 0.5–1.9)

## 2017-04-08 LAB — CBC
HEMATOCRIT: 30.8 % — AB (ref 35.0–47.0)
Hemoglobin: 10.4 g/dL — ABNORMAL LOW (ref 12.0–16.0)
MCH: 28.2 pg (ref 26.0–34.0)
MCHC: 33.9 g/dL (ref 32.0–36.0)
MCV: 83.2 fL (ref 80.0–100.0)
PLATELETS: 267 10*3/uL (ref 150–440)
RBC: 3.71 MIL/uL — ABNORMAL LOW (ref 3.80–5.20)
RDW: 13.2 % (ref 11.5–14.5)
WBC: 19.3 10*3/uL — ABNORMAL HIGH (ref 3.6–11.0)

## 2017-04-08 LAB — BASIC METABOLIC PANEL
Anion gap: 9 (ref 5–15)
BUN: 18 mg/dL (ref 6–20)
CALCIUM: 8.7 mg/dL — AB (ref 8.9–10.3)
CHLORIDE: 97 mmol/L — AB (ref 101–111)
CO2: 23 mmol/L (ref 22–32)
CREATININE: 0.77 mg/dL (ref 0.44–1.00)
GFR calc Af Amer: >60 mL/min (ref >60)
GFR calc non Af Amer: >60 mL/min (ref >60)
GLUCOSE: 123 mg/dL — AB (ref 65–99)
Potassium: 4.2 mmol/L (ref 3.5–5.1)
Sodium: 129 mmol/L — ABNORMAL LOW (ref 135–145)

## 2017-04-08 MED ORDER — SODIUM CHLORIDE 0.9 % IV SOLN
INTRAVENOUS | Status: DC
  Administered 2017-04-08 – 2017-04-09 (×3): via INTRAVENOUS

## 2017-04-08 NOTE — Progress Notes (Signed)
[facility] - [place] at [facility]   PATIENT NAME: [name] Cunningham    MR#:  [id number]  DATE OF BIRTH:  Nov 09, 1928  SUBJECTIVE:  CHIEF COMPLAINT:   Chief Complaint  Patient presents with  . Diarrhea   Patient is more alert and awake, no complaints. REVIEW OF SYSTEMS:  Review of Systems  Constitutional: Negative for chills, fever and malaise/fatigue.  HENT: Negative for sore throat.   Eyes: Negative for blurred vision and double vision.  Respiratory: Negative for cough, hemoptysis, shortness of breath, wheezing and stridor.   Cardiovascular: Negative for chest pain, palpitations, orthopnea and leg swelling.  Gastrointestinal: Negative for abdominal pain, blood in stool, diarrhea, melena, nausea and vomiting.  Genitourinary: Negative for dysuria, flank pain and hematuria.  Musculoskeletal: Negative for back pain and joint pain.  Neurological: Negative for dizziness, sensory change, focal weakness, seizures, loss of consciousness, weakness and headaches.  Endo/Heme/Allergies: Negative for polydipsia.  Psychiatric/Behavioral: Negative for depression. The patient is not nervous/anxious.     DRUG ALLERGIES:   Allergies  Allergen Reactions  . Hydroxyzine Hcl Other (See Comments)    Makes patient feel like she does not want to live   VITALS:  Blood pressure (!) 147/61, pulse 72, temperature 97.8 F (36.6 C), temperature source Oral, resp. rate 16, height 5\' 4"  (1.626 m), weight 145 lb (65.8 kg), SpO2 97 %. PHYSICAL EXAMINATION:  Physical Exam  Constitutional: She is oriented to person, place, and time and well-developed, well-nourished, and in no distress.  HENT:  Head: Normocephalic.  Eyes: Conjunctivae and EOM are normal. Pupils are equal, round, and reactive to light. No scleral icterus.  Neck: Normal range of motion. Neck supple. No JVD present. No tracheal deviation present.  Cardiovascular: Normal rate, regular rhythm and normal heart sounds. Exam  reveals no gallop.  No murmur heard. Pulmonary/Chest: Effort normal and breath sounds normal. No respiratory distress. She has no wheezes. She has no rales.  Abdominal: Soft. Bowel sounds are normal. She exhibits no distension. There is no tenderness. There is no rebound.  Musculoskeletal: She exhibits no edema or tenderness.  Neurological: She is alert and oriented to person, place, and time. No cranial nerve deficit.  Skin: No rash noted. No erythema.   LABORATORY PANEL:  Female CBC Recent Labs  Lab 04/08/17 0442  WBC 19.3*  HGB 10.4*  HCT 30.8*  PLT 267   ------------------------------------------------------------------------------------------------------------------ Chemistries  Recent Labs  Lab 04/06/17 1632  04/08/17 0442  NA 124*   < > 129*  K 4.7   < > 4.2  CL 90*   < > 97*  CO2 22   < > 23  GLUCOSE 170*   < > 123*  BUN 25*   < > 18  CREATININE 1.61*   < > 0.77  CALCIUM 9.6   < > 8.7*  AST 32  --   --   ALT 16  --   --   ALKPHOS 88  --   --   BILITOT 1.0  --   --    < > = values in this interval not displayed.   RADIOLOGY:  No results found. ASSESSMENT AND PLAN:   [name] Cunningham  is a 84 y.o. female with a known history of diverticulosis per CT and sigmoidectomy in the past with diverticulitis and fistula tract, COPD, CAD, chronic A. fib on oral anticoagulation with Eliquis comes to the emergency room from [facility] assisted living with diarrhea followed by bright red blood per  rectum.  1.  Lower GI bleed/rectal bleeding suspected diverticular Hold Eliquis and follow-up hemoglobin has been stable. The family states that they would not want her to undergo any invasive procedures per Dr. Servando SnareWohl.    2.  Chronic A. fib heart rate stable -Continue home meds -Hold oral anticoagulation  3.    Acute metabolic encephalopathy, due to UTI, renal failure and hyponatremia. Improved to her baseline. Aspiration fall precaution.  UTI with leukocytosis.  She was  treated IV Rocephin and changed to Keflex p.o., follow-up CBC, urine culture: E. coli.  4.  Hyponatremia/acute renal failure Improving with normal saline IV and follow-up BMP.  Generalized weakness.  PT evaluation: back to previous facility.  All the records are reviewed and case discussed with Care Management/Social Worker. Management plans discussed with the patient's son (POA) and daughter, and they are in agreement.  CODE STATUS: DNR  TOTAL TIME TAKING CARE OF THIS PATIENT: 33 minutes.   More than 50% of the time was spent in counseling/coordination of care: YES  POSSIBLE D/C IN 1-2 DAYS, DEPENDING ON CLINICAL CONDITION.   Shaune PollackQing Sheera Illingworth M.D on 04/08/2017 at 2:46 PM  Between 7am to 6pm - Pager - [phone]  After 6pm go to [email or web address] - Therapist, nutritionalpassword [facility]AS [facility]  [facility] [place] [facility]

## 2017-04-08 NOTE — Progress Notes (Signed)
Physical Therapy Treatment Patient Details Name: [name] Cunningham T Cunningham MRN: [id number] DOB: 04/09/1928 Today's Date: 04/08/2017    History of Present Illness Pt is an 84 y.o. female presenting to hospital with fatigue, diarrhea, and BRBPR.  Pt admitted with lower GI bleed/rectal bleeding (suspected diverticular).  PMH includes dementia, anxiety, a-fib, CAD, CHF, COPD, htn, hiatal hernia, h/o PE, polymyalgia rheumatica.    PT Comments    Pt up to chair with nursing this am.  Did well and daughter reports she is back at her baseline.  Pt in chair and agrees to seated exercises as below.  Practiced transfers to/from bed with hand held assist and min hand held assist and encouragement.  Pt has progressed well since last session and per daughter, pt is back to baseline with mobility skills.  Pt transfers to wheelchair/commode and uses LE's to propel chair.  WC mobility not tested as our wheelchairs all have leg rests and concerns over LE injury/skin tears if she tried to propel using LE's here but pt has good ROM and strength so issues are not anticipated.   Follow Up Recommendations  Supervision/Assistance - 24 hour - back to previous facility     Equipment Recommendations       Recommendations for Other Services       Precautions / Restrictions Precautions Precautions: Fall Restrictions Weight Bearing Restrictions: No    Mobility  Bed Mobility               General bed mobility comments: in recliner - daughter reports her being back to baseline when she got up with nursing this am  Transfers Overall transfer level: Needs assistance Equipment used: None Transfers: Sit to/from [employer] CorporationStand;Stand Pivot Transfers Sit to Stand: Min guard Stand pivot transfers: Min guard          Ambulation/Gait Ambulation/Gait assistance: Min guard Ambulation Distance (Feet): 2 Feet Assistive device: 1 person hand held assist     Gait velocity interpretation: Below normal speed for  age/gender General Gait Details: few steps to turn to/from Chief Operating Officerrecliner   Stairs            Wheelchair Mobility    Modified Rankin (Stroke Patients Only)       Balance Overall balance assessment: Needs assistance Sitting-balance support: Bilateral upper extremity supported;Feet supported Sitting balance-Leahy Scale: Fair     Standing balance support: Bilateral upper extremity supported Standing balance-Leahy Scale: Poor Standing balance comment: No lob or buckling noted with transfer.  Slow cautious steps.                            Cognition Arousal/Alertness: Awake/alert Behavior During Therapy: WFL for tasks assessed/performed Overall Cognitive Status: Within Functional Limits for tasks assessed                                        Exercises Other Exercises Other Exercises: BLE AROM ankle pumps, LAQ, marches and pillow squeeze 2 x 10    General Comments        Pertinent Vitals/Pain Pain Assessment: 0-10 Pain Score: 4  Pain Location: B feet.  Daughter attributes it to her neuropathy. Pain Descriptors / Indicators: Sore Pain Intervention(s): Limited activity within patient's tolerance    Home Living                      Prior  Function            PT Goals (current goals can now be found in the care plan section) Progress towards PT goals: Progressing toward goals    Frequency    Min 2X/week      PT Plan Current plan remains appropriate    Co-evaluation              AM-PAC PT "6 Clicks" Daily Activity  Outcome Measure  Difficulty turning over in bed (including adjusting bedclothes, sheets and blankets)?: Unable Difficulty moving from lying on back to sitting on the side of the bed? : Unable Difficulty sitting down on and standing up from a chair with arms (e.g., wheelchair, bedside commode, etc,.)?: Unable Help needed moving to and from a bed to chair (including a wheelchair)?: A Little Help needed  walking in hospital room?: Total Help needed climbing 3-5 steps with a railing? : Total 6 Click Score: 8    End of Session Equipment Utilized During Treatment: Gait belt Activity Tolerance: Patient tolerated treatment well Patient left: in chair;with chair alarm set;with call bell/phone within reach;with family/visitor present Nurse Communication: Mobility status       Time: 1610-9604 PT Time Calculation (min) (ACUTE ONLY): 9 min  Charges:  $Therapeutic Exercise: 8-22 mins                    G Codes:       Danielle Dess, PTA 04/08/17, 9:49 AM

## 2017-04-08 NOTE — Progress Notes (Signed)
Patient assisted to the chair with mod assist from CNA and myself.  Daughter is at the bedside

## 2017-04-08 NOTE — Progress Notes (Signed)
Reviewed chart and pt had Lactic acid of 2.3 , BUN 26, Creat 1.14 from 1/2 labs.  Per Dr Nicky Pughhen's note in the afternoon, pt is to continue with IVF.  No IVF ordered at this time.  Spoke with [facility] doc and received order for .9NS at 75 cc/hr. Henriette CombsSArah Jemmie Rhinehart RN

## 2017-04-08 NOTE — Care Management Important Message (Signed)
Important Message  Patient Details  Name: Almon RegisterMargaret T Colonna MRN: [id number] Date of Birth: 03/04/1929   Medicare Important Message Given:  Yes    Chapman FitchBOWEN, Shawnique Mariotti T, RN 04/08/2017, 2:13 PM

## 2017-04-09 LAB — CBC
HCT: 32.6 % — ABNORMAL LOW (ref 35.0–47.0)
HEMOGLOBIN: 11 g/dL — AB (ref 12.0–16.0)
MCH: 28.1 pg (ref 26.0–34.0)
MCHC: 33.8 g/dL (ref 32.0–36.0)
MCV: 83 fL (ref 80.0–100.0)
Platelets: 299 10*3/uL (ref 150–440)
RBC: 3.93 MIL/uL (ref 3.80–5.20)
RDW: 13.4 % (ref 11.5–14.5)
WBC: 13.7 10*3/uL — ABNORMAL HIGH (ref 3.6–11.0)

## 2017-04-09 LAB — BASIC METABOLIC PANEL
Anion gap: 9 (ref 5–15)
BUN: 11 mg/dL (ref 6–20)
CHLORIDE: 97 mmol/L — AB (ref 101–111)
CO2: 26 mmol/L (ref 22–32)
CREATININE: 0.61 mg/dL (ref 0.44–1.00)
Calcium: 8.8 mg/dL — ABNORMAL LOW (ref 8.9–10.3)
GFR calc Af Amer: >60 mL/min (ref >60)
GFR calc non Af Amer: >60 mL/min (ref >60)
GLUCOSE: 100 mg/dL — AB (ref 65–99)
POTASSIUM: 3.1 mmol/L — AB (ref 3.5–5.1)
SODIUM: 132 mmol/L — AB (ref 135–145)

## 2017-04-09 LAB — URINE CULTURE
Culture: >=100000 — AB
Special Requests: NORMAL

## 2017-04-09 LAB — MAGNESIUM: Magnesium: 1.4 mg/dL — ABNORMAL LOW (ref 1.7–2.4)

## 2017-04-09 MED ORDER — GABAPENTIN 100 MG PO CAPS
100.0000 mg | ORAL_CAPSULE | Freq: Two times a day (BID) | ORAL | Status: DC
  Administered 2017-04-09: 100 mg via ORAL
  Filled 2017-04-09: qty 1

## 2017-04-09 MED ORDER — COLCHICINE 0.6 MG PO TABS
0.3000 mg | ORAL_TABLET | Freq: Every day | ORAL | Status: DC
  Administered 2017-04-09: 0.3 mg via ORAL
  Filled 2017-04-09: qty 1
  Filled 2017-04-09: qty 0.5

## 2017-04-09 MED ORDER — ALBUTEROL SULFATE (2.5 MG/3ML) 0.083% IN NEBU: 2.5000 mg | INHALATION_SOLUTION | Freq: Four times a day (QID) | RESPIRATORY_TRACT | Status: DC | PRN

## 2017-04-09 MED ORDER — CEPHALEXIN 250 MG/5ML PO SUSR: 500.0000 mg | Freq: Two times a day (BID) | ORAL | 0 refills | Status: DC

## 2017-04-09 MED ORDER — MAGNESIUM OXIDE 400 (241.3 MG) MG PO TABS
400.0000 mg | ORAL_TABLET | Freq: Every day | ORAL | Status: DC
  Administered 2017-04-09: 400 mg via ORAL
  Filled 2017-04-09: qty 1

## 2017-04-09 MED ORDER — LISINOPRIL 20 MG PO TABS
20.0000 mg | ORAL_TABLET | Freq: Every day | ORAL | Status: DC
  Administered 2017-04-09: 20 mg via ORAL
  Filled 2017-04-09: qty 1

## 2017-04-09 MED ORDER — POTASSIUM CHLORIDE CRYS ER 20 MEQ PO TBCR
40.0000 meq | EXTENDED_RELEASE_TABLET | Freq: Once | ORAL | Status: AC
  Administered 2017-04-09: 40 meq via ORAL
  Filled 2017-04-09: qty 2

## 2017-04-09 MED ORDER — DILTIAZEM HCL ER COATED BEADS 240 MG PO CP24
240.0000 mg | ORAL_CAPSULE | Freq: Every day | ORAL | Status: DC
  Administered 2017-04-09: 240 mg via ORAL
  Filled 2017-04-09: qty 1
  Filled 2017-04-09: qty 2

## 2017-04-09 MED ORDER — PANTOPRAZOLE SODIUM 40 MG PO TBEC: 40.0000 mg | DELAYED_RELEASE_TABLET | Freq: Every day | ORAL | Status: DC

## 2017-04-09 NOTE — Progress Notes (Signed)
Call to [facility] Doctor on call regarding abnormal lab values.  No new directives, no changes in current plan of care given at this time.

## 2017-04-09 NOTE — Discharge Instructions (Signed)
-   Fall precaution 

## 2017-04-09 NOTE — Discharge Summary (Signed)
[facility] [place] [facility]   PATIENT NAME: [name] Cunningham    MR#:  [id number]  DATE OF BIRTH:  [birth year]  DATE OF ADMISSION:  04/06/2017   ADMITTING PHYSICIAN: Enedina Finner, MD  DATE OF DISCHARGE: 04/09/2017 PRIMARY CARE PHYSICIAN: Karie Schwalbe, MD   ADMISSION DIAGNOSIS:  Hematochezia [K92.1] DISCHARGE DIAGNOSIS:  Active Problems:   Rectal bleeding   Hematochezia  SECONDARY DIAGNOSIS:   Past Medical History:  Diagnosis Date  . ANXIETY 11/21/2008  . Atrial fibrillation (HCC)   . CAD (coronary artery disease)    Mild by cath in remote past  . CHF (congestive heart failure) (HCC)   . COPD 01/06/2007  . DIVERTICULITIS OF COLON 07/23/2009  . Fistula   . GERD 01/06/2007  . HYPERLIPIDEMIA 01/06/2007  . HYPERTENSION 01/06/2007  . PAD (peripheral artery disease) (HCC)   . SLEEP APNEA 11/21/2008   HOSPITAL COURSE:   MargaretWelbornis a88 y.o.femalewith a known history of diverticulosis per CT and sigmoidectomy in the past with diverticulitis and fistula tract, COPD, CAD, chronic A. fib on oral anticoagulation with Eliquis comes to the emergency room from [facility] assisted living with diarrhea followed by bright red blood per rectum.  1. Lower GI bleed/rectal bleeding suspected diverticular Hold Eliquis and follow-up hemoglobin has been stable. The family states that they would not want her to undergo any invasive procedures per Dr. Servando Snare.   2. Chronic A. fib heart rate stable -Continue home meds -Stopped oral anticoagulation  3.   Acute metabolic encephalopathy, due to UTI, renal failure and hyponatremia. Improved to her baseline. Aspiration fall precaution.  UTI with leukocytosis.  She was treated IV Rocephin and changed to Keflex p.o., urine culture: E. coli.  4. Hyponatremia/acute renal failure Improving with normal saline IV and follow-up BMP as outpatient, hold lasix.  5. HTN. Continue HTN meds except lasix.   6.  Hypokalemia. Given KCl.  Generalized weakness.  PT evaluation: back to previous facility. DISCHARGE CONDITIONS:  Stable, discharge to SNF today. CONSULTS OBTAINED:   DRUG ALLERGIES:   Allergies  Allergen Reactions  . Hydroxyzine Hcl Other (See Comments)    Makes patient feel like she does not want to live   DISCHARGE MEDICATIONS:   Allergies as of 04/09/2017      Reactions   Hydroxyzine Hcl Other (See Comments)   Makes patient feel like she does not want to live      Medication List    STOP taking these medications   ELIQUIS 5 MG Tabs tablet Generic drug:  apixaban   furosemide 40 MG tablet Commonly known as:  LASIX   traMADol 50 MG tablet Commonly known as:  ULTRAM     TAKE these medications   acetaminophen 500 MG tablet Commonly known as:  TYLENOL Take 1,000 mg by mouth 3 (three) times daily as needed.   acetaminophen 325 MG tablet Commonly known as:  TYLENOL Take 2 tablets (650 mg total) by mouth every 6 (six) hours as needed for mild pain (headache).   albuterol (2.5 MG/3ML) 0.083% nebulizer solution Commonly known as:  PROVENTIL Take 2.5 mg by nebulization every 6 (six) hours as needed for wheezing or shortness of breath.   albuterol 1.25 MG/3ML nebulizer solution Commonly known as:  ACCUNEB Take 3 mLs by nebulization once as needed for wheezing.   carboxymethylcellulose 0.5 % Soln Commonly known as:  REFRESH PLUS Place 1 drop into both eyes as needed.   cephALEXin 250 MG/5ML suspension Commonly known as:  KEFLEX Take 10 mLs (500 mg total) by mouth every 12 (twelve) hours. For 3 days.   colchicine 0.6 MG tablet Take 0.3 mg by mouth daily.   DIAPER RASH PRODUCTS EX Apply 1 application topically 3 (three) times daily as needed.   diltiazem 240 MG 24 hr capsule Commonly known as:  CARDIZEM CD Take 240 mg by mouth daily.   gabapentin 100 MG capsule Commonly known as:  NEURONTIN Take 100 mg by mouth 2 (two) times daily.   lisinopril 20 MG  tablet Commonly known as:  PRINIVIL,ZESTRIL Take 20 mg by mouth daily.   loperamide 2 MG capsule Commonly known as:  IMODIUM Take 2-4 mg by mouth. Take 2 capsules (4mg ) by mouth at first sign of loose stool then take 1 capsule (2mg ) after each additional loose stool (max 2 doses daily)   magnesium hydroxide 800 MG/5ML suspension Commonly known as:  MILK OF MAGNESIA Take 30 mLs by mouth 2 (two) times daily as needed for constipation.   magnesium oxide 400 MG tablet Commonly known as:  MAG-OX Take 400 mg by mouth daily.   Melatonin 3 MG Tabs Take 3 mg by mouth at bedtime.   nystatin powder Commonly known as:  MYCOSTATIN/NYSTOP Apply 1 application topically 2 (two) times daily.   omeprazole 40 MG capsule Commonly known as:  PRILOSEC Take 40 mg by mouth daily with breakfast.   ondansetron 4 MG tablet Commonly known as:  ZOFRAN Take 1 tablet (4 mg total) by mouth every 6 (six) hours. As needed for nausea or vomiting What changed:    when to take this  reasons to take this  additional instructions   vitamin B-12 100 MCG tablet Commonly known as:  CYANOCOBALAMIN Take 1 tablet (100 mcg total) by mouth daily.   Vitamin D (Ergocalciferol) 50000 units Caps capsule Commonly known as:  DRISDOL Take 50,000 Units by mouth every 30 (thirty) days.        DISCHARGE INSTRUCTIONS:  See AVS.  If you experience worsening of your admission symptoms, develop shortness of breath, life threatening emergency, suicidal or homicidal thoughts you must seek medical attention immediately by calling 911 or calling your MD immediately  if symptoms less severe.  You Must read complete instructions/literature along with all the possible adverse reactions/side effects for all the Medicines you take and that have been prescribed to you. Take any new Medicines after you have completely understood and accpet all the possible adverse reactions/side effects.   Please note  You were cared for by a  hospitalist during your hospital stay. If you have any questions about your discharge medications or the care you received while you were in the hospital after you are discharged, you can call the unit and asked to speak with the hospitalist on call if the hospitalist that took care of you is not available. Once you are discharged, your primary care physician will handle any further medical issues. Please note that NO REFILLS for any discharge medications will be authorized once you are discharged, as it is imperative that you return to your primary care physician (or establish a relationship with a primary care physician if you do not have one) for your aftercare needs so that they can reassess your need for medications and monitor your lab values.    On the day of Discharge:  VITAL SIGNS:  Blood pressure (!) 158/70, pulse 88, temperature 98.2 F (36.8 C), temperature source Oral, resp. rate 20, height 5\' 4"  (1.626 m), weight 145  lb (65.8 kg), SpO2 98 %. PHYSICAL EXAMINATION:  GENERAL:  84 y.o.-year-old patient lying in the bed with no acute distress.  EYES: Pupils equal, round, reactive to light and accommodation. No scleral icterus. Extraocular muscles intact.  HEENT: Head atraumatic, normocephalic. Oropharynx and nasopharynx clear.  NECK:  Supple, no jugular venous distention. No thyroid enlargement, no tenderness.  LUNGS: Normal breath sounds bilaterally, no wheezing, rales,rhonchi or crepitation. No use of accessory muscles of respiration.  CARDIOVASCULAR: S1, S2 normal. No murmurs, rubs, or gallops.  ABDOMEN: Soft, non-tender, non-distended. Bowel sounds present. No organomegaly or mass.  EXTREMITIES: No pedal edema, cyanosis, or clubbing.  NEUROLOGIC: Cranial nerves II through XII are intact. Muscle strength 4/5 in all extremities. Sensation intact. Gait not checked.  PSYCHIATRIC: The patient is alert and oriented x 3.  SKIN: No obvious rash, lesion, or ulcer.  DATA REVIEW:    CBC Recent Labs  Lab 04/09/17 0449  WBC 13.7*  HGB 11.0*  HCT 32.6*  PLT 299    Chemistries  Recent Labs  Lab 04/06/17 1632  04/09/17 0449  NA 124*   < > 132*  K 4.7   < > 3.1*  CL 90*   < > 97*  CO2 22   < > 26  GLUCOSE 170*   < > 100*  BUN 25*   < > 11  CREATININE 1.61*   < > 0.61  CALCIUM 9.6   < > 8.8*  AST 32  --   --   ALT 16  --   --   ALKPHOS 88  --   --   BILITOT 1.0  --   --    < > = values in this interval not displayed.     Microbiology Results  Results for orders placed or performed during the hospital encounter of 04/06/17  Urine culture     Status: Abnormal (Preliminary result)   Collection Time: 04/06/17  4:33 PM  Result Value Ref Range Status   Specimen Description   Final    URINE, RANDOM Performed at [facility], [address] [place], [place] [address]    Special Requests   Final    Normal Performed at [facility], [address] [place], [place] [address]    Culture >=100,000 COLONIES/mL ESCHERICHIA COLI (A)  Final   Report Status PENDING  Incomplete  MRSA PCR Screening     Status: None   Collection Time: 04/07/17  7:35 AM  Result Value Ref Range Status   MRSA by PCR NEGATIVE NEGATIVE Final    Comment:        The GeneXpert MRSA Assay (FDA approved for NASAL specimens only), is one component of a comprehensive MRSA colonization surveillance program. It is not intended to diagnose MRSA infection nor to guide or monitor treatment for MRSA infections. Performed at [facility], [address] [place], [place] [address]     RADIOLOGY:  No results found.   Management plans discussed with the patient, family and they are in agreement.  CODE STATUS: DNR   TOTAL TIME TAKING CARE OF THIS PATIENT: 33 minutes.    Shaune Pollack M.D on 04/09/2017 at 8:20 AM  Between 7am to 6pm - Pager - [phone]  After 6pm go to [email or web address] - Social research officer, government  [facility] [place] [facility]   Office  [phone]  CC: Primary care physician; Karie Schwalbe, MD   Note: This dictation was prepared with Dragon dictation along with smaller phrase technology.  Any transcriptional errors that result from this process are unintentional.

## 2017-04-12 DIAGNOSIS — F0150 Vascular dementia without behavioral disturbance: Secondary | ICD-10-CM

## 2017-04-12 DIAGNOSIS — I482 Chronic atrial fibrillation: Secondary | ICD-10-CM | POA: Diagnosis not present

## 2017-04-12 DIAGNOSIS — K922 Gastrointestinal hemorrhage, unspecified: Secondary | ICD-10-CM | POA: Diagnosis not present

## 2017-04-17 LAB — BLOOD GAS, VENOUS
PATIENT TEMPERATURE: 37
PCO2 VEN: 58 mmHg (ref 44.0–60.0)
PH VEN: 7.26 (ref 7.250–7.430)

## 2017-04-22 DIAGNOSIS — R05 Cough: Secondary | ICD-10-CM | POA: Diagnosis not present

## 2017-05-17 DIAGNOSIS — L309 Dermatitis, unspecified: Secondary | ICD-10-CM

## 2017-05-28 DIAGNOSIS — I10 Essential (primary) hypertension: Secondary | ICD-10-CM | POA: Diagnosis not present

## 2017-05-28 DIAGNOSIS — J449 Chronic obstructive pulmonary disease, unspecified: Secondary | ICD-10-CM | POA: Diagnosis not present

## 2017-05-28 DIAGNOSIS — I4891 Unspecified atrial fibrillation: Secondary | ICD-10-CM | POA: Diagnosis not present

## 2017-05-28 DIAGNOSIS — F39 Unspecified mood [affective] disorder: Secondary | ICD-10-CM | POA: Diagnosis not present

## 2017-05-28 DIAGNOSIS — M1000 Idiopathic gout, unspecified site: Secondary | ICD-10-CM | POA: Diagnosis not present

## 2017-05-28 DIAGNOSIS — I5030 Unspecified diastolic (congestive) heart failure: Secondary | ICD-10-CM | POA: Diagnosis not present

## 2017-05-28 DIAGNOSIS — F0150 Vascular dementia without behavioral disturbance: Secondary | ICD-10-CM | POA: Diagnosis not present

## 2017-07-15 DIAGNOSIS — I5032 Chronic diastolic (congestive) heart failure: Secondary | ICD-10-CM

## 2017-07-15 DIAGNOSIS — M159 Polyosteoarthritis, unspecified: Secondary | ICD-10-CM

## 2017-07-15 DIAGNOSIS — I480 Paroxysmal atrial fibrillation: Secondary | ICD-10-CM

## 2017-07-15 DIAGNOSIS — F0150 Vascular dementia without behavioral disturbance: Secondary | ICD-10-CM | POA: Diagnosis not present

## 2017-07-15 DIAGNOSIS — F39 Unspecified mood [affective] disorder: Secondary | ICD-10-CM | POA: Diagnosis not present

## 2017-07-15 DIAGNOSIS — J439 Emphysema, unspecified: Secondary | ICD-10-CM

## 2017-09-17 DIAGNOSIS — F0150 Vascular dementia without behavioral disturbance: Secondary | ICD-10-CM | POA: Diagnosis not present

## 2017-09-17 DIAGNOSIS — F39 Unspecified mood [affective] disorder: Secondary | ICD-10-CM | POA: Diagnosis not present

## 2017-09-17 DIAGNOSIS — I5030 Unspecified diastolic (congestive) heart failure: Secondary | ICD-10-CM | POA: Diagnosis not present

## 2017-09-17 DIAGNOSIS — I10 Essential (primary) hypertension: Secondary | ICD-10-CM | POA: Diagnosis not present

## 2017-09-17 DIAGNOSIS — I4891 Unspecified atrial fibrillation: Secondary | ICD-10-CM | POA: Diagnosis not present

## 2017-09-17 DIAGNOSIS — M1000 Idiopathic gout, unspecified site: Secondary | ICD-10-CM | POA: Diagnosis not present

## 2017-09-17 DIAGNOSIS — K219 Gastro-esophageal reflux disease without esophagitis: Secondary | ICD-10-CM | POA: Diagnosis not present

## 2017-09-17 DIAGNOSIS — M1990 Unspecified osteoarthritis, unspecified site: Secondary | ICD-10-CM

## 2017-09-21 ENCOUNTER — Other Ambulatory Visit: Payer: Self-pay

## 2017-09-21 ENCOUNTER — Inpatient Hospital Stay
Admission: EM | Admit: 2017-09-21 | Discharge: 2017-09-24 | DRG: 253 | Disposition: A | Discharge status: Skilled Nursing Facility | Payer: Medicare Other | Attending: Internal Medicine | Admitting: Internal Medicine

## 2017-09-21 DIAGNOSIS — Z7951 Long term (current) use of inhaled steroids: Secondary | ICD-10-CM | POA: Diagnosis not present

## 2017-09-21 DIAGNOSIS — F0280 Dementia in other diseases classified elsewhere without behavioral disturbance: Secondary | ICD-10-CM | POA: Diagnosis present

## 2017-09-21 DIAGNOSIS — I5032 Chronic diastolic (congestive) heart failure: Secondary | ICD-10-CM | POA: Diagnosis present

## 2017-09-21 DIAGNOSIS — G309 Alzheimer's disease, unspecified: Secondary | ICD-10-CM | POA: Diagnosis present

## 2017-09-21 DIAGNOSIS — J449 Chronic obstructive pulmonary disease, unspecified: Secondary | ICD-10-CM | POA: Diagnosis present

## 2017-09-21 DIAGNOSIS — I4891 Unspecified atrial fibrillation: Secondary | ICD-10-CM | POA: Diagnosis not present

## 2017-09-21 DIAGNOSIS — Z7901 Long term (current) use of anticoagulants: Secondary | ICD-10-CM | POA: Diagnosis not present

## 2017-09-21 DIAGNOSIS — G4730 Sleep apnea, unspecified: Secondary | ICD-10-CM | POA: Diagnosis present

## 2017-09-21 DIAGNOSIS — E1151 Type 2 diabetes mellitus with diabetic peripheral angiopathy without gangrene: Secondary | ICD-10-CM | POA: Diagnosis present

## 2017-09-21 DIAGNOSIS — F0390 Unspecified dementia without behavioral disturbance: Secondary | ICD-10-CM | POA: Diagnosis not present

## 2017-09-21 DIAGNOSIS — I7090 Unspecified atherosclerosis: Secondary | ICD-10-CM | POA: Diagnosis present

## 2017-09-21 DIAGNOSIS — E871 Hypo-osmolality and hyponatremia: Secondary | ICD-10-CM | POA: Diagnosis present

## 2017-09-21 DIAGNOSIS — Z79899 Other long term (current) drug therapy: Secondary | ICD-10-CM

## 2017-09-21 DIAGNOSIS — E785 Hyperlipidemia, unspecified: Secondary | ICD-10-CM | POA: Diagnosis present

## 2017-09-21 DIAGNOSIS — Z66 Do not resuscitate: Secondary | ICD-10-CM | POA: Diagnosis present

## 2017-09-21 DIAGNOSIS — I110 Hypertensive heart disease with heart failure: Secondary | ICD-10-CM | POA: Diagnosis present

## 2017-09-21 DIAGNOSIS — E1140 Type 2 diabetes mellitus with diabetic neuropathy, unspecified: Secondary | ICD-10-CM | POA: Diagnosis present

## 2017-09-21 DIAGNOSIS — E876 Hypokalemia: Secondary | ICD-10-CM | POA: Diagnosis not present

## 2017-09-21 DIAGNOSIS — L03115 Cellulitis of right lower limb: Secondary | ICD-10-CM | POA: Diagnosis not present

## 2017-09-21 DIAGNOSIS — K219 Gastro-esophageal reflux disease without esophagitis: Secondary | ICD-10-CM | POA: Diagnosis present

## 2017-09-21 DIAGNOSIS — I2510 Atherosclerotic heart disease of native coronary artery without angina pectoris: Secondary | ICD-10-CM | POA: Diagnosis present

## 2017-09-21 DIAGNOSIS — I70201 Unspecified atherosclerosis of native arteries of extremities, right leg: Secondary | ICD-10-CM | POA: Diagnosis present

## 2017-09-21 DIAGNOSIS — I482 Chronic atrial fibrillation: Secondary | ICD-10-CM | POA: Diagnosis present

## 2017-09-21 DIAGNOSIS — Z87891 Personal history of nicotine dependence: Secondary | ICD-10-CM | POA: Diagnosis not present

## 2017-09-21 DIAGNOSIS — I998 Other disorder of circulatory system: Secondary | ICD-10-CM

## 2017-09-21 DIAGNOSIS — I70221 Atherosclerosis of native arteries of extremities with rest pain, right leg: Secondary | ICD-10-CM | POA: Diagnosis not present

## 2017-09-21 DIAGNOSIS — M79604 Pain in right leg: Secondary | ICD-10-CM | POA: Diagnosis not present

## 2017-09-21 LAB — CBC WITH DIFFERENTIAL/PLATELET
Basophils Absolute: 0.1 10*3/uL (ref 0–0.1)
Basophils Relative: 1 %
Eosinophils Absolute: 0.5 10*3/uL (ref 0–0.7)
Eosinophils Relative: 5 %
HEMATOCRIT: 33.7 % — AB (ref 35.0–47.0)
Hemoglobin: 11.5 g/dL — ABNORMAL LOW (ref 12.0–16.0)
LYMPHS PCT: 17 %
Lymphs Abs: 1.7 10*3/uL (ref 1.0–3.6)
MCH: 27.8 pg (ref 26.0–34.0)
MCHC: 34.2 g/dL (ref 32.0–36.0)
MCV: 81.2 fL (ref 80.0–100.0)
MONO ABS: 1.3 10*3/uL — AB (ref 0.2–0.9)
MONOS PCT: 13 %
NEUTROS ABS: 6.3 10*3/uL (ref 1.4–6.5)
Neutrophils Relative %: 64 %
Platelets: 298 10*3/uL (ref 150–440)
RBC: 4.14 MIL/uL (ref 3.80–5.20)
RDW: 13.7 % (ref 11.5–14.5)
WBC: 9.8 10*3/uL (ref 3.6–11.0)

## 2017-09-21 LAB — COMPREHENSIVE METABOLIC PANEL
ALT: 41 U/L (ref 14–54)
AST: 40 U/L (ref 15–41)
Albumin: 3.4 g/dL — ABNORMAL LOW (ref 3.5–5.0)
Alkaline Phosphatase: 73 U/L (ref 38–126)
Anion gap: 13 (ref 5–15)
BILIRUBIN TOTAL: 0.4 mg/dL (ref 0.3–1.2)
BUN: 31 mg/dL — ABNORMAL HIGH (ref 6–20)
CHLORIDE: 92 mmol/L — AB (ref 101–111)
CO2: 25 mmol/L (ref 22–32)
Calcium: 9.2 mg/dL (ref 8.9–10.3)
Creatinine, Ser: 1.03 mg/dL — ABNORMAL HIGH (ref 0.44–1.00)
GFR calc Af Amer: 55 mL/min — ABNORMAL LOW (ref >60)
GFR calc non Af Amer: 48 mL/min — ABNORMAL LOW (ref >60)
GLUCOSE: 132 mg/dL — AB (ref 65–99)
POTASSIUM: 3.9 mmol/L (ref 3.5–5.1)
Sodium: 130 mmol/L — ABNORMAL LOW (ref 135–145)
TOTAL PROTEIN: 7.6 g/dL (ref 6.5–8.1)

## 2017-09-21 LAB — MRSA PCR SCREENING: MRSA by PCR: NEGATIVE

## 2017-09-21 LAB — PROTIME-INR
INR: 1.18
Prothrombin Time: 14.9 seconds (ref 11.4–15.2)

## 2017-09-21 LAB — APTT: aPTT: 39 seconds — ABNORMAL HIGH (ref 24–36)

## 2017-09-21 LAB — LACTIC ACID, PLASMA: Lactic Acid, Venous: 1.2 mmol/L (ref 0.5–1.9)

## 2017-09-21 MED ORDER — PANTOPRAZOLE SODIUM 40 MG PO TBEC
40.0000 mg | DELAYED_RELEASE_TABLET | Freq: Every day | ORAL | Status: DC
  Administered 2017-09-22 – 2017-09-24 (×2): 40 mg via ORAL
  Filled 2017-09-21 (×2): qty 1

## 2017-09-21 MED ORDER — VITAMIN D (ERGOCALCIFEROL) 1.25 MG (50000 UNIT) PO CAPS
50000.0000 [IU] | ORAL_CAPSULE | ORAL | Status: DC
Start: 2017-09-22 — End: 2017-09-24
  Administered 2017-09-22: 50000 [IU] via ORAL
  Filled 2017-09-21: qty 1

## 2017-09-21 MED ORDER — SENNOSIDES-DOCUSATE SODIUM 8.6-50 MG PO TABS
2.0000 | ORAL_TABLET | Freq: Every day | ORAL | Status: DC
Start: 2017-09-22 — End: 2017-09-24
  Administered 2017-09-22 – 2017-09-24 (×2): 2 via ORAL
  Filled 2017-09-21 (×2): qty 2

## 2017-09-21 MED ORDER — FENTANYL CITRATE (PF) 100 MCG/2ML IJ SOLN
50.0000 ug | Freq: Once | INTRAMUSCULAR | Status: AC
  Administered 2017-09-21: 50 ug via INTRAVENOUS
  Filled 2017-09-21: qty 2

## 2017-09-21 MED ORDER — FUROSEMIDE 20 MG PO TABS: 20.0000 mg | ORAL_TABLET | Freq: Every day | ORAL | Status: DC | PRN

## 2017-09-21 MED ORDER — DILTIAZEM HCL ER COATED BEADS 120 MG PO CP24
240.0000 mg | ORAL_CAPSULE | Freq: Every day | ORAL | Status: DC
  Administered 2017-09-22 – 2017-09-24 (×2): 240 mg via ORAL
  Filled 2017-09-21 (×3): qty 2

## 2017-09-21 MED ORDER — NYSTATIN 100000 UNIT/GM EX POWD
Freq: Two times a day (BID) | CUTANEOUS | Status: DC
  Administered 2017-09-22 – 2017-09-24 (×4): via TOPICAL
  Filled 2017-09-21: qty 15

## 2017-09-21 MED ORDER — TRAMADOL HCL 50 MG PO TABS
50.0000 mg | ORAL_TABLET | Freq: Four times a day (QID) | ORAL | Status: DC | PRN
  Administered 2017-09-21 – 2017-09-24 (×4): 50 mg via ORAL
  Filled 2017-09-21 (×4): qty 1

## 2017-09-21 MED ORDER — HEPARIN BOLUS VIA INFUSION
3800.0000 [IU] | Freq: Once | INTRAVENOUS | Status: DC
  Filled 2017-09-21: qty 3800

## 2017-09-21 MED ORDER — MELATONIN 5 MG PO TABS
5.0000 mg | ORAL_TABLET | Freq: Every day | ORAL | Status: DC
  Administered 2017-09-22 – 2017-09-23 (×2): 5 mg via ORAL
  Filled 2017-09-21 (×4): qty 1

## 2017-09-21 MED ORDER — ONDANSETRON HCL 4 MG PO TABS: 4.0000 mg | ORAL_TABLET | Freq: Three times a day (TID) | ORAL | Status: DC | PRN

## 2017-09-21 MED ORDER — HEPARIN BOLUS VIA INFUSION
2000.0000 [IU] | Freq: Once | INTRAVENOUS | Status: AC
  Administered 2017-09-21: 2000 [IU] via INTRAVENOUS
  Filled 2017-09-21: qty 2000

## 2017-09-21 MED ORDER — ACETAMINOPHEN 325 MG PO TABS
650.0000 mg | ORAL_TABLET | Freq: Four times a day (QID) | ORAL | Status: DC | PRN
  Administered 2017-09-22 – 2017-09-24 (×3): 650 mg via ORAL
  Filled 2017-09-21 (×3): qty 2

## 2017-09-21 MED ORDER — POLYVINYL ALCOHOL 1.4 % OP SOLN
1.0000 [drp] | Freq: Every day | OPHTHALMIC | Status: DC | PRN
  Filled 2017-09-21: qty 15

## 2017-09-21 MED ORDER — COLCHICINE 0.6 MG PO TABS
0.3000 mg | ORAL_TABLET | Freq: Every day | ORAL | Status: DC
  Administered 2017-09-24: 0.3 mg via ORAL
  Filled 2017-09-21: qty 1
  Filled 2017-09-21: qty 0.5
  Filled 2017-09-21: qty 1
  Filled 2017-09-21 (×2): qty 0.5

## 2017-09-21 MED ORDER — LOPERAMIDE HCL 2 MG PO CAPS: 2.0000 mg | ORAL_CAPSULE | ORAL | Status: DC | PRN

## 2017-09-21 MED ORDER — HEPARIN (PORCINE) IN NACL 100-0.45 UNIT/ML-% IJ SOLN
16.0000 [IU]/kg/h | INTRAMUSCULAR | Status: DC
  Filled 2017-09-21: qty 250

## 2017-09-21 MED ORDER — VITAMIN B-12 100 MCG PO TABS
100.0000 ug | ORAL_TABLET | Freq: Every day | ORAL | Status: DC
  Administered 2017-09-22 – 2017-09-24 (×2): 100 ug via ORAL
  Filled 2017-09-21 (×3): qty 1

## 2017-09-21 MED ORDER — ALBUTEROL SULFATE (2.5 MG/3ML) 0.083% IN NEBU: 2.5000 mg | INHALATION_SOLUTION | Freq: Four times a day (QID) | RESPIRATORY_TRACT | Status: DC | PRN

## 2017-09-21 MED ORDER — GABAPENTIN 100 MG PO CAPS
100.0000 mg | ORAL_CAPSULE | Freq: Two times a day (BID) | ORAL | Status: DC
  Administered 2017-09-21 – 2017-09-24 (×5): 100 mg via ORAL
  Filled 2017-09-21 (×5): qty 1

## 2017-09-21 MED ORDER — HEPARIN (PORCINE) IN NACL 100-0.45 UNIT/ML-% IJ SOLN
1000.0000 [IU]/h | INTRAMUSCULAR | Status: DC
  Administered 2017-09-21 (×2): 1000 [IU]/h via INTRAVENOUS
  Filled 2017-09-21: qty 250

## 2017-09-21 MED ORDER — TRAZODONE HCL 50 MG PO TABS
50.0000 mg | ORAL_TABLET | Freq: Every day | ORAL | Status: DC
  Administered 2017-09-21 – 2017-09-23 (×3): 50 mg via ORAL
  Filled 2017-09-21 (×3): qty 1

## 2017-09-21 MED ORDER — LISINOPRIL 20 MG PO TABS
20.0000 mg | ORAL_TABLET | Freq: Every day | ORAL | Status: DC
  Administered 2017-09-22 – 2017-09-24 (×3): 20 mg via ORAL
  Filled 2017-09-21 (×3): qty 1

## 2017-09-21 MED ORDER — MAGNESIUM HYDROXIDE 400 MG/5ML PO SUSP
30.0000 mL | Freq: Two times a day (BID) | ORAL | Status: DC | PRN
  Filled 2017-09-21: qty 30

## 2017-09-21 MED ORDER — MAGNESIUM OXIDE 400 (241.3 MG) MG PO TABS
400.0000 mg | ORAL_TABLET | Freq: Every day | ORAL | Status: DC
  Administered 2017-09-22 – 2017-09-24 (×2): 400 mg via ORAL
  Filled 2017-09-21 (×2): qty 1

## 2017-09-21 NOTE — ED Triage Notes (Signed)
Pt coems via [facility] from [facility] with c/o right vesicular occlusion. Per EMS it was noticed Friday and pt was put on antibiotics. Per EMS VSS, [id number]. Pt is alert and answering questions at this time. Pt's right leg and foot are red and no pulse audible by MD Rifenbark.

## 2017-09-21 NOTE — H&P (Signed)
[facility] -  at [facility]   PATIENT NAME: [name] Cunningham    MR#:  [id number]  DATE OF BIRTH:  13-Sep-1928  DATE OF ADMISSION:  09/21/2017  PRIMARY CARE PHYSICIAN: Karie Schwalbe, MD   REQUESTING/REFERRING PHYSICIAN: Rifenbark  CHIEF COMPLAINT:   Right leg pain and redness HISTORY OF PRESENT ILLNESS:  [name] Cunningham  is a 84 y.o. female with a known history of chronic atrial fibrillation on Eliquis, chronic congestive heart failure, COPD, hyperlipidemia, peripheral arterial disease and multiple other medical problems is brought into the ED from [facility] for right leg pain and diminished pulses.  During my examination in the emergency department patient toes are purplish and Doppler showed biphasic waves.  As reported by the ED physician arterial ultrasound has revealed complete femoral artery occlusion.  ED physician has discussed with the on-call vascular surgery who has recommended heparin drip and will do angiogram tomorrow.  Patient is resting comfortably with no complaints.  Denies any pain in the leg, denies any chest pain or shortness of breath.  No family members at bedside.  PAST MEDICAL HISTORY:   Past Medical History:  Diagnosis Date  . ANXIETY 11/21/2008  . Atrial fibrillation (HCC)   . CAD (coronary artery disease)    Mild by cath in remote past  . CHF (congestive heart failure) (HCC)   . COPD 01/06/2007  . DIVERTICULITIS OF COLON 07/23/2009  . Fistula   . GERD 01/06/2007  . HYPERLIPIDEMIA 01/06/2007  . HYPERTENSION 01/06/2007  . PAD (peripheral artery disease) (HCC)   . SLEEP APNEA 11/21/2008    PAST SURGICAL HISTOIRY:   Past Surgical History:  Procedure Laterality Date  . ABDOMINAL HYSTERECTOMY    . APPENDECTOMY    . CATARACT EXTRACTION    . CHOLECYSTECTOMY    . colovaginal fistula repair     after partial colectomy  . GALLBLADDER SURGERY    . PARTIAL COLECTOMY     diverticulitis  . TEMPORAL ARTERY BIOPSY / LIGATION     . TONSILLECTOMY  84 YEARS OLD  . TUBAL LIGATION      SOCIAL HISTORY:   Social History   Tobacco Use  . Smoking status: Former Smoker    Types: Cigarettes  . Smokeless tobacco: Never Used  Substance Use Topics  . Alcohol use: No    Alcohol/week: 0.0 oz    FAMILY HISTORY:   Family History  Problem Relation Age of Onset  . Heart disease Father   . Asthma Father   . Cancer Mother   . Diabetes Daughter   . Colon cancer Son     DRUG ALLERGIES:   Allergies  Allergen Reactions  . Hydroxyzine Hcl Other (See Comments)    Makes patient feel like she does not want to live    REVIEW OF SYSTEMS:  CONSTITUTIONAL: No fever, fatigue or weakness.  EYES: No blurred or double vision.  EARS, NOSE, AND THROAT: No tinnitus or ear pain.  RESPIRATORY: No cough, shortness of breath, wheezing or hemoptysis.  CARDIOVASCULAR: No chest pain, orthopnea, edema.  GASTROINTESTINAL: No nausea, vomiting, diarrhea or abdominal pain.  GENITOURINARY: No dysuria, hematuria.  ENDOCRINE: No polyuria, nocturia,  HEMATOLOGY: No anemia, easy bruising or bleeding SKIN: No rash or lesion. MUSCULOSKELETAL: Reports right leg pain is better NEUROLOGIC: No tingling, numbness, weakness.  PSYCHIATRY: No anxiety or depression.   MEDICATIONS AT HOME:   Prior to Admission medications   Medication Sig Start Date End Date Taking? Authorizing Provider  apixaban Everlene Balls)  2.5 MG TABS tablet Take 2.5 mg by mouth 2 (two) times daily.   Yes [provider]  colchicine 0.6 MG tablet Take 0.3 mg by mouth daily.   Yes [provider]  diltiazem (CARDIZEM CD) 240 MG 24 hr capsule Take 240 mg by mouth daily.   Yes [provider]  furosemide (LASIX) 20 MG tablet Take 20 mg by mouth daily as needed for edema.   Yes [provider]  gabapentin (NEURONTIN) 100 MG capsule Take 100 mg by mouth 2 (two) times daily.    Yes [provider]  lisinopril (PRINIVIL,ZESTRIL) 20 MG tablet  Take 20 mg by mouth daily.   Yes [provider]  magnesium oxide (MAG-OX) 400 MG tablet Take 400 mg by mouth daily.   Yes [provider]  Melatonin 3 MG TABS Take 3 mg by mouth at bedtime.   Yes [provider]  nystatin (MYCOSTATIN/NYSTOP) powder Apply 1 application topically 2 (two) times daily.   Yes [provider]  omeprazole (PRILOSEC) 40 MG capsule Take 40 mg by mouth daily with breakfast.    Yes [provider]  sennosides-docusate sodium (SENOKOT-S) 8.6-50 MG tablet Take 2 tablets by mouth daily.   Yes [provider]  traMADol (ULTRAM) 50 MG tablet Take 50 mg by mouth every 8 (eight) hours as needed.   Yes [provider]  traZODone (DESYREL) 50 MG tablet Take 50 mg by mouth at bedtime.   Yes [provider]  vitamin B-12 (CYANOCOBALAMIN) 100 MCG tablet Take 1 tablet (100 mcg total) by mouth daily. 01/15/15  Yes Enid Baas, MD  Vitamin D, Ergocalciferol, (DRISDOL) 50000 UNITS CAPS Take 50,000 Units by mouth every 30 (thirty) days.   Yes [provider]  acetaminophen (TYLENOL) 325 MG tablet Take 2 tablets (650 mg total) by mouth every 6 (six) hours as needed for mild pain (headache). Patient not taking: Reported on 04/06/2017 01/15/15   Enid Baas, MD  albuterol (ACCUNEB) 1.25 MG/3ML nebulizer solution Take 3 mLs by nebulization once as needed for wheezing.    [provider]  albuterol (PROVENTIL) (2.5 MG/3ML) 0.083% nebulizer solution Take 2.5 mg by nebulization every 6 (six) hours as needed for wheezing or shortness of breath.    [provider]  carboxymethylcellulose (REFRESH PLUS) 0.5 % SOLN Place 1 drop into both eyes as needed.    [provider]  cephALEXin (KEFLEX) 250 MG/5ML suspension Take 10 mLs (500 mg total) by mouth every 12 (twelve) hours. For 3 days. Patient not taking: Reported on 09/21/2017 04/09/17   Shaune Pollack, MD  DIAPER RASH PRODUCTS EX Apply 1  application topically 3 (three) times daily as needed.    [provider]  loperamide (IMODIUM) 2 MG capsule Take 2-4 mg by mouth. Take 2 capsules (4mg ) by mouth at first sign of loose stool then take 1 capsule (2mg ) after each additional loose stool (max 2 doses daily)    [provider]  magnesium hydroxide (MILK OF MAGNESIA) 800 MG/5ML suspension Take 30 mLs by mouth 2 (two) times daily as needed for constipation.    [provider]  ondansetron (ZOFRAN) 4 MG tablet Take 1 tablet (4 mg total) by mouth every 6 (six) hours. As needed for nausea or vomiting Patient taking differently: Take 4 mg by mouth every 8 (eight) hours as needed for nausea or vomiting.  02/03/12   Jones Skene, MD      VITAL SIGNS:  Blood pressure (!) 119/47,  pulse 72, temperature 97.7 F (36.5 C), temperature source Oral, resp. rate 17, height 5\' 4"  (1.626 m), weight 63.5 kg (140 lb), SpO2 96 %.  PHYSICAL EXAMINATION:  GENERAL:  84 y.o.-year-old patient lying in the bed with no acute distress.  EYES: Pupils equal, round, reactive to light and accommodation. No scleral icterus. Extraocular muscles intact.  HEENT: Head atraumatic, normocephalic. Oropharynx and nasopharynx clear.  NECK:  Supple, no jugular venous distention. No thyroid enlargement, no tenderness.  LUNGS: Normal breath sounds bilaterally, no wheezing, rales,rhonchi or crepitation. No use of accessory muscles of respiration.  CARDIOVASCULAR: S1, S2 normal. No murmurs, rubs, or gallops.  ABDOMEN: Soft, nontender, nondistended. Bowel sounds present. No organomegaly or mass.  EXTREMITIES: Right lower extremity, dusky red with diminished dorsalis pedis purplish discoloration of the toes  NEUROLOGIC: Patient is awake alert and oriented x2 PSYCHIATRIC: The patient is alert and oriented x 2.  SKIN: No obvious rash, lesion, or ulcer.   LABORATORY PANEL:   CBC Recent Labs  Lab 09/21/17 1620  WBC 9.8  HGB 11.5*  HCT 33.7*  PLT  298   ------------------------------------------------------------------------------------------------------------------  Chemistries  Recent Labs  Lab 09/21/17 1620  NA 130*  K 3.9  CL 92*  CO2 25  GLUCOSE 132*  BUN 31*  CREATININE 1.03*  CALCIUM 9.2  AST 40  ALT 41  ALKPHOS 73  BILITOT 0.4   ------------------------------------------------------------------------------------------------------------------  Cardiac Enzymes No results for input(s): TROPONINI in the last 168 hours. ------------------------------------------------------------------------------------------------------------------  RADIOLOGY:  No results found.  EKG:   Orders placed or performed during the hospital encounter of 09/21/17  . ED EKG  . ED EKG  . EKG 12-Lead  . EKG 12-Lead    IMPRESSION AND PLAN:   Orlene ErmMargaret Cunningham  is a 84 y.o. female with a known history of chronic atrial fibrillation on Eliquis, chronic congestive heart failure, COPD, hyperlipidemia, peripheral arterial disease and multiple other medical problems is [facility] the ED [facility] for right leg pain and diminished pulses.  During my examination in the emergency department patient toes are purplish and Doppler showed biphasic waves.  As reported by the ED physician arterial ultrasound has revealed complete femoral artery occlusion.  ED physician has discussed with the on-call vascular surgery who has recommended heparin drip and will do angiogram tomorrow   #Right leg ischemia from complete right femoral artery occlusion with history of peripheral arterial disease Admit to MedSurg unit Heparin drip was started discontinue Eliquis N.p.o. after midnight for angiogram tomorrow by vascular surgery vascular surgery consult placed and discussed pain management as needed  #COPD no exacerbation -Albuterol nebulizer treatments as needed  #Chronic dementia-patient could provide some history Not end-stage dementia, continue  close monitoring   #Chronic atrial fibrillation-rate controlled Eliquis is on hold as patient is on heparin drip.  Continue Cardizem  #Essential hypertension continue home medication lisinopril-Cardizem CD  #Diabetes mellitus provide sliding scale insulin and check hemoglobin A1c .  Continue Neurontin for diabetic neuropathy  #Tobacco abuse disorder -reports she did quit smoking   All the records are reviewed and case discussed with ED provider. Management plans discussed with the patient, family and they are in agreement.  CODE STATUS: DNR ,son [place] HCPOA  TOTAL TIME TAKING CARE OF THIS PATIENT: 41 minutes.   Note: This dictation was prepared with Dragon dictation along with smaller phrase technology. Any transcriptional errors that result from this process are unintentional.  Ramonita LabAruna Adren Dollins M.D on 09/21/2017 at 5:36 PM  Between 7am to  6pm - Pager - [phone]  After 6pm go to [email or web address] - password [email or web address] [facility]  [place] [facility]  Office  [phone]  CC: Primary care physician; Karie Schwalbe, MD

## 2017-09-21 NOTE — ED Provider Notes (Signed)
[facility] Emergency Department Provider Note  ____________________________________________   First MD Initiated Contact with Patient 09/21/17 1613     (approximate)  I have reviewed the triage vital signs and the nursing notes.   HISTORY  Chief Complaint Claudication   HPI [name] Cunningham is a 84 y.o. female who comes to the emergency department via EMS for an abnormal ultrasound to her right leg performed as an outpatient earlier today.  She has a long-standing history of intermittent atrial fibrillation as well as polymyalgia rheumatica, diabetes mellitus, dyslipidemia, and peripheral artery disease.  She has had several days of increasing rest pain in her right foot and skin color changes.  Her pain is worse when ambulating but she does have moderate severity throbbing aching pain in the bottom of her foot right now at rest.  The pain seems to radiate up towards her knee.  Worse with ambulation improved with rest.  She denies chest pain or shortness of breath.   The patient had an arterial ultrasound performed this morning with the read: No flow is detected in the common femoral artery or femoral artery.  Flow is detected in the popliteal and distal arteries with monophasic waveforms.  Velocities are normal.  No other incidental findings.   Past Medical History:  Diagnosis Date  . ANXIETY 11/21/2008  . Atrial fibrillation (HCC)   . CAD (coronary artery disease)    Mild by cath in remote past  . CHF (congestive heart failure) (HCC)   . COPD 01/06/2007  . DIVERTICULITIS OF COLON 07/23/2009  . Fistula   . GERD 01/06/2007  . HYPERLIPIDEMIA 01/06/2007  . HYPERTENSION 01/06/2007  . PAD (peripheral artery disease) (HCC)   . SLEEP APNEA 11/21/2008    Patient Active Problem List   Diagnosis Date Noted  . Arterial occlusion 09/21/2017  . Hematochezia   . Rectal bleeding 04/06/2017  . Acute gout of left foot 01/21/2015  . Fever, unspecified 01/20/2015  .  Hypokalemia 01/19/2015  . Colitis 01/11/2015  . Diastolic CHF (HCC) 09/15/2011  . Atrial fibrillation (HCC) 09/15/2011  . PAD (peripheral artery disease) (HCC) 07/15/2011  . Edema of both legs 07/15/2011  . Palpitations 07/15/2011  . Back pain 01/28/2011  . TIA (transient ischemic attack) 06/11/2010  . TOBACCO USE 07/23/2009  . DIVERTICULITIS OF COLON 07/23/2009  . ANXIETY 11/21/2008  . SLEEP APNEA 11/21/2008  . Coronary atherosclerosis 01/04/2008  . PERIPHERAL VASCULAR DISEASE 01/04/2008  . HIATAL HERNIA 01/04/2008  . PULMONARY EMBOLISM, HX OF 01/04/2008  . DIABETES MELLITUS, TYPE II 01/17/2007  . Polymyalgia rheumatica (HCC) 01/12/2007  . HYPERLIPIDEMIA 01/06/2007  . Essential hypertension 01/06/2007  . COPD 01/06/2007  . GERD 01/06/2007    Past Surgical History:  Procedure Laterality Date  . ABDOMINAL HYSTERECTOMY    . APPENDECTOMY    . CATARACT EXTRACTION    . CHOLECYSTECTOMY    . colovaginal fistula repair     after partial colectomy  . GALLBLADDER SURGERY    . PARTIAL COLECTOMY     diverticulitis  . TEMPORAL ARTERY BIOPSY / LIGATION    . TONSILLECTOMY  84 YEARS OLD  . TUBAL LIGATION      Prior to Admission medications   Medication Sig Start Date End Date Taking? Authorizing Provider  apixaban (ELIQUIS) 2.5 MG TABS tablet Take 2.5 mg by mouth 2 (two) times daily.   Yes [provider]  colchicine 0.6 MG tablet Take 0.3 mg by mouth daily.   Yes [provider]  diltiazem (CARDIZEM CD) 240 MG 24 hr capsule Take 240 mg by mouth daily.   Yes [provider]  furosemide (LASIX) 20 MG tablet Take 20 mg by mouth daily as needed for edema.   Yes [provider]  gabapentin (NEURONTIN) 100 MG capsule Take 100 mg by mouth 2 (two) times daily.    Yes [provider]  lisinopril (PRINIVIL,ZESTRIL) 20 MG tablet Take 20 mg by mouth daily.   Yes [provider]  magnesium oxide (MAG-OX) 400 MG tablet Take 400 mg by mouth  daily.   Yes [provider]  Melatonin 3 MG TABS Take 3 mg by mouth at bedtime.   Yes [provider]  nystatin (MYCOSTATIN/NYSTOP) powder Apply 1 application topically 2 (two) times daily.   Yes [provider]  omeprazole (PRILOSEC) 40 MG capsule Take 40 mg by mouth daily with breakfast.    Yes [provider]  sennosides-docusate sodium (SENOKOT-S) 8.6-50 MG tablet Take 2 tablets by mouth daily.   Yes [provider]  traMADol (ULTRAM) 50 MG tablet Take 50 mg by mouth every 8 (eight) hours as needed.   Yes [provider]  traZODone (DESYREL) 50 MG tablet Take 50 mg by mouth at bedtime.   Yes [provider]  vitamin B-12 (CYANOCOBALAMIN) 100 MCG tablet Take 1 tablet (100 mcg total) by mouth daily. 01/15/15  Yes Enid BaasKalisetti, Radhika, MD  Vitamin D, Ergocalciferol, (DRISDOL) 50000 UNITS CAPS Take 50,000 Units by mouth every 30 (thirty) days.   Yes [provider]  acetaminophen (TYLENOL) 325 MG tablet Take 2 tablets (650 mg total) by mouth every 6 (six) hours as needed for mild pain (headache). Patient not taking: Reported on 04/06/2017 01/15/15   Enid BaasKalisetti, Radhika, MD  albuterol (ACCUNEB) 1.25 MG/3ML nebulizer solution Take 3 mLs by nebulization once as needed for wheezing.    [provider]  albuterol (PROVENTIL) (2.5 MG/3ML) 0.083% nebulizer solution Take 2.5 mg by nebulization every 6 (six) hours as needed for wheezing or shortness of breath.    [provider]  carboxymethylcellulose (REFRESH PLUS) 0.5 % SOLN Place 1 drop into both eyes as needed.    [provider]  cephALEXin (KEFLEX) 250 MG/5ML suspension Take 10 mLs (500 mg total) by mouth every 12 (twelve) hours. For 3 days. Patient not taking: Reported on 09/21/2017 04/09/17   Shaune Pollackhen, Qing, MD  DIAPER RASH PRODUCTS EX Apply 1 application topically 3 (three) times daily as needed.    [provider]  loperamide (IMODIUM) 2 MG capsule  Take 2-4 mg by mouth. Take 2 capsules (4mg ) by mouth at first sign of loose stool then take 1 capsule (2mg ) after each additional loose stool (max 2 doses daily)    [provider]  magnesium hydroxide (MILK OF MAGNESIA) 800 MG/5ML suspension Take 30 mLs by mouth 2 (two) times daily as needed for constipation.    [provider]  ondansetron (ZOFRAN) 4 MG tablet Take 1 tablet (4 mg total) by mouth every 6 (six) hours. As needed for nausea or vomiting Patient taking differently: Take 4 mg by mouth every 8 (eight) hours as needed for nausea or vomiting.  02/03/12   Jones SkeneBonk, John-Adam, MD    Allergies Hydroxyzine hcl  Family History  Problem Relation Age of Onset  . Heart disease Father   . Asthma Father   . Cancer Mother   . Diabetes Daughter   . Colon cancer Son     Social History Social History  Tobacco Use  . Smoking status: Former Smoker    Types: Cigarettes  . Smokeless tobacco: Never Used  Substance Use Topics  . Alcohol use: No    Alcohol/week: 0.0 oz  . Drug use: No    Review of Systems Constitutional: No fever/chills Eyes: No visual changes. ENT: No sore throat. Cardiovascular: Denies chest pain. Respiratory: Denies shortness of breath. Gastrointestinal: No abdominal pain.  No nausea, no vomiting.  No diarrhea.  No constipation. Genitourinary: Negative for dysuria. Musculoskeletal: Positive for leg pain Skin: Positive for skin changes Neurological: Negative for headaches, focal weakness or numbness.   ____________________________________________   PHYSICAL EXAM:  VITAL SIGNS: ED Triage Vitals  Enc Vitals Group     BP      Pulse      Resp      Temp      Temp src      SpO2      Weight      Height      Head Circumference      Peak Flow      Pain Score      Pain Loc      Pain Edu?      Excl. in GC?     Constitutional: Alert and oriented x4 pleasant cooperative speaks full clear sentences no diaphoresis Eyes: PERRL EOMI. Head:  Atraumatic. Nose: No congestion/rhinnorhea. Mouth/Throat: No trismus Neck: No stridor.   Cardiovascular: Bradycardic rate, regular rhythm. Grossly normal heart sounds.  Good peripheral circulation. Respiratory: Normal respiratory effort.  No retractions. Lungs CTAB and moving good air Gastrointestinal: Soft nontender Musculoskeletal: Right lower extremity slightly edematous with red discoloration below the ankle and purple discoloration to the toes.  Skin is actually warm.  Delayed capillary refill.  Unable to appreciate any dorsalis pedis pulse but she has a biphasic PT. Neurologic:  Normal speech and language. No gross focal neurologic deficits are appreciated. Skin: Skin discoloration as above Psychiatric: Mood and affect are normal. Speech and behavior are normal.    ____________________________________________   DIFFERENTIAL includes but not limited to  Arterial occlusion, DVT, claudication ____________________________________________   LABS (all labs ordered are listed, but only abnormal results are displayed)  Labs Reviewed  COMPREHENSIVE METABOLIC PANEL - Abnormal; Notable for the following components:      Result Value   Sodium 130 (*)    Chloride 92 (*)    Glucose, Bld 132 (*)    BUN 31 (*)    Creatinine, Ser 1.03 (*)    Albumin 3.4 (*)    GFR calc non Af Amer 48 (*)    GFR calc Af Amer 55 (*)    All other components within normal limits  CBC WITH DIFFERENTIAL/PLATELET - Abnormal; Notable for the following components:   Hemoglobin 11.5 (*)    HCT 33.7 (*)    Monocytes Absolute 1.3 (*)    All other components within normal limits  APTT - Abnormal; Notable for the following components:   aPTT 39 (*)    All other components within normal limits  HEPARIN LEVEL (UNFRACTIONATED) - Abnormal; Notable for the following components:   Heparin Unfractionated 1.36 (*)    All other components within normal limits  APTT - Abnormal; Notable for the following components:    aPTT 79 (*)    All other components within normal limits  CBC - Abnormal; Notable for the following components:   Hemoglobin 11.0 (*)    HCT 32.3 (*)    All other components within normal  limits  BASIC METABOLIC PANEL - Abnormal; Notable for the following components:   Sodium 131 (*)    Chloride 96 (*)    Glucose, Bld 108 (*)    All other components within normal limits  HEPARIN LEVEL (UNFRACTIONATED) - Abnormal; Notable for the following components:   Heparin Unfractionated 1.01 (*)    All other components within normal limits  APTT - Abnormal; Notable for the following components:   aPTT 64 (*)    All other components within normal limits  BASIC METABOLIC PANEL - Abnormal; Notable for the following components:   Sodium 133 (*)    Potassium 3.3 (*)    Chloride 99 (*)    Glucose, Bld 101 (*)    All other components within normal limits  APTT - Abnormal; Notable for the following components:   aPTT 103 (*)    All other components within normal limits  HEPARIN LEVEL (UNFRACTIONATED) - Abnormal; Notable for the following components:   Heparin Unfractionated 0.99 (*)    All other components within normal limits  MAGNESIUM - Abnormal; Notable for the following components:   Magnesium 1.3 (*)    All other components within normal limits  MRSA PCR SCREENING  LACTIC ACID, PLASMA  PROTIME-INR  APTT  HEPARIN LEVEL (UNFRACTIONATED)    Lab work reviewed by me shows slight anemia but not significant __________________________________________  EKG  ED ECG REPORT I, Merrily Brittle, the attending physician, personally viewed and interpreted this ECG.  Date: 09/21/2017 EKG Time:  Rate: 72 Rhythm: Atrial fibrillation QRS Axis: normal Intervals: normal ST/T Wave abnormalities: normal Narrative Interpretation: no evidence of acute ischemia  ____________________________________________  RADIOLOGY  Outpatient ultrasound reviewed by me shows arterial occlusion of the right common  femoral artery ____________________________________________   PROCEDURES  Procedure(s) performed: no  .Critical Care Performed by: Merrily Brittle, MD Authorized by: Merrily Brittle, MD   Critical care provider statement:    Critical care time (minutes):  30   Critical care time was exclusive of:  Separately billable procedures and treating other patients   Critical care was necessary to treat or prevent imminent or life-threatening deterioration of the following conditions: acute arterial occlusion with possible loss of limb.   Critical care was time spent personally by me on the following activities:  Development of treatment plan with patient or surrogate, discussions with consultants, evaluation of patient's response to treatment, examination of patient, obtaining history from patient or surrogate, ordering and performing treatments and interventions, ordering and review of laboratory studies, ordering and review of radiographic studies, pulse oximetry, re-evaluation of patient's condition and review of old charts    Critical Care performed: Yes  Observation: no ____________________________________________   INITIAL IMPRESSION / ASSESSMENT AND PLAN / ED COURSE  Pertinent labs & imaging results that were available during my care of the patient were reviewed by me and considered in my medical decision making (see chart for details).       ----------------------------------------- 4:29 PM on 09/21/2017 -----------------------------------------  I discussed the case with on-call vascular surgeon Dr. Gilda Crease who recommends inpatient admission on a heparin drip and he will perform an angiography tomorrow as the patient is currently having rest pain.  The patient requires inpatient admission for IV heparin and management of her critical limb ischemia.  The patient herself verbalizes understanding agreement the plan.  I then discussed with the hospitalist who has graciously agreed  to admit the patient to her service. ____________________________________________   FINAL CLINICAL IMPRESSION(S) / ED DIAGNOSES  Final diagnoses:  Arterial occlusion      NEW MEDICATIONS STARTED DURING THIS VISIT:  Current Discharge Medication List       Note:  This document was prepared using Dragon voice recognition software and may include unintentional dictation errors.     Merrily Brittle, MD 09/23/17 1452

## 2017-09-21 NOTE — Progress Notes (Signed)
ANTICOAGULATION CONSULT NOTE - Initial Consult  Pharmacy Consult for heparin gtt Indication: DVT  Allergies  Allergen Reactions  . Hydroxyzine Hcl Other (See Comments)    Makes patient feel like she does not want to live    Patient Measurements: Height: 5\' 4"  (162.6 cm) Weight: 140 lb (63.5 kg) IBW/kg (Calculated) : 54.7 Heparin Dosing Weight: 63.5kg  Vital Signs: Temp: 97.7 F (36.5 C) (06/18 1625) Temp Source: Oral (06/18 1625) BP: 128/59 (06/18 1615) Pulse Rate: 58 (06/18 1615)  Labs: Recent Labs    09/21/17 1620  HGB 11.5*  HCT 33.7*  PLT 298  APTT 39*  LABPROT 14.9  INR 1.18    CrCl cannot be calculated (Patient's most recent lab result is older than the maximum 21 days allowed.).   Medical History: Past Medical History:  Diagnosis Date  . ANXIETY 11/21/2008  . Atrial fibrillation (HCC)   . CAD (coronary artery disease)    Mild by cath in remote past  . CHF (congestive heart failure) (HCC)   . COPD 01/06/2007  . DIVERTICULITIS OF COLON 07/23/2009  . Fistula   . GERD 01/06/2007  . HYPERLIPIDEMIA 01/06/2007  . HYPERTENSION 01/06/2007  . PAD (peripheral artery disease) (HCC)   . SLEEP APNEA 11/21/2008    Medications:   (Not in a hospital admission) Scheduled:  . heparin  2,000 Units Intravenous Once   Infusions:  . heparin     PRN:  Anti-infectives (From admission, onward)   None      Assessment: 84 year old female with femoral artery occlusion but anticoagulated prior to admission on apixaban. Will start heparin gtt per pharmacy consult with reduced bolus   Goal of Therapy:  Heparin level 0.3-0.7 units/ml aPTT 66-102 seconds Monitor platelets by anticoagulation protocol: Yes   Plan:  Monitor aPTT and HL until levels alight with HL due to apixaban PTA   Give 2000 units bolus x 1 Start heparin infusion at 1000 units/hr Check anti-Xa level in 8 hours and daily while on heparin Continue to monitor H&H and platelets  Gerre PebblesGarrett  Raevin Wierenga 09/21/2017,4:47 PM

## 2017-09-21 NOTE — ED Notes (Signed)
Pt given meal tray and drink at this time. MD aware

## 2017-09-21 NOTE — ED Notes (Signed)
Called [facility] and let them know I was headed up with patient at this time. Explained delay due to no nurse available to transport pt with heparin due to CODE.

## 2017-09-22 ENCOUNTER — Encounter: Payer: Self-pay | Admitting: *Deleted

## 2017-09-22 DIAGNOSIS — F0390 Unspecified dementia without behavioral disturbance: Secondary | ICD-10-CM

## 2017-09-22 DIAGNOSIS — M79604 Pain in right leg: Secondary | ICD-10-CM

## 2017-09-22 DIAGNOSIS — J449 Chronic obstructive pulmonary disease, unspecified: Secondary | ICD-10-CM

## 2017-09-22 DIAGNOSIS — I4891 Unspecified atrial fibrillation: Secondary | ICD-10-CM

## 2017-09-22 LAB — CBC
HCT: 32.3 % — ABNORMAL LOW (ref 35.0–47.0)
Hemoglobin: 11 g/dL — ABNORMAL LOW (ref 12.0–16.0)
MCH: 27.7 pg (ref 26.0–34.0)
MCHC: 34.2 g/dL (ref 32.0–36.0)
MCV: 81 fL (ref 80.0–100.0)
Platelets: 283 10*3/uL (ref 150–440)
RBC: 3.98 MIL/uL (ref 3.80–5.20)
RDW: 13.6 % (ref 11.5–14.5)
WBC: 8.7 10*3/uL (ref 3.6–11.0)

## 2017-09-22 LAB — BASIC METABOLIC PANEL WITH GFR
Anion gap: 10 (ref 5–15)
BUN: 20 mg/dL (ref 6–20)
CO2: 25 mmol/L (ref 22–32)
Calcium: 9 mg/dL (ref 8.9–10.3)
Chloride: 96 mmol/L — ABNORMAL LOW (ref 101–111)
Creatinine, Ser: 0.64 mg/dL (ref 0.44–1.00)
GFR calc Af Amer: >60 mL/min
GFR calc non Af Amer: >60 mL/min
Glucose, Bld: 108 mg/dL — ABNORMAL HIGH (ref 65–99)
Potassium: 3.5 mmol/L (ref 3.5–5.1)
Sodium: 131 mmol/L — ABNORMAL LOW (ref 135–145)

## 2017-09-22 LAB — APTT
APTT: 79 s — AB (ref 24–36)
APTT: 64 s — AB (ref 24–36)

## 2017-09-22 LAB — HEPARIN LEVEL (UNFRACTIONATED)
HEPARIN UNFRACTIONATED: 1.36 [IU]/mL — AB (ref 0.30–0.70)
Heparin Unfractionated: 1.01 [IU]/mL — ABNORMAL HIGH (ref 0.30–0.70)

## 2017-09-22 MED ORDER — HEPARIN (PORCINE) IN NACL 100-0.45 UNIT/ML-% IJ SOLN
1100.0000 [IU]/h | INTRAMUSCULAR | Status: DC
  Administered 2017-09-22: 1100 [IU]/h via INTRAVENOUS
  Filled 2017-09-22: qty 250

## 2017-09-22 MED ORDER — HEPARIN BOLUS VIA INFUSION
1000.0000 [IU] | Freq: Once | INTRAVENOUS | Status: AC
  Administered 2017-09-22: 1000 [IU] via INTRAVENOUS
  Filled 2017-09-22: qty 1000

## 2017-09-22 MED ORDER — SODIUM CHLORIDE 0.9 % IV SOLN
INTRAVENOUS | Status: AC
  Administered 2017-09-22 (×2): via INTRAVENOUS

## 2017-09-22 MED ORDER — CEFAZOLIN SODIUM-DEXTROSE 2-4 GM/100ML-% IV SOLN
2.0000 g | INTRAVENOUS | Status: AC
  Administered 2017-09-23: 2 g via INTRAVENOUS
  Filled 2017-09-22: qty 100

## 2017-09-22 NOTE — Progress Notes (Signed)
Dr Gilda CreaseSchnier gave orders to feed patient now and change her to NPO after midnight

## 2017-09-22 NOTE — Clinical Social Work Note (Signed)
Clinical Social Work Assessment  Patient Details  Name: [name] Cunningham MRN: [id number] Date of Birth: 07/06/1928  Date of referral:  09/22/17               Reason for consult:  Discharge Planning                Permission sought to share information with:    Permission granted to share information::     Name::        Agency::     Relationship::     Contact Information:     Housing/Transportation Living arrangements for the past 2 months:  Skilled Nursing Facility Source of Information:  Facility Patient Interpreter Needed:  None Criminal Activity/Legal Involvement Pertinent to Current Situation/Hospitalization:  No - Comment as needed Significant Relationships:  Adult Children Lives with:  Facility Resident Do you feel safe going back to the place where you live?    Need for family participation in patient care:  Yes (Comment)  Care giving concerns:  Patient is a [facility].   Social Worker assessment / plan:  CSW has left patient's son: [name] Cunningham : [phone]. Patient is confused this morning. [name] at [facility] informed CSW that patient can return at discharge.   Employment status:  Retired Health and safety inspectornsurance information:  Medicare PT Recommendations:    Information / Referral to community resources:     Patient/Family's Response to care:  Awaiting call back from son.  Patient/Family's Understanding of and Emotional Response to Diagnosis, Current Treatment, and Prognosis:  Awaiting callback from son.   Emotional Assessment Appearance:  Appears stated age Attitude/Demeanor/Rapport:  (confused and yelling out this morning) Affect (typically observed):    Orientation:    Alcohol / Substance use:  Not Applicable Psych involvement (Current and /or in the community):  No (Comment)  Discharge Needs  Concerns to be addressed:  Care Coordination Readmission within the last 30 days:  No Current discharge risk:  None Barriers to Discharge:   Continued Medical Work up   [employer] CorporationMonica Aryonna Gunnerson, LCSW 09/22/2017, 11:27 AM

## 2017-09-22 NOTE — Progress Notes (Signed)
ANTICOAGULATION CONSULT NOTE - Initial Consult  Pharmacy Consult for heparin gtt Indication: DVT  Allergies  Allergen Reactions  . Hydroxyzine Hcl Other (See Comments)    Makes patient feel like she does not want to live    Patient Measurements: Height: 5\' 4"  (162.6 cm) Weight: 140 lb (63.5 kg) IBW/kg (Calculated) : 54.7 Heparin Dosing Weight: 63.5kg  Vital Signs: Temp: 98.4 F (36.9 C) (06/19 0548) Temp Source: Oral (06/19 0548) BP: 131/62 (06/19 0548) Pulse Rate: 72 (06/19 0548)  Labs: Recent Labs    09/21/17 1620 09/22/17 0444  HGB 11.5* 11.0*  HCT 33.7* 32.3*  PLT 298 283  APTT 39* 79*  LABPROT 14.9  --   INR 1.18  --   HEPARINUNFRC  --  1.36*  CREATININE 1.03* 0.64    Estimated Creatinine Clearance: 43.6 mL/min (by C-G formula based on SCr of 0.64 mg/dL).   Medical History: Past Medical History:  Diagnosis Date  . ANXIETY 11/21/2008  . Atrial fibrillation (HCC)   . CAD (coronary artery disease)    Mild by cath in remote past  . CHF (congestive heart failure) (HCC)   . COPD 01/06/2007  . DIVERTICULITIS OF COLON 07/23/2009  . Fistula   . GERD 01/06/2007  . HYPERLIPIDEMIA 01/06/2007  . HYPERTENSION 01/06/2007  . PAD (peripheral artery disease) (HCC)   . SLEEP APNEA 11/21/2008    Medications:  Medications Prior to Admission  Medication Sig Dispense Refill Last Dose  . apixaban (ELIQUIS) 2.5 MG TABS tablet Take 2.5 mg by mouth 2 (two) times daily.   09/21/2017 at GAB  . colchicine 0.6 MG tablet Take 0.3 mg by mouth daily.   09/21/2017 at Unknown time  . diltiazem (CARDIZEM CD) 240 MG 24 hr capsule Take 240 mg by mouth daily.   09/21/2017 at Unknown time  . furosemide (LASIX) 20 MG tablet Take 20 mg by mouth daily as needed for edema.   09/21/2017 at PRN  . gabapentin (NEURONTIN) 100 MG capsule Take 100 mg by mouth 2 (two) times daily.    09/21/2017 at Unknown time  . lisinopril (PRINIVIL,ZESTRIL) 20 MG tablet Take 20 mg by mouth daily.   09/21/2017 at Unknown  time  . magnesium oxide (MAG-OX) 400 MG tablet Take 400 mg by mouth daily.   09/21/2017 at Unknown time  . Melatonin 3 MG TABS Take 3 mg by mouth at bedtime.   09/20/2017 at Unknown time  . nystatin (MYCOSTATIN/NYSTOP) powder Apply 1 application topically 2 (two) times daily.   09/21/2017 at Unknown time  . omeprazole (PRILOSEC) 40 MG capsule Take 40 mg by mouth daily with breakfast.    09/21/2017 at Unknown time  . sennosides-docusate sodium (SENOKOT-S) 8.6-50 MG tablet Take 2 tablets by mouth daily.   09/21/2017 at Unknown time  . traMADol (ULTRAM) 50 MG tablet Take 50 mg by mouth every 8 (eight) hours as needed.   PRN at PRN  . traZODone (DESYREL) 50 MG tablet Take 50 mg by mouth at bedtime.   09/20/2017 at Unknown time  . vitamin B-12 (CYANOCOBALAMIN) 100 MCG tablet Take 1 tablet (100 mcg total) by mouth daily. 30 tablet 0 09/21/2017 at Unknown time  . Vitamin D, Ergocalciferol, (DRISDOL) 50000 UNITS CAPS Take 50,000 Units by mouth every 30 (thirty) days.   Past Month at Unknown time  . acetaminophen (TYLENOL) 325 MG tablet Take 2 tablets (650 mg total) by mouth every 6 (six) hours as needed for mild pain (headache). (Patient not taking: Reported on 04/06/2017)  30 tablet 0 PRN at PRN  . albuterol (ACCUNEB) 1.25 MG/3ML nebulizer solution Take 3 mLs by nebulization once as needed for wheezing.   PRN at PRN  . albuterol (PROVENTIL) (2.5 MG/3ML) 0.083% nebulizer solution Take 2.5 mg by nebulization every 6 (six) hours as needed for wheezing or shortness of breath.   PRN at PRN  . carboxymethylcellulose (REFRESH PLUS) 0.5 % SOLN Place 1 drop into both eyes as needed.   PRN at PRN  . cephALEXin (KEFLEX) 250 MG/5ML suspension Take 10 mLs (500 mg total) by mouth every 12 (twelve) hours. For 3 days. (Patient not taking: Reported on 09/21/2017) 60 mL 0 Not Taking at Unknown time  . DIAPER RASH PRODUCTS EX Apply 1 application topically 3 (three) times daily as needed.   PRN at PRN  . loperamide (IMODIUM) 2 MG capsule  Take 2-4 mg by mouth. Take 2 capsules (4mg ) by mouth at first sign of loose stool then take 1 capsule (2mg ) after each additional loose stool (max 2 doses daily)   PRN at PRN  . magnesium hydroxide (MILK OF MAGNESIA) 800 MG/5ML suspension Take 30 mLs by mouth 2 (two) times daily as needed for constipation.   PRN at PRN  . ondansetron (ZOFRAN) 4 MG tablet Take 1 tablet (4 mg total) by mouth every 6 (six) hours. As needed for nausea or vomiting (Patient taking differently: Take 4 mg by mouth every 8 (eight) hours as needed for nausea or vomiting. ) 12 tablet 0 PRN at PRN   Scheduled:  . colchicine  0.3 mg Oral Daily  . diltiazem  240 mg Oral Daily  . gabapentin  100 mg Oral BID  . lisinopril  20 mg Oral Daily  . magnesium oxide  400 mg Oral Daily  . Melatonin  5 mg Oral QHS  . nystatin   Topical BID  . pantoprazole  40 mg Oral Daily  . senna-docusate  2 tablet Oral Daily  . traZODone  50 mg Oral QHS  . vitamin B-12  100 mcg Oral Daily  . Vitamin D (Ergocalciferol)  50,000 Units Oral Q30 days   Infusions:  . heparin 1,000 Units/hr (09/21/17 1713)   PRN:  Anti-infectives (From admission, onward)   None      Assessment: 84 year old female with femoral artery occlusion but anticoagulated prior to admission on apixaban. Will start heparin gtt per pharmacy consult with reduced bolus   Goal of Therapy:  Heparin level 0.3-0.7 units/ml aPTT 66-102 seconds Monitor platelets by anticoagulation protocol: Yes   Plan:  06/19 @ 0444 HL 1.36, aPTT 79 sec. APTT therapeutic, HL still elevated from apixaban. Will continue rate of 1000 units/hr and will recheck HL/aPTT @ 1300, CBC stable.  Thomasene Rippleavid Eula Mazzola, PharmD, BCPS Clinical Pharmacist 09/22/2017

## 2017-09-22 NOTE — Clinical Social Work Note (Signed)
CSW received call back from patient's son, [name] Cunningham, and he is in agreement with having patient return to [facility] when time. He states that he is paying a bed hold. CSW will continue to follow. [name] Cunningham MSW,LCSW [phone]

## 2017-09-22 NOTE — Progress Notes (Signed)
Advanced Care Plan.  Purpose of Encounter: Prognosis and CODE STATUS. Parties in Attendance: The patient, her son and me. Patient's Decisional Capacity: No. Medical Story: [name] Cunningham  is a 84 y.o. female with a known history of chronic atrial fibrillation on Eliquis, chronic congestive heart failure, COPD, hyperlipidemia, peripheral arterial disease and multiple other medical problems is brought into the ED from [facility] for right leg pain and diminished pulses.   She is found complete femoral artery occlusion and start heparin drip.  Since she has multiple medical problems, I discussed with the and her son about her current condition, treatment plan, prognosis and CODE STATUS.  Per her son, the patient CODE STATUS is DNR. Plan:  Code Status: DNR. Time spent discussing advance care planning: 17 minutes.

## 2017-09-22 NOTE — Progress Notes (Signed)
ANTICOAGULATION CONSULT NOTE - Initial Consult  Pharmacy Consult for heparin gtt Indication: DVT  Allergies  Allergen Reactions  . Hydroxyzine Hcl Other (See Comments)    Makes patient feel like she does not want to live    Patient Measurements: Height: 5\' 4"  (162.6 cm) Weight: 140 lb (63.5 kg) IBW/kg (Calculated) : 54.7 Heparin Dosing Weight: 63.5kg  Vital Signs: Temp: 98.5 F (36.9 C) (06/19 1151) Temp Source: Oral (06/19 1151) BP: 148/65 (06/19 1151) Pulse Rate: 89 (06/19 1151)  Labs: Recent Labs    09/21/17 1620 09/22/17 0444 09/22/17 1325  HGB 11.5* 11.0*  --   HCT 33.7* 32.3*  --   PLT 298 283  --   APTT 39* 79* 64*  LABPROT 14.9  --   --   INR 1.18  --   --   HEPARINUNFRC  --  1.36* 1.01*  CREATININE 1.03* 0.64  --     Estimated Creatinine Clearance: 43.6 mL/min (by C-G formula based on SCr of 0.64 mg/dL).   Medical History: Past Medical History:  Diagnosis Date  . ANXIETY 11/21/2008  . Atrial fibrillation (HCC)   . CAD (coronary artery disease)    Mild by cath in remote past  . CHF (congestive heart failure) (HCC)   . COPD 01/06/2007  . DIVERTICULITIS OF COLON 07/23/2009  . Fistula   . GERD 01/06/2007  . HYPERLIPIDEMIA 01/06/2007  . HYPERTENSION 01/06/2007  . PAD (peripheral artery disease) (HCC)   . SLEEP APNEA 11/21/2008    Medications:  Medications Prior to Admission  Medication Sig Dispense Refill Last Dose  . apixaban (ELIQUIS) 2.5 MG TABS tablet Take 2.5 mg by mouth 2 (two) times daily.   09/21/2017 at GAB  . colchicine 0.6 MG tablet Take 0.3 mg by mouth daily.   09/21/2017 at Unknown time  . diltiazem (CARDIZEM CD) 240 MG 24 hr capsule Take 240 mg by mouth daily.   09/21/2017 at Unknown time  . furosemide (LASIX) 20 MG tablet Take 20 mg by mouth daily as needed for edema.   09/21/2017 at PRN  . gabapentin (NEURONTIN) 100 MG capsule Take 100 mg by mouth 2 (two) times daily.    09/21/2017 at Unknown time  . lisinopril (PRINIVIL,ZESTRIL) 20 MG  tablet Take 20 mg by mouth daily.   09/21/2017 at Unknown time  . magnesium oxide (MAG-OX) 400 MG tablet Take 400 mg by mouth daily.   09/21/2017 at Unknown time  . Melatonin 3 MG TABS Take 3 mg by mouth at bedtime.   09/20/2017 at Unknown time  . nystatin (MYCOSTATIN/NYSTOP) powder Apply 1 application topically 2 (two) times daily.   09/21/2017 at Unknown time  . omeprazole (PRILOSEC) 40 MG capsule Take 40 mg by mouth daily with breakfast.    09/21/2017 at Unknown time  . sennosides-docusate sodium (SENOKOT-S) 8.6-50 MG tablet Take 2 tablets by mouth daily.   09/21/2017 at Unknown time  . traMADol (ULTRAM) 50 MG tablet Take 50 mg by mouth every 8 (eight) hours as needed.   PRN at PRN  . traZODone (DESYREL) 50 MG tablet Take 50 mg by mouth at bedtime.   09/20/2017 at Unknown time  . vitamin B-12 (CYANOCOBALAMIN) 100 MCG tablet Take 1 tablet (100 mcg total) by mouth daily. 30 tablet 0 09/21/2017 at Unknown time  . Vitamin D, Ergocalciferol, (DRISDOL) 50000 UNITS CAPS Take 50,000 Units by mouth every 30 (thirty) days.   Past Month at Unknown time  . acetaminophen (TYLENOL) 325 MG tablet Take 2  tablets (650 mg total) by mouth every 6 (six) hours as needed for mild pain (headache). (Patient not taking: Reported on 04/06/2017) 30 tablet 0 PRN at PRN  . albuterol (ACCUNEB) 1.25 MG/3ML nebulizer solution Take 3 mLs by nebulization once as needed for wheezing.   PRN at PRN  . albuterol (PROVENTIL) (2.5 MG/3ML) 0.083% nebulizer solution Take 2.5 mg by nebulization every 6 (six) hours as needed for wheezing or shortness of breath.   PRN at PRN  . carboxymethylcellulose (REFRESH PLUS) 0.5 % SOLN Place 1 drop into both eyes as needed.   PRN at PRN  . cephALEXin (KEFLEX) 250 MG/5ML suspension Take 10 mLs (500 mg total) by mouth every 12 (twelve) hours. For 3 days. (Patient not taking: Reported on 09/21/2017) 60 mL 0 Not Taking at Unknown time  . DIAPER RASH PRODUCTS EX Apply 1 application topically 3 (three) times daily as  needed.   PRN at PRN  . loperamide (IMODIUM) 2 MG capsule Take 2-4 mg by mouth. Take 2 capsules (4mg ) by mouth at first sign of loose stool then take 1 capsule (2mg ) after each additional loose stool (max 2 doses daily)   PRN at PRN  . magnesium hydroxide (MILK OF MAGNESIA) 800 MG/5ML suspension Take 30 mLs by mouth 2 (two) times daily as needed for constipation.   PRN at PRN  . ondansetron (ZOFRAN) 4 MG tablet Take 1 tablet (4 mg total) by mouth every 6 (six) hours. As needed for nausea or vomiting (Patient taking differently: Take 4 mg by mouth every 8 (eight) hours as needed for nausea or vomiting. ) 12 tablet 0 PRN at PRN   Scheduled:  . colchicine  0.3 mg Oral Daily  . diltiazem  240 mg Oral Daily  . gabapentin  100 mg Oral BID  . lisinopril  20 mg Oral Daily  . magnesium oxide  400 mg Oral Daily  . Melatonin  5 mg Oral QHS  . nystatin   Topical BID  . pantoprazole  40 mg Oral Daily  . senna-docusate  2 tablet Oral Daily  . traZODone  50 mg Oral QHS  . vitamin B-12  100 mcg Oral Daily  . Vitamin D (Ergocalciferol)  50,000 Units Oral Q30 days   Infusions:  . sodium chloride 75 mL/hr at 09/22/17 1031  . heparin 1,000 Units/hr (09/22/17 0807)   PRN:  Anti-infectives (From admission, onward)   None      Assessment: 84 year old female with femoral artery occlusion but anticoagulated prior to admission on apixaban. Will start heparin gtt per pharmacy consult with reduced bolus   Goal of Therapy:  Heparin level 0.3-0.7 units/ml aPTT 66-102 seconds Monitor platelets by anticoagulation protocol: Yes   Plan:  06/19 @ 0444 HL 1.36, aPTT 79 sec. APTT therapeutic, HL still elevated from apixaban. Will continue rate of 1000 units/hr and will recheck HL/aPTT @ 1300, CBC stable.  06/19 @ 1325 HL 1.01, aPTT 64 sec. APTT subtherapeutic, HL still elevated from apixaban. Will continue increase rate to 1100 units/hr and will recheck HL/aPTT tomorrow am. There is an angiogram scheduled by  vascular but will leave follow-up order in place.  Burnis Medinodney Torri Michalski, PhamD Clinical Pharmacist 09/22/2017

## 2017-09-22 NOTE — Progress Notes (Signed)
Paged Dr. Gilda CreaseSchnier regarding possibility of procedure today as patient has not been consulted on yet. Patient has been NPO. Waiting for MD to call back.

## 2017-09-22 NOTE — Progress Notes (Addendum)
[facility] - [place] at [facility]   PATIENT NAME: [name] Cunningham    MR#:  [id number]  DATE OF BIRTH:  1928-05-21  SUBJECTIVE:  CHIEF COMPLAINT:   Chief Complaint  Patient presents with  . Claudication   The patient is demented, but complains of right leg pain. REVIEW OF SYSTEMS:  Review of Systems  Unable to perform ROS: Dementia    DRUG ALLERGIES:   Allergies  Allergen Reactions  . Hydroxyzine Hcl Other (See Comments)    Makes patient feel like she does not want to live   VITALS:  Blood pressure (!) 148/65, pulse 89, temperature 98.5 F (36.9 C), temperature source Oral, resp. rate 18, height 5\' 4"  (1.626 m), weight 140 lb (63.5 kg), SpO2 95 %. PHYSICAL EXAMINATION:  Physical Exam  Constitutional: She appears well-developed.  HENT:  Head: Normocephalic.  Mouth/Throat: Oropharynx is clear and moist.  Eyes: Pupils are equal, round, and reactive to light. Conjunctivae and EOM are normal. No scleral icterus.  Neck: Normal range of motion. Neck supple. No JVD present. No tracheal deviation present.  Cardiovascular: Normal rate, regular rhythm and normal heart sounds. Exam reveals no gallop.  No murmur heard. No right pedal pulses  Pulmonary/Chest: Effort normal and breath sounds normal. No respiratory distress. She has no wheezes. She has no rales.  Abdominal: Soft. Bowel sounds are normal. She exhibits no distension. There is no tenderness. There is no rebound.  Musculoskeletal: Normal range of motion. She exhibits no edema or tenderness.  Neurological: She is alert. No cranial nerve deficit.  Skin: No rash noted. No erythema.  Psychiatric:  Demented.   LABORATORY PANEL:  Female CBC Recent Labs  Lab 09/22/17 0444  WBC 8.7  HGB 11.0*  HCT 32.3*  PLT 283   ------------------------------------------------------------------------------------------------------------------ Chemistries  Recent Labs  Lab 09/21/17 1620 09/22/17 0444  NA 130*  131*  K 3.9 3.5  CL 92* 96*  CO2 25 25  GLUCOSE 132* 108*  BUN 31* 20  CREATININE 1.03* 0.64  CALCIUM 9.2 9.0  AST 40  --   ALT 41  --   ALKPHOS 73  --   BILITOT 0.4  --    RADIOLOGY:  No results found. ASSESSMENT AND PLAN:   [name] Cunningham  is a 84 y.o. female with a known history of chronic atrial fibrillation on Eliquis, chronic congestive heart failure, COPD, hyperlipidemia, peripheral arterial disease and multiple other medical problems is brought into the ED from [facility] for right leg pain and diminished pulses.  During my examination in the emergency department patient toes are purplish and Doppler showed biphasic waves.  As reported by the ED physician arterial ultrasound has revealed complete femoral artery occlusion.  ED physician has discussed with the on-call vascular surgery who has recommended heparin drip and will do angiogram tomorrow   #Right leg ischemia from complete right femoral artery occlusion with history of peripheral arterial disease Continue heparin drip, hold Eliquis N.p.o. after midnight for angiogram tomorrow per Dr. Gilda Crease. pain management as needed  #COPD no exacerbation -Albuterol nebulizer treatments as needed  #Chronic dementia-patient could provide some history Not end-stage dementia, continue close monitoring   #Chronic atrial fibrillation-rate controlled. Eliquis is on hold as patient is on heparin drip.  Continue Cardizem  #Essential hypertension continue home medication lisinopril-Cardizem CD  #Diabetes mellitus provide sliding scale insulin and check hemoglobin A1c .  Continue Neurontin for diabetic neuropathy  #Tobacco abuse disorder -reports she did quit smoking  Hyponatremia.  Normal saline IV and follow-up BMP.  All the records are reviewed and case discussed with Care Management/Social Worker. Management plans discussed with the patient, her son and they are in agreement.  CODE STATUS: DNR  TOTAL TIME TAKING  CARE OF THIS PATIENT: 36 minutes.   More than 50% of the time was spent in counseling/coordination of care: YES  POSSIBLE D/C IN 3 DAYS, DEPENDING ON CLINICAL CONDITION.   [name] Cunningham M.D on 09/22/2017 at 3:32 PM  Between 7am to 6pm - Pager - [phone]  After 6pm go to [email or web address] - Therapist, nutritionalpassword [email or web address]  [facility] [place] [facility]

## 2017-09-22 NOTE — Progress Notes (Signed)
Patient was coughing when trying to eat mac and cheese.  She would throat clear with thin liquids.  Order received for speech evaluation and diet change

## 2017-09-22 NOTE — NC FL2 (Signed)
[place] MEDICAID FL2 LEVEL OF CARE SCREENING TOOL     IDENTIFICATION  Patient Name: [name] Cunningham Birthdate: [birth year] Sex: female Admission Date (Current Location): 09/21/2017  [facility] and [place] Number:  Chiropodist and Address:  [facility], [address] [place], [place] [address]      Provider Number: [id number]  Attending Physician Name and Address:  Shaune Pollack, MD  Relative Name and Phone Number:       Current Level of Care: Hospital Recommended Level of Care: Skilled Nursing Facility Prior Approval Number:    Date Approved/Denied:   PASRR Number:    Discharge Plan: SNF    Current Diagnoses: Patient Active Problem List   Diagnosis Date Noted  . Arterial occlusion 09/21/2017  . Hematochezia   . Rectal bleeding 04/06/2017  . Acute gout of left foot 01/21/2015  . Fever, unspecified 01/20/2015  . Hypokalemia 01/19/2015  . Colitis 01/11/2015  . Diastolic CHF (HCC) 09/15/2011  . Atrial fibrillation (HCC) 09/15/2011  . PAD (peripheral artery disease) (HCC) 07/15/2011  . Edema of both legs 07/15/2011  . Palpitations 07/15/2011  . Back pain 01/28/2011  . TIA (transient ischemic attack) 06/11/2010  . TOBACCO USE 07/23/2009  . DIVERTICULITIS OF COLON 07/23/2009  . ANXIETY 11/21/2008  . SLEEP APNEA 11/21/2008  . Coronary atherosclerosis 01/04/2008  . PERIPHERAL VASCULAR DISEASE 01/04/2008  . HIATAL HERNIA 01/04/2008  . PULMONARY EMBOLISM, HX OF 01/04/2008  . DIABETES MELLITUS, TYPE II 01/17/2007  . Polymyalgia rheumatica (HCC) 01/12/2007  . HYPERLIPIDEMIA 01/06/2007  . Essential hypertension 01/06/2007  . COPD 01/06/2007  . GERD 01/06/2007    Orientation RESPIRATION BLADDER Height & Weight     Self  Normal Incontinent Weight: 140 lb (63.5 kg) Height:  5\' 4"  (162.6 cm)  BEHAVIORAL SYMPTOMS/MOOD NEUROLOGICAL BOWEL NUTRITION STATUS  (none) (none) Incontinent Diet(to be advanced)  AMBULATORY STATUS COMMUNICATION  OF NEEDS Skin   Total Care Verbally Normal                       Personal Care Assistance Level of Assistance  Total care       Total Care Assistance: Maximum assistance   Functional Limitations Info  (no issues)          SPECIAL CARE FACTORS FREQUENCY                       Contractures Contractures Info: Not present    Additional Factors Info  Code Status, Allergies Code Status Info: dnr Allergies Info: hydroxyzine hcl           Current Medications (09/22/2017):  This is the current hospital active medication list Current Facility-Administered Medications  Medication Dose Route Frequency Provider Last Rate Last Dose  . 0.9 %  sodium chloride infusion   Intravenous Continuous Shaune Pollack, MD 75 mL/hr at 09/22/17 1031    . acetaminophen (TYLENOL) tablet 650 mg  650 mg Oral Q6H PRN Gouru, Aruna, MD   650 mg at 09/22/17 0313  . albuterol (PROVENTIL) (2.5 MG/3ML) 0.083% nebulizer solution 2.5 mg  2.5 mg Nebulization Q6H PRN Gouru, Aruna, MD      . colchicine tablet 0.3 mg  0.3 mg Oral Daily Gouru, Aruna, MD      . diltiazem (CARDIZEM CD) 24 hr capsule 240 mg  240 mg Oral Daily Gouru, Aruna, MD   240 mg at 09/22/17 0945  . furosemide (LASIX) tablet 20 mg  20 mg Oral  Daily PRN Gouru, Aruna, MD      . gabapentin (NEURONTIN) capsule 100 mg  100 mg Oral BID Gouru, Aruna, MD   100 mg at 09/22/17 0945  . heparin ADULT infusion 100 units/mL (25000 units/23550mL sodium chloride 0.45%)  1,000 Units/hr Intravenous Continuous Gouru, Aruna, MD 10 mL/hr at 09/22/17 0807 1,000 Units/hr at 09/22/17 0807  . lisinopril (PRINIVIL,ZESTRIL) tablet 20 mg  20 mg Oral Daily Gouru, Aruna, MD   20 mg at 09/22/17 0945  . loperamide (IMODIUM) capsule 2 mg  2 mg Oral PRN Gouru, Aruna, MD      . magnesium hydroxide (MILK OF MAGNESIA) suspension 30 mL  30 mL Oral BID PRN Gouru, Aruna, MD      . magnesium oxide (MAG-OX) tablet 400 mg  400 mg Oral Daily Gouru, Aruna, MD   400 mg at 09/22/17 0946  .  Melatonin TABS 5 mg  5 mg Oral QHS Gouru, Aruna, MD      . nystatin (MYCOSTATIN/NYSTOP) topical powder   Topical BID Gouru, Aruna, MD      . ondansetron (ZOFRAN) tablet 4 mg  4 mg Oral Q8H PRN Gouru, Aruna, MD      . pantoprazole (PROTONIX) EC tablet 40 mg  40 mg Oral Daily Gouru, Aruna, MD   40 mg at 09/22/17 0946  . polyvinyl alcohol (LIQUIFILM TEARS) 1.4 % ophthalmic solution 1 drop  1 drop Both Eyes Daily PRN Gouru, Aruna, MD      . senna-docusate (Senokot-S) tablet 2 tablet  2 tablet Oral Daily Gouru, Aruna, MD   2 tablet at 09/22/17 0945  . traMADol (ULTRAM) tablet 50 mg  50 mg Oral Q6H PRN Gouru, Aruna, MD   50 mg at 09/22/17 1029  . traZODone (DESYREL) tablet 50 mg  50 mg Oral QHS Gouru, Deanna ArtisAruna, MD   50 mg at 09/21/17 2146  . vitamin B-12 (CYANOCOBALAMIN) tablet 100 mcg  100 mcg Oral Daily Gouru, Aruna, MD   100 mcg at 09/22/17 0945  . Vitamin D (Ergocalciferol) (DRISDOL) capsule 50,000 Units  50,000 Units Oral Q30 days Ramonita LabGouru, Aruna, MD   50,000 Units at 09/22/17 0945     Discharge Medications: Please see discharge summary for a list of discharge medications.  Relevant Imaging Results:  Relevant Lab Results:   Additional Information    York SpanielMonica Addalyne Vandehei, LCSW

## 2017-09-22 NOTE — Consult Note (Signed)
[facility] Vascular Consult Note  MRN : [id number]  [name] Cunningham is a 84 y.o. (21-Mar-1929) female who presents with chief complaint of  Chief Complaint  Patient presents with  . Claudication  .  History of Present Illness:  I am asked to evaluate the patient by Dr. Amado Coe.  The patient was brought from [facility] nursing care with a complaint of increased pain of the right.  She has a known history of peripheral vascular disease.  She is a very poor historian she has advanced Alzheimer's.  On evaluation in the emergency room her foot is noted to be pink to red in color and warm with biphasic Doppler signals there is some dusky changes to the toes and the plantar surface of the great toe.  In the emergency room she was noted to deny pain.  Today at the time of my interview there is no complaint of pain.  Per the son the patient is minimally ambulatory at this point.  Current Meds  Medication Sig  . apixaban (ELIQUIS) 2.5 MG TABS tablet Take 2.5 mg by mouth 2 (two) times daily.  . colchicine 0.6 MG tablet Take 0.3 mg by mouth daily.  Marland Kitchen diltiazem (CARDIZEM CD) 240 MG 24 hr capsule Take 240 mg by mouth daily.  . furosemide (LASIX) 20 MG tablet Take 20 mg by mouth daily as needed for edema.  . gabapentin (NEURONTIN) 100 MG capsule Take 100 mg by mouth 2 (two) times daily.   Marland Kitchen lisinopril (PRINIVIL,ZESTRIL) 20 MG tablet Take 20 mg by mouth daily.  . magnesium oxide (MAG-OX) 400 MG tablet Take 400 mg by mouth daily.  . Melatonin 3 MG TABS Take 3 mg by mouth at bedtime.  Marland Kitchen nystatin (MYCOSTATIN/NYSTOP) powder Apply 1 application topically 2 (two) times daily.  Marland Kitchen omeprazole (PRILOSEC) 40 MG capsule Take 40 mg by mouth daily with breakfast.   . sennosides-docusate sodium (SENOKOT-S) 8.6-50 MG tablet Take 2 tablets by mouth daily.  . traMADol (ULTRAM) 50 MG tablet Take 50 mg by mouth every 8 (eight) hours as needed.  . traZODone (DESYREL) 50 MG tablet Take 50 mg by  mouth at bedtime.  . vitamin B-12 (CYANOCOBALAMIN) 100 MCG tablet Take 1 tablet (100 mcg total) by mouth daily.  . Vitamin D, Ergocalciferol, (DRISDOL) 50000 UNITS CAPS Take 50,000 Units by mouth every 30 (thirty) days.    Past Medical History:  Diagnosis Date  . ANXIETY 11/21/2008  . Atrial fibrillation (HCC)   . CAD (coronary artery disease)    Mild by cath in remote past  . CHF (congestive heart failure) (HCC)   . COPD 01/06/2007  . DIVERTICULITIS OF COLON 07/23/2009  . Fistula   . GERD 01/06/2007  . HYPERLIPIDEMIA 01/06/2007  . HYPERTENSION 01/06/2007  . PAD (peripheral artery disease) (HCC)   . SLEEP APNEA 11/21/2008    Past Surgical History:  Procedure Laterality Date  . ABDOMINAL HYSTERECTOMY    . APPENDECTOMY    . CATARACT EXTRACTION    . CHOLECYSTECTOMY    . colovaginal fistula repair     after partial colectomy  . GALLBLADDER SURGERY    . PARTIAL COLECTOMY     diverticulitis  . TEMPORAL ARTERY BIOPSY / LIGATION    . TONSILLECTOMY  84 YEARS OLD  . TUBAL LIGATION      Social History Social History   Tobacco Use  . Smoking status: Former Smoker    Types: Cigarettes  . Smokeless tobacco: Never Used  Substance Use Topics  . Alcohol use: No    Alcohol/week: 0.0 oz  . Drug use: No    Family History Family History  Problem Relation Age of Onset  . Heart disease Father   . Asthma Father   . Cancer Mother   . Diabetes Daughter   . Colon cancer Son   No family history of bleeding/clotting disorders, porphyria or autoimmune disease   Allergies  Allergen Reactions  . Hydroxyzine Hcl Other (See Comments)    Makes patient feel like she does not want to live     REVIEW OF SYSTEMS (Negative unless checked)  Constitutional: [] Weight loss  [] Fever  [] Chills Cardiac: [] Chest pain   [] Chest pressure   [] Palpitations   [] Shortness of breath when laying flat   [] Shortness of breath at rest   [] Shortness of breath with exertion. Vascular:  [] Pain in legs with  walking   [] Pain in legs at rest   [] Pain in legs when laying flat   [] Claudication   [] Pain in feet when walking  [] Pain in feet at rest  [] Pain in feet when laying flat   [] History of DVT   [] Phlebitis   [] Swelling in legs   [] Varicose veins   [] Non-healing ulcers Pulmonary:   [] Uses home oxygen   [] Productive cough   [] Hemoptysis   [] Wheeze  [] COPD   [] Asthma Neurologic:  [] Dizziness  [] Blackouts   [] Seizures   [] History of stroke   [] History of TIA  [] Aphasia   [] Temporary blindness   [] Dysphagia   [] Weakness or numbness in arms   [] Weakness or numbness in legs Musculoskeletal:  [] Arthritis   [] Joint swelling   [] Joint pain   [] Low back pain Hematologic:  [] Easy bruising  [] Easy bleeding   [] Hypercoagulable state   [] Anemic  [] Hepatitis Gastrointestinal:  [] Blood in stool   [] Vomiting blood  [] Gastroesophageal reflux/heartburn   [] Difficulty swallowing. Genitourinary:  [] Chronic kidney disease   [] Difficult urination  [] Frequent urination  [] Burning with urination   [] Blood in urine Skin:  [] Rashes   [] Ulcers   [] Wounds Psychological:  [] History of anxiety   []  History of major depression.    Physical Examination  Vitals:   09/21/17 2104 09/22/17 0548 09/22/17 0848 09/22/17 1151  BP: (!) 164/74 131/62 135/61 (!) 148/65  Pulse: 60 72 67 89  Resp: 20 20  18   Temp: 98.4 F (36.9 C) 98.4 F (36.9 C)  98.5 F (36.9 C)  TempSrc: Oral Oral  Oral  SpO2: 98% 95%  95%  Weight:      Height:       Body mass index is 24.03 kg/m.  Head: [place]/AT, No temporalis wasting.  Ear/Nose/Throat: Nares w/o erythema or drainage, oropharynx w/o obsrtuction,   Eyes: PERRLA, Sclera nonicteric.  Neck: Supple, no nuchal rigidity.  No JVD.  Pulmonary: No audible wheezing, no use of accessory muscles.  Cardiac: RRR, normal S1, S2, no Murmurs, rubs or gallops. Vascular: Right foot is warm to the touch with 1 second capillary refill and biphasic DP and posterior tibial signals pulses are nonpalpable   Gastrointestinal: soft, non-tender, non-distended.  Musculoskeletal: Moves all extremities.  No deformity or atrophy. No edema. Neurologic: CN 2-12 intact. Symmetrical.  Speech is fluent.  Psychiatric: Advanced Alzheimer's with minimal communication. Dermatologic: No rashes or ulcers noted.  No cellulitis or open wounds. Lymph : No Cervical,  or Inguinal lymphadenopathy.      CBC Lab Results  Component Value Date   WBC 8.7 09/22/2017   HGB 11.0 (L)  09/22/2017   HCT 32.3 (L) 09/22/2017   MCV 81.0 09/22/2017   PLT 283 09/22/2017    BMET    Component Value Date/Time   NA 131 (L) 09/22/2017 0444   NA 133 (L) 06/20/2012 1125   K 3.5 09/22/2017 0444   K 3.8 06/20/2012 1125   CL 96 (L) 09/22/2017 0444   CL 103 06/20/2012 1125   CO2 25 09/22/2017 0444   CO2 22 06/20/2012 1125   GLUCOSE 108 (H) 09/22/2017 0444   GLUCOSE 112 (H) 06/20/2012 1125   GLUCOSE 117 (H) 02/09/2006 1534   BUN 20 09/22/2017 0444   BUN 19 (H) 06/20/2012 1125   CREATININE 0.64 09/22/2017 0444   CREATININE 0.98 06/20/2012 1125   CREATININE 0.99 10/02/2011 1543   CALCIUM 9.0 09/22/2017 0444   CALCIUM 8.7 06/20/2012 1125   GFRNONAA >60 09/22/2017 0444   GFRNONAA 54 (L) 06/20/2012 1125   GFRAA >60 09/22/2017 0444   GFRAA >60 06/20/2012 1125   Estimated Creatinine Clearance: 43.6 mL/min (by C-G formula based on SCr of 0.64 mg/dL).  COAG Lab Results  Component Value Date   INR 1.18 09/21/2017   INR 1.48 04/06/2017   INR 1.82 (H) 01/21/2015      Assessment/Plan 1.  Atherosclerotic occlusive disease with increased pain: This likely represents a change in her baseline chronic peripheral arterial disease akin to acute coronary syndrome.  She appears to have showered her toes but is otherwise compensated and not profoundly ischemic.  She is on heparin and her Eliquis is now been stopped.  I will plan for angiography tomorrow with the hope for intervention as maximizing her perfusion will help heal the  injury to her toes.  I have discussed the clinical scenario with her two sons discussions also included her overall comorbidities and trying to minimize invasiveness.  At this point in time all are in agreement with angiography and the hope for minimally invasive revascularization.   A total of 70 minutes was spent with this patient and her family and greater than 50% was spent in counseling and coordination of care with the patient.  Discussion included the treatment options for vascular disease including indications for surgery and intervention.  Also discussed is the appropriate timing of treatment.  In addition medical therapy was discussed.  2.  COPD:  Albuterol nebulizer treatments as needed  3.  Chronic dementia: patient could not provide any history for me Near end-stage dementia, continue close monitoring  4. Chronic atrial fibrillation: rate controlled,  Eliquis is on hold as patient is on heparin drip.  Continue Cardizem  5.  Essential hypertension:  continue home medication lisinopril-Cardizem CD  6.  Diabetes mellitus:  provide sliding scale insulin and check hemoglobin A1c .  Continue Neurontin for diabetic neuropathy     Levora DredgeGregory Lillion Elbert, MD  09/22/2017 7:06 PM

## 2017-09-23 ENCOUNTER — Encounter
Admission: EM | Disposition: A | Discharge status: Skilled Nursing Facility | Payer: Self-pay | Source: Home / Self Care | Attending: Internal Medicine

## 2017-09-23 DIAGNOSIS — I70221 Atherosclerosis of native arteries of extremities with rest pain, right leg: Secondary | ICD-10-CM

## 2017-09-23 HISTORY — PX: LOWER EXTREMITY INTERVENTION: CATH118252

## 2017-09-23 LAB — BASIC METABOLIC PANEL
ANION GAP: 12 (ref 5–15)
BUN: 11 mg/dL (ref 6–20)
CHLORIDE: 99 mmol/L — AB (ref 101–111)
CO2: 22 mmol/L (ref 22–32)
Calcium: 8.9 mg/dL (ref 8.9–10.3)
Creatinine, Ser: 0.67 mg/dL (ref 0.44–1.00)
GFR calc Af Amer: >60 mL/min (ref >60)
GLUCOSE: 101 mg/dL — AB (ref 65–99)
POTASSIUM: 3.3 mmol/L — AB (ref 3.5–5.1)
Sodium: 133 mmol/L — ABNORMAL LOW (ref 135–145)

## 2017-09-23 LAB — MAGNESIUM: MAGNESIUM: 1.3 mg/dL — AB (ref 1.7–2.4)

## 2017-09-23 LAB — APTT
APTT: 99 s — AB (ref 24–36)
aPTT: 103 seconds — ABNORMAL HIGH (ref 24–36)

## 2017-09-23 LAB — HEPARIN LEVEL (UNFRACTIONATED)
HEPARIN UNFRACTIONATED: 0.71 [IU]/mL — AB (ref 0.30–0.70)
Heparin Unfractionated: 0.99 IU/mL — ABNORMAL HIGH (ref 0.30–0.70)

## 2017-09-23 SURGERY — LOWER EXTREMITY INTERVENTION
Anesthesia: Moderate Sedation

## 2017-09-23 MED ORDER — HYDROMORPHONE HCL 1 MG/ML IJ SOLN
INTRAMUSCULAR | Status: AC
  Filled 2017-09-23: qty 0.5

## 2017-09-23 MED ORDER — POTASSIUM CHLORIDE CRYS ER 20 MEQ PO TBCR
40.0000 meq | EXTENDED_RELEASE_TABLET | Freq: Once | ORAL | Status: AC
  Administered 2017-09-23: 40 meq via ORAL
  Filled 2017-09-23: qty 2

## 2017-09-23 MED ORDER — IOPAMIDOL (ISOVUE-300) INJECTION 61%
INTRAVENOUS | Status: DC | PRN
  Administered 2017-09-23: 65 mL via INTRAVENOUS

## 2017-09-23 MED ORDER — HEPARIN (PORCINE) IN NACL 100-0.45 UNIT/ML-% IJ SOLN: 1100.0000 [IU]/h | INTRAMUSCULAR | Status: DC

## 2017-09-23 MED ORDER — FENTANYL CITRATE (PF) 100 MCG/2ML IJ SOLN
INTRAMUSCULAR | Status: DC | PRN
  Administered 2017-09-23: 50 ug via INTRAVENOUS
  Administered 2017-09-23: 25 ug via INTRAVENOUS
  Administered 2017-09-23 (×2): 50 ug via INTRAVENOUS
  Administered 2017-09-23: 25 ug via INTRAVENOUS

## 2017-09-23 MED ORDER — HEPARIN SODIUM (PORCINE) 1000 UNIT/ML IJ SOLN
INTRAMUSCULAR | Status: DC | PRN
  Administered 2017-09-23: 3000 [IU] via INTRAVENOUS

## 2017-09-23 MED ORDER — LIDOCAINE HCL 1 % IJ SOLN
INTRAMUSCULAR | Status: DC | PRN
  Administered 2017-09-23: 10 mL via INTRADERMAL

## 2017-09-23 MED ORDER — SODIUM CHLORIDE 0.9 % IV SOLN
INTRAVENOUS | Status: DC
  Administered 2017-09-23 – 2017-09-24 (×2): via INTRAVENOUS

## 2017-09-23 MED ORDER — MIDAZOLAM HCL 2 MG/2ML IJ SOLN
INTRAMUSCULAR | Status: DC | PRN
  Administered 2017-09-23 (×2): 0.5 mg via INTRAVENOUS

## 2017-09-23 MED ORDER — HEPARIN SODIUM (PORCINE) 1000 UNIT/ML IJ SOLN
INTRAMUSCULAR | Status: AC
  Filled 2017-09-23: qty 1

## 2017-09-23 MED ORDER — HYDROMORPHONE HCL 1 MG/ML IJ SOLN
0.5000 mg | Freq: Once | INTRAMUSCULAR | Status: AC
  Administered 2017-09-23: 0.5 mg via INTRAVENOUS

## 2017-09-23 MED ORDER — FENTANYL CITRATE (PF) 100 MCG/2ML IJ SOLN
INTRAMUSCULAR | Status: AC
  Filled 2017-09-23: qty 2

## 2017-09-23 MED ORDER — HEPARIN (PORCINE) IN NACL 100-0.45 UNIT/ML-% IJ SOLN
1100.0000 [IU]/h | INTRAMUSCULAR | Status: DC
  Administered 2017-09-23 (×2): 1100 [IU]/h via INTRAVENOUS
  Filled 2017-09-23: qty 250

## 2017-09-23 MED ORDER — MAGNESIUM SULFATE 4 GM/100ML IV SOLN
4.0000 g | Freq: Once | INTRAVENOUS | Status: AC
  Administered 2017-09-23: 4 g via INTRAVENOUS
  Filled 2017-09-23: qty 100

## 2017-09-23 MED ORDER — MIDAZOLAM HCL 2 MG/2ML IJ SOLN
INTRAMUSCULAR | Status: AC
  Filled 2017-09-23: qty 2

## 2017-09-23 MED ORDER — LIDOCAINE HCL (PF) 1 % IJ SOLN
INTRAMUSCULAR | Status: AC
  Filled 2017-09-23: qty 30

## 2017-09-23 MED ORDER — HEPARIN (PORCINE) IN NACL 1000-0.9 UT/500ML-% IV SOLN
INTRAVENOUS | Status: AC
  Filled 2017-09-23: qty 1000

## 2017-09-23 SURGICAL SUPPLY — 22 items
BALLN DORADO 4X200X135 (BALLOONS) ×3
BALLN ULTRASCORE 5X300X130 (BALLOONS) ×3
BALLOON DORADO 4X200X135 (BALLOONS) ×1 IMPLANT
BALLOON ULTRASCORE 5X300X130 (BALLOONS) ×1 IMPLANT
CATH BEACON 5 .035 65 KMP TIP (CATHETERS) ×3 IMPLANT
CATH BEACON 5 .035 65 RIM TIP (CATHETERS) ×3 IMPLANT
CATH BEACON 5 .038 100 VERT TP (CATHETERS) ×3 IMPLANT
CATH PIG 70CM (CATHETERS) ×3 IMPLANT
DEVICE PRESTO INFLATION (MISCELLANEOUS) ×3 IMPLANT
DEVICE STARCLOSE SE CLOSURE (Vascular Products) ×3 IMPLANT
DEVICE TORQUE .025-.038 (MISCELLANEOUS) ×3 IMPLANT
NEEDLE ENTRY 21GA 7CM ECHOTIP (NEEDLE) ×3 IMPLANT
PACK ANGIOGRAPHY (CUSTOM PROCEDURE TRAY) ×3 IMPLANT
SET INTRO CAPELLA COAXIAL (SET/KITS/TRAYS/PACK) ×3 IMPLANT
SHEATH ANL2 6FRX45 HC (SHEATH) ×3 IMPLANT
SHEATH BRITE TIP 5FRX11 (SHEATH) ×3 IMPLANT
SHIELD X-DRAPE GOLD 12X17 (MISCELLANEOUS) ×3 IMPLANT
SYR MEDRAD MARK V 150ML (SYRINGE) ×3 IMPLANT
TUBING CONTRAST HIGH PRESS 72 (TUBING) ×3 IMPLANT
WIRE AQUATRACK .035X260CM (WIRE) ×3 IMPLANT
WIRE J 3MM .035X145CM (WIRE) ×3 IMPLANT
WIRE MAGIC TORQUE 260C (WIRE) ×3 IMPLANT

## 2017-09-23 NOTE — Progress Notes (Signed)
Dr. Gilda CreaseSchnier in to talk to family regarding findings and treatment.

## 2017-09-23 NOTE — Progress Notes (Addendum)
[employer] - [place] [facility]   PATIENT NAME: [name] Cunningham    MR#:  [id number]  DATE OF BIRTH:  04/11/1928  SUBJECTIVE:  CHIEF COMPLAINT:   Chief Complaint  Patient presents with  . Claudication   The patient has no complains. REVIEW OF SYSTEMS:  Review of Systems  Unable to perform ROS: Dementia    DRUG ALLERGIES:   Allergies  Allergen Reactions  . Hydroxyzine Hcl Other (See Comments)    Makes patient feel like she does not want to live   VITALS:  Blood pressure (!) 160/71, pulse 73, temperature 98.2 F (36.8 C), temperature source Oral, resp. rate 12, height 5\' 4"  (1.626 m), weight 140 lb (63.5 kg), SpO2 96 %. PHYSICAL EXAMINATION:  Physical Exam  Constitutional: She appears well-developed.  HENT:  Head: Normocephalic.  Mouth/Throat: Oropharynx is clear and moist.  Eyes: Pupils are equal, round, and reactive to light. Conjunctivae and EOM are normal. No scleral icterus.  Neck: Normal range of motion. Neck supple. No JVD present. No tracheal deviation present.  Cardiovascular: Normal rate, regular rhythm and normal heart sounds. Exam reveals no gallop.  No murmur heard. No right pedal pulses  Pulmonary/Chest: Effort normal and breath sounds normal. No respiratory distress. She has no wheezes. She has no rales.  Abdominal: Soft. Bowel sounds are normal. She exhibits no distension. There is no tenderness. There is no rebound.  Musculoskeletal: Normal range of motion. She exhibits no edema or tenderness.  Neurological: She is alert. No cranial nerve deficit.  Skin: No rash noted. No erythema.  Psychiatric:  Demented.   LABORATORY PANEL:  Female CBC Recent Labs  Lab 09/22/17 0444  WBC 8.7  HGB 11.0*  HCT 32.3*  PLT 283   ------------------------------------------------------------------------------------------------------------------ Chemistries  Recent Labs  Lab 09/21/17 1620  09/23/17 0452  NA 130*   < > 133*  K 3.9   < >  3.3*  CL 92*   < > 99*  CO2 25   < > 22  GLUCOSE 132*   < > 101*  BUN 31*   < > 11  CREATININE 1.03*   < > 0.67  CALCIUM 9.2   < > 8.9  MG  --   --  1.3*  AST 40  --   --   ALT 41  --   --   ALKPHOS 73  --   --   BILITOT 0.4  --   --    < > = values in this interval not displayed.   RADIOLOGY:  No results found. ASSESSMENT AND PLAN:   [name] Cunningham  is a 84 y.o. female with a known history of chronic atrial fibrillation on Eliquis, chronic congestive heart failure, COPD, hyperlipidemia, peripheral arterial disease and multiple other medical problems is [facility] ED [facility] right leg pain and diminished pulses.  During my examination in the emergency department patient toes are purplish and Doppler showed biphasic waves.  As reported by the ED physician arterial ultrasound has revealed complete femoral artery occlusion.  ED physician has discussed with the on-call vascular surgery who has recommended heparin drip and will do angiogram.   #Right leg ischemia from complete right femoral artery occlusion with history of peripheral arterial disease Continue heparin drip, hold Eliquis Angiogram today per Dr. Gilda CreaseSchnier. pain management as needed  #COPD no exacerbation -Albuterol nebulizer treatments as needed  #Chronic dementia-patient could provide some history Not end-stage dementia, continue close monitoring   #  Chronic atrial fibrillation-rate controlled. Eliquis is on hold as patient is on heparin drip.  Continue Cardizem Chronic diastolic CHF.  Stable.  #Essential hypertension continue home medication lisinopril-Cardizem CD  #Diabetes mellitus provide sliding scale insulin and check hemoglobin A1c .  Continue Neurontin for diabetic neuropathy  #Tobacco abuse disorder -reports she did quit smoking  Hyponatremia.  Improving with normal saline IV. Hypokalemia and hypomagnesemia.  Give potassium and magnesium supplement.  All the records are reviewed  and case discussed with Care Management/Social Worker. Management plans discussed with the patient, her sons and they are in agreement.  CODE STATUS: DNR  TOTAL TIME TAKING CARE OF THIS PATIENT: 36 minutes.   More than 50% of the time was spent in counseling/coordination of care: YES  POSSIBLE D/C IN 3 DAYS, DEPENDING ON CLINICAL CONDITION.   [name] Cunningham M.D on 09/23/2017 at 1:58 PM  Between 7am to 6pm - Pager - [phone]  After 6pm go to [email or web address] - Therapist, nutritional Hospitalists

## 2017-09-23 NOTE — Evaluation (Signed)
Clinical/Bedside Swallow Evaluation Patient Details  Name: [name] Cunningham MRN: [id number] Date of Birth: [birth year]  Today's Date: 09/23/2017 Time: SLP Start Time (ACUTE ONLY): 1340 SLP Stop Time (ACUTE ONLY): 1410 SLP Time Calculation (min) (ACUTE ONLY): 30 min  Past Medical History:  Past Medical History:  Diagnosis Date  . ANXIETY 11/21/2008  . Atrial fibrillation (HCC)   . CAD (coronary artery disease)    Mild by cath in remote past  . CHF (congestive heart failure) (HCC)   . COPD 01/06/2007  . DIVERTICULITIS OF COLON 07/23/2009  . Fistula   . GERD 01/06/2007  . HYPERLIPIDEMIA 01/06/2007  . HYPERTENSION 01/06/2007  . PAD (peripheral artery disease) (HCC)   . SLEEP APNEA 11/21/2008   Past Surgical History:  Past Surgical History:  Procedure Laterality Date  . ABDOMINAL HYSTERECTOMY    . APPENDECTOMY    . CATARACT EXTRACTION    . CHOLECYSTECTOMY    . colovaginal fistula repair     after partial colectomy  . GALLBLADDER SURGERY    . PARTIAL COLECTOMY     diverticulitis  . TEMPORAL ARTERY BIOPSY / LIGATION    . TONSILLECTOMY  84 YEARS OLD  . TUBAL LIGATION     HPI:      Assessment / Plan / Recommendation Clinical Impression  pt presents with a mild to moderate oral pharyngeal dysphagia as characterized by poor mastication and expelling of mechanical soft and solid bolus trials. pt had no overt ssx aspiration with puree trials and ice chips. pt had no ssx aspiratiion with thin liquids via cup however did note wet vocal quality with thin via straw. slp recommends to upgrade diet to dys 1 with thin liquids no straw. NSG and pt educated and provided verbal agreement.  SLP Visit Diagnosis: Dysphagia, oropharyngeal phase (R13.12)    Aspiration Risk  Mild aspiration risk;Moderate aspiration risk    Diet Recommendation Dysphagia 1 (Puree);Thin liquid   Liquid Administration via: Cup;No straw Medication Administration: Crushed with puree Supervision: Patient able to self  feed Compensations: Slow rate;Small sips/bites;Follow solids with liquid;Minimize environmental distractions Postural Changes: Seated upright at 90 degrees    Other  Recommendations Oral Care Recommendations: Oral care BID;Staff/trained caregiver to provide oral care   Follow up Recommendations        Frequency and Duration min 3x week  2 weeks       Prognosis Prognosis for Safe Diet Advancement: Good Barriers to Reach Goals: Cognitive deficits      Swallow Study   General Date of Onset: 09/22/17 Type of Study: Bedside Swallow Evaluation Diet Prior to this Study: Dysphagia 1 (puree);Nectar-thick liquids Temperature Spikes Noted: No Respiratory Status: Room air History of Recent Intubation: No Behavior/Cognition: Alert;Cooperative;Pleasant mood;Confused Oral Cavity Assessment: Within Functional Limits Oral Care Completed by SLP: No Oral Cavity - Dentition: Adequate natural dentition Vision: Functional for self-feeding Self-Feeding Abilities: Able to feed self Patient Positioning: Upright in bed Baseline Vocal Quality: Normal Volitional Cough: Weak Volitional Swallow: Able to elicit    Oral/Motor/Sensory Function Overall Oral Motor/Sensory Function: Within functional limits   Ice Chips Ice chips: Within functional limits   Thin Liquid Thin Liquid: Within functional limits Presentation: Cup;Self Fed Other Comments: pt had wet vocal quality with thin via straw.     Nectar Thick Nectar Thick Liquid: Not tested   Honey Thick Honey Thick Liquid: Not tested   Puree Puree: Within functional limits Presentation: Spoon   Solid   GO   Solid: Impaired Oral Phase Impairments: Impaired mastication  Oral Phase Functional Implications: Left anterior spillage;Right anterior spillage;Oral residue;Oral holding Other Comments: pt spit out ms and regular textures.        Aarica Wax Harris Sauber 09/23/2017,2:21 PM

## 2017-09-23 NOTE — Progress Notes (Signed)
Pharmacy consulted for heparin gtt dosing. Dr Gilda CreaseSchnier called myself and asked to restart heparin iv 1100units/hr (previous rate) now, without bolus. Will recheck aPTT and HL at 2030, 8 hours after restart per pharmacy protocol.   Luan PullingGarrett Alannie Amodio, PharmD, MBA, BCGP Clinical Pharmacist

## 2017-09-24 ENCOUNTER — Encounter: Payer: Self-pay | Admitting: Vascular Surgery

## 2017-09-24 LAB — BASIC METABOLIC PANEL
ANION GAP: 11 (ref 5–15)
BUN: 10 mg/dL (ref 6–20)
CHLORIDE: 101 mmol/L (ref 101–111)
CO2: 22 mmol/L (ref 22–32)
Calcium: 8.9 mg/dL (ref 8.9–10.3)
Creatinine, Ser: 0.52 mg/dL (ref 0.44–1.00)
GFR calc Af Amer: >60 mL/min (ref >60)
GFR calc non Af Amer: >60 mL/min (ref >60)
Glucose, Bld: 120 mg/dL — ABNORMAL HIGH (ref 65–99)
POTASSIUM: 3.4 mmol/L — AB (ref 3.5–5.1)
Sodium: 134 mmol/L — ABNORMAL LOW (ref 135–145)

## 2017-09-24 LAB — APTT: APTT: 93 s — AB (ref 24–36)

## 2017-09-24 LAB — MAGNESIUM: Magnesium: 2 mg/dL (ref 1.7–2.4)

## 2017-09-24 LAB — HEPARIN LEVEL (UNFRACTIONATED): Heparin Unfractionated: 0.63 IU/mL (ref 0.30–0.70)

## 2017-09-24 LAB — GLUCOSE, CAPILLARY: GLUCOSE-CAPILLARY: 144 mg/dL — AB (ref 65–99)

## 2017-09-24 MED ORDER — MELATONIN 5 MG PO TABS: 10.0000 mg | ORAL_TABLET | Freq: Every day | ORAL | Status: DC

## 2017-09-24 MED ORDER — MELATONIN 5 MG PO TABS
5.0000 mg | ORAL_TABLET | Freq: Every day | ORAL | Status: DC
  Filled 2017-09-24: qty 1

## 2017-09-24 MED ORDER — APIXABAN 5 MG PO TABS: 5.0000 mg | ORAL_TABLET | Freq: Two times a day (BID) | ORAL | Status: AC

## 2017-09-24 MED ORDER — APIXABAN 5 MG PO TABS
5.0000 mg | ORAL_TABLET | Freq: Two times a day (BID) | ORAL | Status: DC
  Administered 2017-09-24: 5 mg via ORAL
  Filled 2017-09-24: qty 1

## 2017-09-24 MED ORDER — POTASSIUM CHLORIDE CRYS ER 20 MEQ PO TBCR
40.0000 meq | EXTENDED_RELEASE_TABLET | Freq: Once | ORAL | Status: AC
  Administered 2017-09-24: 40 meq via ORAL
  Filled 2017-09-24: qty 2

## 2017-09-24 MED ORDER — MELATONIN 5 MG PO TABS: 5.0000 mg | ORAL_TABLET | Freq: Every day | ORAL | 0 refills | Status: AC

## 2017-09-24 NOTE — Clinical Social Work Note (Signed)
CSW informed patient is to discharge to return [facility] today. [name] is aware and discharge information has been sent to them. Patient's son is aware of discharge according to patient's nurse today. Patient to transport via EMS. York SpanielMonica Ahlia Lemanski MSW,LCSW [phone]

## 2017-09-24 NOTE — Discharge Instructions (Signed)
Aspiration and fall precaution. °

## 2017-09-24 NOTE — Progress Notes (Signed)
ANTICOAGULATION CONSULT NOTE - Initial Consult  Pharmacy Consult for heparin gtt Indication: DVT  Allergies  Allergen Reactions  . Hydroxyzine Hcl Other (See Comments)    Makes patient feel like she does not want to live    Patient Measurements: Height: 5\' 4"  (162.6 cm) Weight: 140 lb (63.5 kg) IBW/kg (Calculated) : 54.7 Heparin Dosing Weight: 63.5kg  Vital Signs: Temp: 97.7 F (36.5 C) (06/21 0502) Temp Source: Oral (06/21 0502) BP: 177/93 (06/21 0502) Pulse Rate: 84 (06/21 0502)  Labs: Recent Labs    09/21/17 1620 09/22/17 0444  09/23/17 0452 09/23/17 2030 09/24/17 0500  HGB 11.5* 11.0*  --   --   --   --   HCT 33.7* 32.3*  --   --   --   --   PLT 298 283  --   --   --   --   APTT 39* 79*   < > 103* 99* 93*  LABPROT 14.9  --   --   --   --   --   INR 1.18  --   --   --   --   --   HEPARINUNFRC  --  1.36*   < > 0.99* 0.71* 0.63  CREATININE 1.03* 0.64  --  0.67  --  0.52   < > = values in this interval not displayed.    Estimated Creatinine Clearance: 43.6 mL/min (by C-G formula based on SCr of 0.52 mg/dL).   Medical History: Past Medical History:  Diagnosis Date  . ANXIETY 11/21/2008  . Atrial fibrillation (HCC)   . CAD (coronary artery disease)    Mild by cath in remote past  . CHF (congestive heart failure) (HCC)   . COPD 01/06/2007  . DIVERTICULITIS OF COLON 07/23/2009  . Fistula   . GERD 01/06/2007  . HYPERLIPIDEMIA 01/06/2007  . HYPERTENSION 01/06/2007  . PAD (peripheral artery disease) (HCC)   . SLEEP APNEA 11/21/2008    Medications:  Medications Prior to Admission  Medication Sig Dispense Refill Last Dose  . apixaban (ELIQUIS) 2.5 MG TABS tablet Take 2.5 mg by mouth 2 (two) times daily.   09/21/2017 at GAB  . colchicine 0.6 MG tablet Take 0.3 mg by mouth daily.   09/21/2017 at Unknown time  . diltiazem (CARDIZEM CD) 240 MG 24 hr capsule Take 240 mg by mouth daily.   09/21/2017 at Unknown time  . furosemide (LASIX) 20 MG tablet Take 20 mg by mouth  daily as needed for edema.   09/21/2017 at PRN  . gabapentin (NEURONTIN) 100 MG capsule Take 100 mg by mouth 2 (two) times daily.    09/21/2017 at Unknown time  . lisinopril (PRINIVIL,ZESTRIL) 20 MG tablet Take 20 mg by mouth daily.   09/21/2017 at Unknown time  . magnesium oxide (MAG-OX) 400 MG tablet Take 400 mg by mouth daily.   09/21/2017 at Unknown time  . Melatonin 3 MG TABS Take 3 mg by mouth at bedtime.   09/20/2017 at Unknown time  . nystatin (MYCOSTATIN/NYSTOP) powder Apply 1 application topically 2 (two) times daily.   09/21/2017 at Unknown time  . omeprazole (PRILOSEC) 40 MG capsule Take 40 mg by mouth daily with breakfast.    09/21/2017 at Unknown time  . sennosides-docusate sodium (SENOKOT-S) 8.6-50 MG tablet Take 2 tablets by mouth daily.   09/21/2017 at Unknown time  . traMADol (ULTRAM) 50 MG tablet Take 50 mg by mouth every 8 (eight) hours as needed.   PRN at PRN  .  traZODone (DESYREL) 50 MG tablet Take 50 mg by mouth at bedtime.   09/20/2017 at Unknown time  . vitamin B-12 (CYANOCOBALAMIN) 100 MCG tablet Take 1 tablet (100 mcg total) by mouth daily. 30 tablet 0 09/21/2017 at Unknown time  . Vitamin D, Ergocalciferol, (DRISDOL) 50000 UNITS CAPS Take 50,000 Units by mouth every 30 (thirty) days.   Past Month at Unknown time  . acetaminophen (TYLENOL) 325 MG tablet Take 2 tablets (650 mg total) by mouth every 6 (six) hours as needed for mild pain (headache). (Patient not taking: Reported on 04/06/2017) 30 tablet 0 PRN at PRN  . albuterol (ACCUNEB) 1.25 MG/3ML nebulizer solution Take 3 mLs by nebulization once as needed for wheezing.   PRN at PRN  . albuterol (PROVENTIL) (2.5 MG/3ML) 0.083% nebulizer solution Take 2.5 mg by nebulization every 6 (six) hours as needed for wheezing or shortness of breath.   PRN at PRN  . carboxymethylcellulose (REFRESH PLUS) 0.5 % SOLN Place 1 drop into both eyes as needed.   PRN at PRN  . cephALEXin (KEFLEX) 250 MG/5ML suspension Take 10 mLs (500 mg total) by mouth  every 12 (twelve) hours. For 3 days. (Patient not taking: Reported on 09/21/2017) 60 mL 0 Not Taking at Unknown time  . DIAPER RASH PRODUCTS EX Apply 1 application topically 3 (three) times daily as needed.   PRN at PRN  . loperamide (IMODIUM) 2 MG capsule Take 2-4 mg by mouth. Take 2 capsules (4mg ) by mouth at first sign of loose stool then take 1 capsule (2mg ) after each additional loose stool (max 2 doses daily)   PRN at PRN  . magnesium hydroxide (MILK OF MAGNESIA) 800 MG/5ML suspension Take 30 mLs by mouth 2 (two) times daily as needed for constipation.   PRN at PRN  . ondansetron (ZOFRAN) 4 MG tablet Take 1 tablet (4 mg total) by mouth every 6 (six) hours. As needed for nausea or vomiting (Patient taking differently: Take 4 mg by mouth every 8 (eight) hours as needed for nausea or vomiting. ) 12 tablet 0 PRN at PRN   Scheduled:  . colchicine  0.3 mg Oral Daily  . diltiazem  240 mg Oral Daily  . gabapentin  100 mg Oral BID  . lisinopril  20 mg Oral Daily  . magnesium oxide  400 mg Oral Daily  . Melatonin  5 mg Oral QHS  . nystatin   Topical BID  . pantoprazole  40 mg Oral Daily  . senna-docusate  2 tablet Oral Daily  . traZODone  50 mg Oral QHS  . vitamin B-12  100 mcg Oral Daily  . Vitamin D (Ergocalciferol)  50,000 Units Oral Q30 days   Infusions:  . sodium chloride 75 mL/hr at 09/24/17 0426  . heparin 1,100 Units/hr (09/23/17 1715)   PRN:  Anti-infectives (From admission, onward)   Start     Dose/Rate Route Frequency Ordered Stop   09/23/17 0600  ceFAZolin (ANCEF) IVPB 2g/100 mL premix     2 g 200 mL/hr over 30 Minutes Intravenous On call to O.R. 09/22/17 1856 09/23/17 1329      Assessment: 84 year old female with femoral artery occlusion but anticoagulated prior to admission on apixaban. Will start heparin gtt per pharmacy consult with reduced bolus   Goal of Therapy:  Heparin level 0.3-0.7 units/ml aPTT 66-102 seconds Monitor platelets by anticoagulation protocol:  Yes   Plan:  06/20 @ 2030 HL 0.71, aPTT 79. APTT therapeutic. Will continue current rate of  1100 units/hr and will recheck HL/aPTT w/ am labs. CBC stable w/ f/u w/ am labs.  06/21 @ 0500 HL 0.63, aPTT 93 seconds. Both levels therapeutic and correlating. Will continue current rate and will recheck HL w/ am labs.  Thomasene Ripple, PharmD, BCPS Clinical Pharmacist 09/24/2017

## 2017-09-24 NOTE — Progress Notes (Signed)
Pt's vitals were WDL and her IV removed. She bled a little extra so I held pressure on it until no more blood was seen. The pt was released to EMS. I sent her DNR form with EMS and they stated they would get the nurse Lana to sign for her since she is not capable of signing for herself.

## 2017-09-24 NOTE — Progress Notes (Signed)
ANTICOAGULATION CONSULT NOTE  Pharmacy Consult for Eliquis Indication: atrial fibrillation  Allergies  Allergen Reactions  . Hydroxyzine Hcl Other (See Comments)    Makes patient feel like she does not want to live    Patient Measurements: Height: 5\' 4"  (162.6 cm) Weight: 140 lb (63.5 kg) IBW/kg (Calculated) : 54.7  Vital Signs: Temp: 97.9 F (36.6 C) (06/21 1018) Temp Source: Oral (06/21 1018) BP: 154/75 (06/21 1018) Pulse Rate: 76 (06/21 1018)  Labs: Recent Labs    09/21/17 1620 09/22/17 0444  09/23/17 0452 09/23/17 2030 09/24/17 0500  HGB 11.5* 11.0*  --   --   --   --   HCT 33.7* 32.3*  --   --   --   --   PLT 298 283  --   --   --   --   APTT 39* 79*   < > 103* 99* 93*  LABPROT 14.9  --   --   --   --   --   INR 1.18  --   --   --   --   --   HEPARINUNFRC  --  1.36*   < > 0.99* 0.71* 0.63  CREATININE 1.03* 0.64  --  0.67  --  0.52   < > = values in this interval not displayed.    Estimated Creatinine Clearance: 43.6 mL/min (by C-G formula based on SCr of 0.52 mg/dL).   Medical History: Past Medical History:  Diagnosis Date  . ANXIETY 11/21/2008  . Atrial fibrillation (HCC)   . CAD (coronary artery disease)    Mild by cath in remote past  . CHF (congestive heart failure) (HCC)   . COPD 01/06/2007  . DIVERTICULITIS OF COLON 07/23/2009  . Fistula   . GERD 01/06/2007  . HYPERLIPIDEMIA 01/06/2007  . HYPERTENSION 01/06/2007  . PAD (peripheral artery disease) (HCC)   . SLEEP APNEA 11/21/2008    Medications:  Scheduled:  . apixaban  5 mg Oral BID  . colchicine  0.3 mg Oral Daily  . diltiazem  240 mg Oral Daily  . gabapentin  100 mg Oral BID  . lisinopril  20 mg Oral Daily  . magnesium oxide  400 mg Oral Daily  . Melatonin  5 mg Oral QHS  . nystatin   Topical BID  . pantoprazole  40 mg Oral Daily  . senna-docusate  2 tablet Oral Daily  . traZODone  50 mg Oral QHS  . vitamin B-12  100 mcg Oral Daily  . Vitamin D (Ergocalciferol)  50,000 Units Oral Q30  days    Assessment: 84 year old female admitted with suspected DVT on Eliquis 2.5mg  BID PTA. She was started on a heparin drip for the suspected DVT. An angiogram performed 6/20 by vascular surgery showed PAD and not DVT. In spite of the fact that she did not have a confirmed DVT she only meets one of the 3 dose reduction criteria. Therefore the dose should be 5mg  BID. Heparin drip was just stopped and time to administration of first dose will be approximately one hour from discontinuation.  Goal of Therapy:  Monitor platelets by anticoagulation protocol: Yes   Plan:  Begin apixiban 5mg  BID. I will speak to Mrs Atha StarksWelborn about the change in dose.   Lowella Bandyodney D Iona Stay , PharnD 09/24/2017,11:17 AM

## 2017-09-24 NOTE — Progress Notes (Signed)
ANTICOAGULATION CONSULT NOTE - Initial Consult  Pharmacy Consult for heparin gtt Indication: DVT  Allergies  Allergen Reactions  . Hydroxyzine Hcl Other (See Comments)    Makes patient feel like she does not want to live    Patient Measurements: Height: 5\' 4"  (162.6 cm) Weight: 140 lb (63.5 kg) IBW/kg (Calculated) : 54.7 Heparin Dosing Weight: 63.5kg  Vital Signs: Temp: 98.4 F (36.9 C) (06/20 1951) Temp Source: Oral (06/20 1951) BP: 134/56 (06/20 1951) Pulse Rate: 83 (06/20 1951)  Labs: Recent Labs    09/21/17 1620  09/22/17 0444 09/22/17 1325 09/23/17 0452 09/23/17 2030  HGB 11.5*  --  11.0*  --   --   --   HCT 33.7*  --  32.3*  --   --   --   PLT 298  --  283  --   --   --   APTT 39*  --  79* 64* 103* 99*  LABPROT 14.9  --   --   --   --   --   INR 1.18  --   --   --   --   --   HEPARINUNFRC  --    < > 1.36* 1.01* 0.99* 0.71*  CREATININE 1.03*  --  0.64  --  0.67  --    < > = values in this interval not displayed.    Estimated Creatinine Clearance: 43.6 mL/min (by C-G formula based on SCr of 0.67 mg/dL).   Medical History: Past Medical History:  Diagnosis Date  . ANXIETY 11/21/2008  . Atrial fibrillation (HCC)   . CAD (coronary artery disease)    Mild by cath in remote past  . CHF (congestive heart failure) (HCC)   . COPD 01/06/2007  . DIVERTICULITIS OF COLON 07/23/2009  . Fistula   . GERD 01/06/2007  . HYPERLIPIDEMIA 01/06/2007  . HYPERTENSION 01/06/2007  . PAD (peripheral artery disease) (HCC)   . SLEEP APNEA 11/21/2008    Medications:  Medications Prior to Admission  Medication Sig Dispense Refill Last Dose  . apixaban (ELIQUIS) 2.5 MG TABS tablet Take 2.5 mg by mouth 2 (two) times daily.   09/21/2017 at GAB  . colchicine 0.6 MG tablet Take 0.3 mg by mouth daily.   09/21/2017 at Unknown time  . diltiazem (CARDIZEM CD) 240 MG 24 hr capsule Take 240 mg by mouth daily.   09/21/2017 at Unknown time  . furosemide (LASIX) 20 MG tablet Take 20 mg by mouth  daily as needed for edema.   09/21/2017 at PRN  . gabapentin (NEURONTIN) 100 MG capsule Take 100 mg by mouth 2 (two) times daily.    09/21/2017 at Unknown time  . lisinopril (PRINIVIL,ZESTRIL) 20 MG tablet Take 20 mg by mouth daily.   09/21/2017 at Unknown time  . magnesium oxide (MAG-OX) 400 MG tablet Take 400 mg by mouth daily.   09/21/2017 at Unknown time  . Melatonin 3 MG TABS Take 3 mg by mouth at bedtime.   09/20/2017 at Unknown time  . nystatin (MYCOSTATIN/NYSTOP) powder Apply 1 application topically 2 (two) times daily.   09/21/2017 at Unknown time  . omeprazole (PRILOSEC) 40 MG capsule Take 40 mg by mouth daily with breakfast.    09/21/2017 at Unknown time  . sennosides-docusate sodium (SENOKOT-S) 8.6-50 MG tablet Take 2 tablets by mouth daily.   09/21/2017 at Unknown time  . traMADol (ULTRAM) 50 MG tablet Take 50 mg by mouth every 8 (eight) hours as needed.   PRN at  PRN  . traZODone (DESYREL) 50 MG tablet Take 50 mg by mouth at bedtime.   09/20/2017 at Unknown time  . vitamin B-12 (CYANOCOBALAMIN) 100 MCG tablet Take 1 tablet (100 mcg total) by mouth daily. 30 tablet 0 09/21/2017 at Unknown time  . Vitamin D, Ergocalciferol, (DRISDOL) 50000 UNITS CAPS Take 50,000 Units by mouth every 30 (thirty) days.   Past Month at Unknown time  . acetaminophen (TYLENOL) 325 MG tablet Take 2 tablets (650 mg total) by mouth every 6 (six) hours as needed for mild pain (headache). (Patient not taking: Reported on 04/06/2017) 30 tablet 0 PRN at PRN  . albuterol (ACCUNEB) 1.25 MG/3ML nebulizer solution Take 3 mLs by nebulization once as needed for wheezing.   PRN at PRN  . albuterol (PROVENTIL) (2.5 MG/3ML) 0.083% nebulizer solution Take 2.5 mg by nebulization every 6 (six) hours as needed for wheezing or shortness of breath.   PRN at PRN  . carboxymethylcellulose (REFRESH PLUS) 0.5 % SOLN Place 1 drop into both eyes as needed.   PRN at PRN  . cephALEXin (KEFLEX) 250 MG/5ML suspension Take 10 mLs (500 mg total) by mouth  every 12 (twelve) hours. For 3 days. (Patient not taking: Reported on 09/21/2017) 60 mL 0 Not Taking at Unknown time  . DIAPER RASH PRODUCTS EX Apply 1 application topically 3 (three) times daily as needed.   PRN at PRN  . loperamide (IMODIUM) 2 MG capsule Take 2-4 mg by mouth. Take 2 capsules (4mg ) by mouth at first sign of loose stool then take 1 capsule (2mg ) after each additional loose stool (max 2 doses daily)   PRN at PRN  . magnesium hydroxide (MILK OF MAGNESIA) 800 MG/5ML suspension Take 30 mLs by mouth 2 (two) times daily as needed for constipation.   PRN at PRN  . ondansetron (ZOFRAN) 4 MG tablet Take 1 tablet (4 mg total) by mouth every 6 (six) hours. As needed for nausea or vomiting (Patient taking differently: Take 4 mg by mouth every 8 (eight) hours as needed for nausea or vomiting. ) 12 tablet 0 PRN at PRN   Scheduled:  . colchicine  0.3 mg Oral Daily  . diltiazem  240 mg Oral Daily  . gabapentin  100 mg Oral BID  . lisinopril  20 mg Oral Daily  . magnesium oxide  400 mg Oral Daily  . Melatonin  5 mg Oral QHS  . nystatin   Topical BID  . pantoprazole  40 mg Oral Daily  . senna-docusate  2 tablet Oral Daily  . traZODone  50 mg Oral QHS  . vitamin B-12  100 mcg Oral Daily  . Vitamin D (Ergocalciferol)  50,000 Units Oral Q30 days   Infusions:  . sodium chloride 75 mL/hr at 09/23/17 1433  . heparin 1,100 Units/hr (09/23/17 1715)   PRN:  Anti-infectives (From admission, onward)   Start     Dose/Rate Route Frequency Ordered Stop   09/23/17 0600  ceFAZolin (ANCEF) IVPB 2g/100 mL premix     2 g 200 mL/hr over 30 Minutes Intravenous On call to O.R. 09/22/17 1856 09/23/17 1329      Assessment: 84 year old female with femoral artery occlusion but anticoagulated prior to admission on apixaban. Will start heparin gtt per pharmacy consult with reduced bolus   Goal of Therapy:  Heparin level 0.3-0.7 units/ml aPTT 66-102 seconds Monitor platelets by anticoagulation protocol:  Yes   Plan:  06/20 @ 2030 HL 0.71, aPTT 79. APTT therapeutic. Will continue  current rate of 1100 units/hr and will recheck HL/aPTT w/ am labs. CBC stable w/ f/u w/ am labs.  Thomasene Ripple, PharmD, BCPS Clinical Pharmacist 09/24/2017

## 2017-09-24 NOTE — Discharge Summary (Addendum)
[facility] [place] [facility]   PATIENT NAME: [name] Cunningham    MR#:  [id number]  DATE OF BIRTH:  05/05/1928  DATE OF ADMISSION:  09/21/2017   ADMITTING PHYSICIAN: Ramonita LabAruna Gouru, MD  DATE OF DISCHARGE: 09/24/2017 PRIMARY CARE PHYSICIAN: Karie SchwalbeLetvak, Richard I, MD   ADMISSION DIAGNOSIS:  Arterial occlusion [I70.90] DISCHARGE DIAGNOSIS:  Active Problems:   Arterial occlusion  SECONDARY DIAGNOSIS:   Past Medical History:  Diagnosis Date  . ANXIETY 11/21/2008  . Atrial fibrillation (HCC)   . CAD (coronary artery disease)    Mild by cath in remote past  . CHF (congestive heart failure) (HCC)   . COPD 01/06/2007  . DIVERTICULITIS OF COLON 07/23/2009  . Fistula   . GERD 01/06/2007  . HYPERLIPIDEMIA 01/06/2007  . HYPERTENSION 01/06/2007  . PAD (peripheral artery disease) (HCC)   . SLEEP APNEA 11/21/2008   HOSPITAL COURSE:    [name] ErmMargaret Grismer  is a 84 y.o. female with a known history of chronic atrial fibrillation on Eliquis, chronic congestive heart failure, COPD, hyperlipidemia, peripheral arterial disease and multiple other medical problems is brought into the ED from [facility] for right leg pain and diminished pulses.  During my examination in the emergency department patient toes are purplish and Doppler showed biphasic waves.  As reported by the ED physician arterial ultrasound has revealed complete femoral artery occlusion.  ED physician has discussed with the on-call vascular surgery who has recommended heparin drip and angiogram.   #Right leg ischemia from complete right femoral artery occlusion with history of peripheral arterial disease Admit to MedSurg unit Heparin drip was started discontinued Eliquis S/p angioplasty with stent placement.   The patient has no complaints of her right leg pain.  Circulation in right leg is much better. Still on heparin drip after procedure. Per Dr. Gilda CreaseSchnier, discontinue heparin drip, resume Eliquis and discharge  patient back to skilled nursing facility today.   #COPD no exacerbation -Albuterol nebulizer treatments as needed  #Chronic dementia-patient could provide some history Not end-stage dementia, continue close monitoring   #Chronic atrial fibrillation-rate controlled Eliquis is on hold as patient is on heparin drip.    Resume Eliquis and continue Cardizem  #Essential hypertension continue home medication lisinopril-Cardizem CD  #Diabetes mellitus. Controlled.  Continue Neurontin for diabetic neuropathy.  #Tobacco abuse disorder -reports she did quit smoking  Hyponatremia.  Improved with normal saline IV. Hypokalemia and hypomagnesemia.  Given potassium and magnesium supplement.  Improved. I discussed with Dr. Gilda CreaseSchnier. DISCHARGE CONDITIONS:  Stable, discharge to skilled nursing facility today. CONSULTS OBTAINED:  Treatment Team:  Renford DillsSchnier, Gregory G, MD DRUG ALLERGIES:   Allergies  Allergen Reactions  . Hydroxyzine Hcl Other (See Comments)    Makes patient feel like she does not want to live   DISCHARGE MEDICATIONS:   Allergies as of 09/24/2017      Reactions   Hydroxyzine Hcl Other (See Comments)   Makes patient feel like she does not want to live      Medication List    STOP taking these medications   cephALEXin 250 MG/5ML suspension Commonly known as:  KEFLEX     TAKE these medications   acetaminophen 325 MG tablet Commonly known as:  TYLENOL Take 2 tablets (650 mg total) by mouth every 6 (six) hours as needed for mild pain (headache).   albuterol (2.5 MG/3ML) 0.083% nebulizer solution Commonly known as:  PROVENTIL Take 2.5 mg by nebulization every 6 (six) hours as needed for wheezing or  shortness of breath.   albuterol 1.25 MG/3ML nebulizer solution Commonly known as:  ACCUNEB Take 3 mLs by nebulization once as needed for wheezing.   apixaban 5 MG Tabs tablet Commonly known as:  ELIQUIS Take 1 tablet (5 mg total) by mouth 2 (two) times daily. What  changed:    medication strength  how much to take   carboxymethylcellulose 0.5 % Soln Commonly known as:  REFRESH PLUS Place 1 drop into both eyes as needed.   colchicine 0.6 MG tablet Take 0.3 mg by mouth daily.   DIAPER RASH PRODUCTS EX Apply 1 application topically 3 (three) times daily as needed.   diltiazem 240 MG 24 hr capsule Commonly known as:  CARDIZEM CD Take 240 mg by mouth daily.   furosemide 20 MG tablet Commonly known as:  LASIX Take 20 mg by mouth daily as needed for edema.   gabapentin 100 MG capsule Commonly known as:  NEURONTIN Take 100 mg by mouth 2 (two) times daily.   lisinopril 20 MG tablet Commonly known as:  PRINIVIL,ZESTRIL Take 20 mg by mouth daily.   loperamide 2 MG capsule Commonly known as:  IMODIUM Take 2-4 mg by mouth. Take 2 capsules (4mg ) by mouth at first sign of loose stool then take 1 capsule (2mg ) after each additional loose stool (max 2 doses daily)   magnesium hydroxide 800 MG/5ML suspension Commonly known as:  MILK OF MAGNESIA Take 30 mLs by mouth 2 (two) times daily as needed for constipation.   magnesium oxide 400 MG tablet Commonly known as:  MAG-OX Take 400 mg by mouth daily.   Melatonin 5 MG Tabs Take 1 tablet (5 mg total) by mouth at bedtime. What changed:    medication strength  how much to take   nystatin powder Commonly known as:  MYCOSTATIN/NYSTOP Apply 1 application topically 2 (two) times daily.   omeprazole 40 MG capsule Commonly known as:  PRILOSEC Take 40 mg by mouth daily with breakfast.   ondansetron 4 MG tablet Commonly known as:  ZOFRAN Take 1 tablet (4 mg total) by mouth every 6 (six) hours. As needed for nausea or vomiting What changed:    when to take this  reasons to take this  additional instructions   sennosides-docusate sodium 8.6-50 MG tablet Commonly known as:  SENOKOT-S Take 2 tablets by mouth daily.   traMADol 50 MG tablet Commonly known as:  ULTRAM Take 50 mg by mouth  every 8 (eight) hours as needed.   traZODone 50 MG tablet Commonly known as:  DESYREL Take 50 mg by mouth at bedtime.   vitamin B-12 100 MCG tablet Commonly known as:  CYANOCOBALAMIN Take 1 tablet (100 mcg total) by mouth daily.   Vitamin D (Ergocalciferol) 50000 units Caps capsule Commonly known as:  DRISDOL Take 50,000 Units by mouth every 30 (thirty) days.        DISCHARGE INSTRUCTIONS:  See AVS.  If you experience worsening of your admission symptoms, develop shortness of breath, life threatening emergency, suicidal or homicidal thoughts you must seek medical attention immediately by calling 911 or calling your MD immediately  if symptoms less severe.  You Must read complete instructions/literature along with all the possible adverse reactions/side effects for all the Medicines you take and that have been prescribed to you. Take any new Medicines after you have completely understood and accpet all the possible adverse reactions/side effects.   Please note  You were cared for by a hospitalist during your hospital stay. If  you have any questions about your discharge medications or the care you received while you were in the hospital after you are discharged, you can call the unit and asked to speak with the hospitalist on call if the hospitalist that took care of you is not available. Once you are discharged, your primary care physician will handle any further medical issues. Please note that NO REFILLS for any discharge medications will be authorized once you are discharged, as it is imperative that you return to your primary care physician (or establish a relationship with a primary care physician if you do not have one) for your aftercare needs so that they can reassess your need for medications and monitor your lab values.    On the day of Discharge:  VITAL SIGNS:  Blood pressure (!) 154/75, pulse 76, temperature 97.9 F (36.6 C), temperature source Oral, resp. rate 16, height  5\' 4"  (1.626 m), weight 140 lb (63.5 kg), SpO2 96 %. PHYSICAL EXAMINATION:  GENERAL:  84 y.o.-year-old patient lying in the bed with no acute distress.  EYES: Pupils equal, round, reactive to light and accommodation. No scleral icterus. Extraocular muscles intact.  HEENT: Head atraumatic, normocephalic. Oropharynx and nasopharynx clear.  NECK:  Supple, no jugular venous distention. No thyroid enlargement, no tenderness.  LUNGS: Normal breath sounds bilaterally, no wheezing, rales,rhonchi or crepitation. No use of accessory muscles of respiration.  CARDIOVASCULAR: S1, S2 normal. No murmurs, rubs, or gallops.  ABDOMEN: Soft, non-tender, non-distended. Bowel sounds present. No organomegaly or mass.  EXTREMITIES: No pedal edema, cyanosis, or clubbing.  NEUROLOGIC: Cranial nerves II through XII are intact. Muscle strength 4/5 in all extremities. Sensation intact. Gait not checked.  PSYCHIATRIC: The patient is demented.  SKIN: No obvious rash, lesion, or ulcer.  DATA REVIEW:   CBC Recent Labs  Lab 09/22/17 0444  WBC 8.7  HGB 11.0*  HCT 32.3*  PLT 283    Chemistries  Recent Labs  Lab 09/21/17 1620  09/24/17 0500  NA 130*   < > 134*  K 3.9   < > 3.4*  CL 92*   < > 101  CO2 25   < > 22  GLUCOSE 132*   < > 120*  BUN 31*   < > 10  CREATININE 1.03*   < > 0.52  CALCIUM 9.2   < > 8.9  MG  --    < > 2.0  AST 40  --   --   ALT 41  --   --   ALKPHOS 73  --   --   BILITOT 0.4  --   --    < > = values in this interval not displayed.     Microbiology Results  Results for orders placed or performed during the hospital encounter of 09/21/17  MRSA PCR Screening     Status: None   Collection Time: 09/21/17 10:00 PM  Result Value Ref Range Status   MRSA by PCR NEGATIVE NEGATIVE Final    Comment:        The GeneXpert MRSA Assay (FDA approved for NASAL specimens only), is one component of a comprehensive MRSA colonization surveillance program. It is not intended to diagnose  MRSA infection nor to guide or monitor treatment for MRSA infections. Performed at [facility], [address] [place], [place] [address]     RADIOLOGY:  No results found.   Management plans discussed with the patient, her son and they are in agreement.  CODE STATUS: DNR  TOTAL TIME TAKING CARE OF THIS PATIENT: 38 minutes.    Shaune Pollack M.D on 09/24/2017 at 11:10 AM  Between 7am to 6pm - Pager - [phone]  After 6pm go to [email or web address] - Social research officer, government  [facility]  Office  [phone]  CC: Primary care physician; Karie Schwalbe, MD   Note: This dictation was prepared with Dragon dictation along with smaller phrase technology. Any transcriptional errors that result from this process are unintentional.

## 2017-09-24 NOTE — Care Management Important Message (Signed)
Copy of signed IM left with patient in room.  

## 2017-09-24 NOTE — Op Note (Signed)
[facility] Percutaneous Study/Intervention Procedural Note   Date of Surgery: 09/24/2017  Surgeon:  Katha Cabal, MD.  Pre-operative Diagnosis: Atherosclerotic occlusive disease bilateral lower extremities with rest pain and ischemic changes of the right leg  Post-operative diagnosis: Same  Procedure(s) Performed: 1. Introduction catheter into right lower extremity 3rd order catheter placement  2. Contrast injection right lower extremity for distal runoff   3. Percutaneous transluminal angioplasty right superficial femoral artery to 5 mm with a Lutonix drug-eluting balloon  4. Star close closure left common femoral arteriotomy  Anesthesia: Conscious sedation was administered under my direct supervision by the interventional radiology RN. IV Versed plus fentanyl were utilized. Continuous ECG, pulse oximetry and blood pressure was monitored throughout the entire procedure.  Conscious sedation was for a total of 48 minutes.  Sheath: 6 [place] Ansell left common femoral  Contrast: 70 cc  Fluoroscopy Time: 14.2 minutes  Indications: [name] Cunningham presents with rest pain and ischemic changes of the right foot.  The risks and benefits are reviewed all questions answered patient agrees to proceed.  Procedure: [name] Cunningham is a 84 y.o. y.o. female who was identified and appropriate procedural time out was performed. The patient was then placed supine on the table and prepped and draped in the usual sterile fashion.   Ultrasound was placed in the sterile sleeve and the left groin was evaluated the left common femoral artery was echolucent and pulsatile indicating patency.  Image was recorded for the permanent record and under real-time visualization a microneedle was inserted into the common femoral artery microwire followed by a micro-sheath.  A J-wire was then advanced through the micro-sheath and  a  5 [place] sheath was then inserted over a J-wire. J-wire was then advanced and a 5 French pigtail catheter was positioned at the level of T12. AP projection of the aorta was then obtained. Pigtail catheter was repositioned to above the bifurcation and a LAO view of the pelvis was obtained.  Subsequently a rim catheter with the stiff angle Glidewire was used to cross the aortic bifurcation the catheter wire were advanced down into the right distal external iliac artery. Oblique view of the femoral bifurcation was then obtained and subsequently the wire was reintroduced and the pigtail catheter negotiated into the SFA representing third order catheter placement. Distal runoff was then performed.  5000 units of heparin was then given and allowed to circulate and a 6 [place] Ansell sheath was advanced up and over the bifurcation and positioned in the femoral artery  KMP  catheter and stiff angle Glidewire were then negotiated down into the distal popliteal.  Distal runoff was then completed by hand injection through the catheter. The wire was then reintroduced and a 4 mm Ultraverse balloon was used to angioplasty the superficial femoral and popliteal arteries. Inflations were to 12 atmospheres for 2 minutes.  Follow-up imaging demonstrated significant residual stenosis of approximately 50% and therefore a 5 mm Lutonix drug-eluting balloons were used.  Inflations were to 12 atm for 2 minutes.  Follow-up imaging demonstrated patency with less than 10% residual stenosis. Distal runoff was then reassessed and found to be unchanged.  After review of these images the sheath is pulled into the left external iliac oblique of the common femoral is obtained and a Star close device deployed. There no immediate complications.   Findings: The abdominal aorta is opacified with a bolus injection contrast. Renal arteries are single and patent. The aorta itself has diffuse disease  but no hemodynamically significant lesions.  The common and external iliac arteries are widely patent bilaterally.  The right common femoral is widely patent as is the profunda femoris.  The SFA does indeed have a long segment occlusion which is flush at the origin and reconstitutes the above-knee popliteal at Hunter's canal.  The distal popliteal demonstrates diffuse disease and the trifurcation is heavily diseased with occlusion of the anterior tibial and posterior tibial arteries.  The peroneal is patent and fills the distal posterior tibial via collaterals which then fills the pedal arch.  Following angioplasty right the SFA is now patent with in-line flow and looks quite nice with less than 10% residual stenosis.     Summary: Successful recanalization right lower extremity for limb salvage    Disposition: Patient was taken to the recovery room in stable condition having tolerated the procedure well.  Yeray Cunningham, [name] Cunningham 09/24/2017,11:01 AM

## 2017-09-27 DIAGNOSIS — I739 Peripheral vascular disease, unspecified: Secondary | ICD-10-CM | POA: Diagnosis not present

## 2017-10-01 DIAGNOSIS — B351 Tinea unguium: Secondary | ICD-10-CM

## 2017-10-04 ENCOUNTER — Ambulatory Visit (INDEPENDENT_AMBULATORY_CARE_PROVIDER_SITE_OTHER): Payer: Medicare Other | Admitting: Vascular Surgery

## 2017-10-04 ENCOUNTER — Encounter (INDEPENDENT_AMBULATORY_CARE_PROVIDER_SITE_OTHER): Payer: Self-pay | Admitting: Vascular Surgery

## 2017-10-04 VITALS — BP 108/68 | HR 74 | Resp 16 | Ht 64.0 in

## 2017-10-04 DIAGNOSIS — I4891 Unspecified atrial fibrillation: Secondary | ICD-10-CM

## 2017-10-04 DIAGNOSIS — I10 Essential (primary) hypertension: Secondary | ICD-10-CM | POA: Diagnosis not present

## 2017-10-04 DIAGNOSIS — I7090 Unspecified atherosclerosis: Secondary | ICD-10-CM

## 2017-10-04 DIAGNOSIS — I2510 Atherosclerotic heart disease of native coronary artery without angina pectoris: Secondary | ICD-10-CM

## 2017-10-04 DIAGNOSIS — I739 Peripheral vascular disease, unspecified: Secondary | ICD-10-CM | POA: Diagnosis not present

## 2017-10-04 DIAGNOSIS — K219 Gastro-esophageal reflux disease without esophagitis: Secondary | ICD-10-CM | POA: Diagnosis not present

## 2017-10-04 NOTE — Progress Notes (Signed)
MRN : [id number]  [name] Cunningham is a 84 y.o. (03/25/1929) female who presents with chief complaint of  Chief Complaint  Patient presents with  . Follow-up    [facility] 1week   .  History of Present Illness:   The patient returns to the office for followup and review status post angiogram with intervention. The patient notes improvement in the lower extremity symptoms. No interval shortening of the patient's claudication distance or rest pain symptoms. Previous wounds have now healed.  No new ulcers or wounds have occurred since the last visit.  There have been no significant changes to the patient's overall health care.  The patient denies amaurosis fugax or recent TIA symptoms. There are no recent neurological changes noted. The patient denies history of DVT, PE or superficial thrombophlebitis. The patient denies recent episodes of angina or shortness of breath.   Procedure(s) Performed 09/24/2017: 1. Introduction catheter into right lower extremity 3rd order catheter placement  2. Contrast injection right lower extremity for distal runoff   3. Percutaneous transluminal angioplasty right superficial femoral artery to 5 mm with a Lutonix drug-eluting balloon  4. Star close closure left common femoral arteriotomy    Current Meds  Medication Sig  . acetaminophen (TYLENOL) 325 MG tablet Take 2 tablets (650 mg total) by mouth every 6 (six) hours as needed for mild pain (headache).  Marland Kitchen albuterol (ACCUNEB) 1.25 MG/3ML nebulizer solution Take 3 mLs by nebulization once as needed for wheezing.  Marland Kitchen albuterol (PROVENTIL) (2.5 MG/3ML) 0.083% nebulizer solution Take 2.5 mg by nebulization every 6 (six) hours as needed for wheezing or shortness of breath.  Marland Kitchen apixaban (ELIQUIS) 5 MG TABS tablet Take 1 tablet (5 mg total) by mouth 2 (two) times daily.  . carboxymethylcellulose (REFRESH PLUS) 0.5 % SOLN Place 1 drop into both eyes as needed.  .  colchicine 0.6 MG tablet Take 0.3 mg by mouth daily.  Marland Kitchen DIAPER RASH PRODUCTS EX Apply 1 application topically 3 (three) times daily as needed.  . diltiazem (CARDIZEM CD) 240 MG 24 hr capsule Take 240 mg by mouth daily.  . furosemide (LASIX) 20 MG tablet Take 20 mg by mouth daily as needed for edema.  . gabapentin (NEURONTIN) 100 MG capsule Take 100 mg by mouth 2 (two) times daily.   Marland Kitchen lisinopril (PRINIVIL,ZESTRIL) 20 MG tablet Take 20 mg by mouth daily.  Marland Kitchen loperamide (IMODIUM) 2 MG capsule Take 2-4 mg by mouth. Take 2 capsules (4mg ) by mouth at first sign of loose stool then take 1 capsule (2mg ) after each additional loose stool (max 2 doses daily)  . magnesium hydroxide (MILK OF MAGNESIA) 800 MG/5ML suspension Take 30 mLs by mouth 2 (two) times daily as needed for constipation.  . magnesium oxide (MAG-OX) 400 MG tablet Take 400 mg by mouth daily.  . Melatonin 5 MG TABS Take 1 tablet (5 mg total) by mouth at bedtime.  Marland Kitchen nystatin (MYCOSTATIN/NYSTOP) powder Apply 1 application topically 2 (two) times daily.  Marland Kitchen omeprazole (PRILOSEC) 40 MG capsule Take 40 mg by mouth daily with breakfast.   . ondansetron (ZOFRAN) 4 MG tablet Take 1 tablet (4 mg total) by mouth every 6 (six) hours. As needed for nausea or vomiting (Patient taking differently: Take 4 mg by mouth every 8 (eight) hours as needed for nausea or vomiting. )  . sennosides-docusate sodium (SENOKOT-S) 8.6-50 MG tablet Take 2 tablets by mouth daily.  . traMADol (ULTRAM) 50 MG tablet Take 50 mg by mouth every 8 (  eight) hours as needed.  . traZODone (DESYREL) 50 MG tablet Take 50 mg by mouth at bedtime.  . vitamin B-12 (CYANOCOBALAMIN) 100 MCG tablet Take 1 tablet (100 mcg total) by mouth daily.  . Vitamin D, Ergocalciferol, (DRISDOL) 50000 UNITS CAPS Take 50,000 Units by mouth every 30 (thirty) days.    Past Medical History:  Diagnosis Date  . ANXIETY 11/21/2008  . Atrial fibrillation (HCC)   . CAD (coronary artery disease)    Mild by cath  in remote past  . CHF (congestive heart failure) (HCC)   . COPD 01/06/2007  . DIVERTICULITIS OF COLON 07/23/2009  . Fistula   . GERD 01/06/2007  . HYPERLIPIDEMIA 01/06/2007  . HYPERTENSION 01/06/2007  . PAD (peripheral artery disease) (HCC)   . SLEEP APNEA 11/21/2008    Past Surgical History:  Procedure Laterality Date  . ABDOMINAL HYSTERECTOMY    . APPENDECTOMY    . CATARACT EXTRACTION    . CHOLECYSTECTOMY    . colovaginal fistula repair     after partial colectomy  . GALLBLADDER SURGERY    . LOWER EXTREMITY INTERVENTION N/A 09/23/2017   Procedure: LOWER EXTREMITY INTERVENTION;  Surgeon: Renford Dills, MD;  Location: [facility] INVASIVE CV LAB;  Service: Cardiovascular;  Laterality: N/A;  . PARTIAL COLECTOMY     diverticulitis  . TEMPORAL ARTERY BIOPSY / LIGATION    . TONSILLECTOMY  84 YEARS OLD  . TUBAL LIGATION      Social History Social History   Tobacco Use  . Smoking status: Former Smoker    Types: Cigarettes  . Smokeless tobacco: Never Used  Substance Use Topics  . Alcohol use: No    Alcohol/week: 0.0 oz  . Drug use: No    Family History Family History  Problem Relation Age of Onset  . Heart disease Father   . Asthma Father   . Cancer Mother   . Diabetes Daughter   . Colon cancer Son     Allergies  Allergen Reactions  . Hydroxyzine Hcl Other (See Comments)    Makes patient feel like she does not want to live     REVIEW OF SYSTEMS (Negative unless checked)  Constitutional: [] Weight loss  [] Fever  [] Chills Cardiac: [] Chest pain   [] Chest pressure   [] Palpitations   [] Shortness of breath when laying flat   [] Shortness of breath with exertion. Vascular:  [] Pain in legs with walking   [] Pain in legs at rest  [] History of DVT   [] Phlebitis   [] Swelling in legs   [] Varicose veins   [] Non-healing ulcers Pulmonary:   [] Uses home oxygen   [] Productive cough   [] Hemoptysis   [] Wheeze  [] COPD   [] Asthma Neurologic:  [] Dizziness   [] Seizures   [] History of stroke    [] History of TIA  [] Aphasia   [] Vissual changes   [] Weakness or numbness in arm   [] Weakness or numbness in leg Musculoskeletal:   [] Joint swelling   [] Joint pain   [] Low back pain Hematologic:  [] Easy bruising  [] Easy bleeding   [] Hypercoagulable state   [] Anemic Gastrointestinal:  [] Diarrhea   [] Vomiting  [] Gastroesophageal reflux/heartburn   [] Difficulty swallowing. Genitourinary:  [] Chronic kidney disease   [] Difficult urination  [] Frequent urination   [] Blood in urine Skin:  [] Rashes   [] Ulcers  Psychological:  [] History of anxiety   []  History of major depression.  Physical Examination  Vitals:   10/04/17 1551  BP: 108/68  Pulse: 74  Resp: 16  Height: 5\' 4"  (1.626 m)  Body mass index is 24.03 kg/m. Gen: WD/WN, NAD seen in wheelchair Head: [place]/AT, No temporalis wasting.  Ear/Nose/Throat: Hearing grossly intact, nares w/o erythema or drainage Eyes: PER, EOMI, sclera nonicteric.  Neck: Supple, no large masses.   Pulmonary:  Good air movement, no audible wheezing bilaterally, no use of accessory muscles.  Cardiac: RRR, no JVD Vascular:  Vessel Right Left  Radial Palpable Palpable  PT Not Palpable Not Palpable  DP Not Palpable Not Palpable  Gastrointestinal: Non-distended. No guarding/no peritoneal signs.  Musculoskeletal: M/S 5/5 throughout.  No deformity or atrophy.  Neurologic: CN 2-12 intact. Symmetrical.  Speech is fluent. Motor exam as listed above. Psychiatric: Judgment intact, Mood & affect appropriate for pt's clinical situation. Dermatologic: No rashes or ulcers noted.  No changes consistent with cellulitis. Lymph : No lichenification or skin changes of chronic lymphedema.  CBC Lab Results  Component Value Date   WBC 8.7 09/22/2017   HGB 11.0 (L) 09/22/2017   HCT 32.3 (L) 09/22/2017   MCV 81.0 09/22/2017   PLT 283 09/22/2017    BMET    Component Value Date/Time   NA 134 (L) 09/24/2017 0500   NA 133 (L) 06/20/2012 1125   K 3.4 (L) 09/24/2017 0500   K  3.8 06/20/2012 1125   CL 101 09/24/2017 0500   CL 103 06/20/2012 1125   CO2 22 09/24/2017 0500   CO2 22 06/20/2012 1125   GLUCOSE 120 (H) 09/24/2017 0500   GLUCOSE 112 (H) 06/20/2012 1125   GLUCOSE 117 (H) 02/09/2006 1534   BUN 10 09/24/2017 0500   BUN 19 (H) 06/20/2012 1125   CREATININE 0.52 09/24/2017 0500   CREATININE 0.98 06/20/2012 1125   CREATININE 0.99 10/02/2011 1543   CALCIUM 8.9 09/24/2017 0500   CALCIUM 8.7 06/20/2012 1125   GFRNONAA >60 09/24/2017 0500   GFRNONAA 54 (L) 06/20/2012 1125   GFRAA >60 09/24/2017 0500   GFRAA >60 06/20/2012 1125   Estimated Creatinine Clearance: 43.6 mL/min (by C-G formula based on SCr of 0.52 mg/dL).  COAG Lab Results  Component Value Date   INR 1.18 09/21/2017   INR 1.48 04/06/2017   INR 1.82 (H) 01/21/2015    Radiology No results found.    Assessment/Plan 1. PAD (peripheral artery disease) (HCC) Recommend:  The patient is status post successful angiogram with intervention.  The patient reports that the claudication symptoms and leg pain is essentially gone.   The patient denies lifestyle limiting changes at this point in time.  No further invasive studies, angiography or surgery at this time The patient should continue walking and begin a more formal exercise program.  The patient should continue antiplatelet therapy and aggressive treatment of the lipid abnormalities  Smoking cessation was again discussed  The patient should continue wearing graduated compression socks 10-15 mmHg strength to control the mild edema.  Patient should undergo noninvasive studies as ordered. The patient will follow up with me after the studies.    2. Essential hypertension Continue antihypertensive medications as already ordered, these medications have been reviewed and there are no changes at this time.   3. Atherosclerosis of native coronary artery of native heart without angina pectoris Continue cardiac and antihypertensive  medications as already ordered and reviewed, no changes at this time.  Continue statin as ordered and reviewed, no changes at this time  Nitrates PRN for chest pain   4. Atrial fibrillation, unspecified type (HCC) Continue antiarrhythmia medications as already ordered, these medications have been reviewed and there are no  changes at this time.  Continue anticoagulation as ordered by Cardiology Service   5. Gastroesophageal reflux disease, esophagitis presence not specified Continue antihypertensive medications as already ordered, these medications have been reviewed and there are no changes at this time.  Avoidence of caffeine and alcohol  Moderate elevation of the head of the bed    Levora DredgeGregory Schnier, MD  10/04/2017 4:14 PM

## 2017-10-16 ENCOUNTER — Encounter (INDEPENDENT_AMBULATORY_CARE_PROVIDER_SITE_OTHER): Payer: Self-pay | Admitting: Vascular Surgery

## 2017-11-30 DIAGNOSIS — I480 Paroxysmal atrial fibrillation: Secondary | ICD-10-CM

## 2017-11-30 DIAGNOSIS — I749 Embolism and thrombosis of unspecified artery: Secondary | ICD-10-CM | POA: Diagnosis not present

## 2017-11-30 DIAGNOSIS — F39 Unspecified mood [affective] disorder: Secondary | ICD-10-CM

## 2017-11-30 DIAGNOSIS — F0151 Vascular dementia with behavioral disturbance: Secondary | ICD-10-CM

## 2017-11-30 DIAGNOSIS — I5032 Chronic diastolic (congestive) heart failure: Secondary | ICD-10-CM

## 2017-11-30 DIAGNOSIS — J439 Emphysema, unspecified: Secondary | ICD-10-CM

## 2018-01-28 DIAGNOSIS — J449 Chronic obstructive pulmonary disease, unspecified: Secondary | ICD-10-CM

## 2018-01-28 DIAGNOSIS — F39 Unspecified mood [affective] disorder: Secondary | ICD-10-CM

## 2018-01-28 DIAGNOSIS — M1990 Unspecified osteoarthritis, unspecified site: Secondary | ICD-10-CM

## 2018-01-28 DIAGNOSIS — F0150 Vascular dementia without behavioral disturbance: Secondary | ICD-10-CM

## 2018-01-28 DIAGNOSIS — I739 Peripheral vascular disease, unspecified: Secondary | ICD-10-CM

## 2018-01-28 DIAGNOSIS — K219 Gastro-esophageal reflux disease without esophagitis: Secondary | ICD-10-CM

## 2018-01-28 DIAGNOSIS — I5030 Unspecified diastolic (congestive) heart failure: Secondary | ICD-10-CM

## 2018-01-28 DIAGNOSIS — I10 Essential (primary) hypertension: Secondary | ICD-10-CM

## 2018-01-28 DIAGNOSIS — I4891 Unspecified atrial fibrillation: Secondary | ICD-10-CM | POA: Diagnosis not present

## 2018-01-28 DIAGNOSIS — M1000 Idiopathic gout, unspecified site: Secondary | ICD-10-CM

## 2018-02-08 DIAGNOSIS — S30820A Blister (nonthermal) of lower back and pelvis, initial encounter: Secondary | ICD-10-CM | POA: Diagnosis not present

## 2018-02-14 ENCOUNTER — Encounter (INDEPENDENT_AMBULATORY_CARE_PROVIDER_SITE_OTHER): Payer: Self-pay

## 2018-02-14 ENCOUNTER — Ambulatory Visit (INDEPENDENT_AMBULATORY_CARE_PROVIDER_SITE_OTHER): Payer: Medicare Other | Admitting: Vascular Surgery

## 2018-02-14 ENCOUNTER — Other Ambulatory Visit (INDEPENDENT_AMBULATORY_CARE_PROVIDER_SITE_OTHER): Payer: Self-pay | Admitting: Vascular Surgery

## 2018-02-14 ENCOUNTER — Encounter (INDEPENDENT_AMBULATORY_CARE_PROVIDER_SITE_OTHER): Payer: Medicare Other

## 2018-02-14 DIAGNOSIS — I739 Peripheral vascular disease, unspecified: Secondary | ICD-10-CM

## 2018-03-26 ENCOUNTER — Emergency Department
Admission: EM | Admit: 2018-03-26 | Discharge: 2018-03-27 | Disposition: A | Discharge status: Skilled Nursing Facility | Payer: Medicare Other | Attending: Student in an Organized Health Care Education/Training Program | Admitting: Student in an Organized Health Care Education/Training Program

## 2018-03-26 DIAGNOSIS — E119 Type 2 diabetes mellitus without complications: Secondary | ICD-10-CM | POA: Diagnosis not present

## 2018-03-26 DIAGNOSIS — Y929 Unspecified place or not applicable: Secondary | ICD-10-CM | POA: Diagnosis not present

## 2018-03-26 DIAGNOSIS — Y939 Activity, unspecified: Secondary | ICD-10-CM | POA: Diagnosis not present

## 2018-03-26 DIAGNOSIS — S91115A Laceration without foreign body of left lesser toe(s) without damage to nail, initial encounter: Secondary | ICD-10-CM | POA: Diagnosis not present

## 2018-03-26 DIAGNOSIS — Y999 Unspecified external cause status: Secondary | ICD-10-CM | POA: Diagnosis not present

## 2018-03-26 DIAGNOSIS — Z7901 Long term (current) use of anticoagulants: Secondary | ICD-10-CM | POA: Insufficient documentation

## 2018-03-26 DIAGNOSIS — Z23 Encounter for immunization: Secondary | ICD-10-CM | POA: Insufficient documentation

## 2018-03-26 DIAGNOSIS — Z79899 Other long term (current) drug therapy: Secondary | ICD-10-CM | POA: Diagnosis not present

## 2018-03-26 DIAGNOSIS — S91114A Laceration without foreign body of right lesser toe(s) without damage to nail, initial encounter: Secondary | ICD-10-CM

## 2018-03-26 DIAGNOSIS — J449 Chronic obstructive pulmonary disease, unspecified: Secondary | ICD-10-CM | POA: Insufficient documentation

## 2018-03-26 DIAGNOSIS — X58XXXA Exposure to other specified factors, initial encounter: Secondary | ICD-10-CM | POA: Insufficient documentation

## 2018-03-26 DIAGNOSIS — Z87891 Personal history of nicotine dependence: Secondary | ICD-10-CM | POA: Diagnosis not present

## 2018-03-26 DIAGNOSIS — I5032 Chronic diastolic (congestive) heart failure: Secondary | ICD-10-CM | POA: Insufficient documentation

## 2018-03-26 DIAGNOSIS — I110 Hypertensive heart disease with heart failure: Secondary | ICD-10-CM | POA: Insufficient documentation

## 2018-03-26 MED ORDER — TETANUS-DIPHTH-ACELL PERTUSSIS 5-2.5-18.5 LF-MCG/0.5 IM SUSP
INTRAMUSCULAR | Status: AC
  Filled 2018-03-26: qty 0.5

## 2018-03-27 DIAGNOSIS — S91115A Laceration without foreign body of left lesser toe(s) without damage to nail, initial encounter: Secondary | ICD-10-CM | POA: Diagnosis not present

## 2018-03-27 MED ORDER — TETANUS-DIPHTH-ACELL PERTUSSIS 5-2.5-18.5 LF-MCG/0.5 IM SUSP
0.5000 mL | Freq: Once | INTRAMUSCULAR | Status: AC
  Administered 2018-03-27: 0.5 mL via INTRAMUSCULAR

## 2018-03-27 NOTE — ED Notes (Signed)
Attempted to call report to facility X 3. No answer. Discharge instructions sent with patient.

## 2018-03-27 NOTE — ED Notes (Signed)
Surgicel applied by MD. Bleeding controlled. Clean sterile dressing applied per MD order.

## 2018-03-27 NOTE — ED Provider Notes (Signed)
[facility] Emergency Department Provider Note    First MD Initiated Contact with Patient 03/27/18 0013     (approximate)  I have reviewed the triage vital signs and the nursing notes.   HISTORY  Chief Complaint Laceration    HPI [name] RegisterMargaret T Graber is a 84 y.o. female presents for evaluation of laceration to the bottom of the right fifth toe.  No details as to how injury occurred.  Wound was hemostatic upon arrival via EMS.  She is not on any blood thinners.  Past Medical History:  Diagnosis Date  . ANXIETY 11/21/2008  . Atrial fibrillation (HCC)   . CAD (coronary artery disease)    Mild by cath in remote past  . CHF (congestive heart failure) (HCC)   . COPD 01/06/2007  . DIVERTICULITIS OF COLON 07/23/2009  . Fistula   . GERD 01/06/2007  . HYPERLIPIDEMIA 01/06/2007  . HYPERTENSION 01/06/2007  . PAD (peripheral artery disease) (HCC)   . SLEEP APNEA 11/21/2008   Family History  Problem Relation Age of Onset  . Heart disease Father   . Asthma Father   . Cancer Mother   . Diabetes Daughter   . Colon cancer Son    Past Surgical History:  Procedure Laterality Date  . ABDOMINAL HYSTERECTOMY    . APPENDECTOMY    . CATARACT EXTRACTION    . CHOLECYSTECTOMY    . colovaginal fistula repair     after partial colectomy  . GALLBLADDER SURGERY    . LOWER EXTREMITY INTERVENTION N/A 09/23/2017   Procedure: LOWER EXTREMITY INTERVENTION;  Surgeon: Renford DillsSchnier, Gregory G, MD;  Location: [facility] INVASIVE CV LAB;  Service: Cardiovascular;  Laterality: N/A;  . PARTIAL COLECTOMY     diverticulitis  . TEMPORAL ARTERY BIOPSY / LIGATION    . TONSILLECTOMY  84 YEARS OLD  . TUBAL LIGATION     Patient Active Problem List   Diagnosis Date Noted  . Arterial occlusion 09/21/2017  . Hematochezia   . Rectal bleeding 04/06/2017  . Acute gout of left foot 01/21/2015  . Fever, unspecified 01/20/2015  . Hypokalemia 01/19/2015  . Colitis 01/11/2015  . Diastolic CHF (HCC)  09/15/2011  . Atrial fibrillation (HCC) 09/15/2011  . PAD (peripheral artery disease) (HCC) 07/15/2011  . Edema of both legs 07/15/2011  . Palpitations 07/15/2011  . Back pain 01/28/2011  . TIA (transient ischemic attack) 06/11/2010  . TOBACCO USE 07/23/2009  . DIVERTICULITIS OF COLON 07/23/2009  . ANXIETY 11/21/2008  . SLEEP APNEA 11/21/2008  . Coronary atherosclerosis 01/04/2008  . PERIPHERAL VASCULAR DISEASE 01/04/2008  . HIATAL HERNIA 01/04/2008  . PULMONARY EMBOLISM, HX OF 01/04/2008  . DIABETES MELLITUS, TYPE II 01/17/2007  . Polymyalgia rheumatica (HCC) 01/12/2007  . HYPERLIPIDEMIA 01/06/2007  . Essential hypertension 01/06/2007  . COPD 01/06/2007  . GERD 01/06/2007      Prior to Admission medications   Medication Sig Start Date End Date Taking? Authorizing Provider  acetaminophen (TYLENOL) 325 MG tablet Take 2 tablets (650 mg total) by mouth every 6 (six) hours as needed for mild pain (headache). 01/15/15   Enid BaasKalisetti, Radhika, MD  albuterol (ACCUNEB) 1.25 MG/3ML nebulizer solution Take 3 mLs by nebulization once as needed for wheezing.    [provider]  albuterol (PROVENTIL) (2.5 MG/3ML) 0.083% nebulizer solution Take 2.5 mg by nebulization every 6 (six) hours as needed for wheezing or shortness of breath.    [provider]  apixaban (ELIQUIS) 5 MG TABS tablet Take 1 tablet (5 mg total)  by mouth 2 (two) times daily. 09/24/17   Shaune Pollack, MD  carboxymethylcellulose (REFRESH PLUS) 0.5 % SOLN Place 1 drop into both eyes as needed.    [provider]  colchicine 0.6 MG tablet Take 0.3 mg by mouth daily.    [provider]  DIAPER RASH PRODUCTS EX Apply 1 application topically 3 (three) times daily as needed.    [provider]  diltiazem (CARDIZEM CD) 240 MG 24 hr capsule Take 240 mg by mouth daily.    [provider]  furosemide (LASIX) 20 MG tablet Take 20 mg by mouth daily as needed for edema.    [provider]  gabapentin (NEURONTIN) 100 MG capsule Take 100 mg by mouth 2 (two) times daily.     [provider]  lisinopril (PRINIVIL,ZESTRIL) 20 MG tablet Take 20 mg by mouth daily.    [provider]  loperamide (IMODIUM) 2 MG capsule Take 2-4 mg by mouth. Take 2 capsules (4mg ) by mouth at first sign of loose stool then take 1 capsule (2mg ) after each additional loose stool (max 2 doses daily)    [provider]  magnesium hydroxide (MILK OF MAGNESIA) 800 MG/5ML suspension Take 30 mLs by mouth 2 (two) times daily as needed for constipation.    [provider]  magnesium oxide (MAG-OX) 400 MG tablet Take 400 mg by mouth daily.    [provider]  Melatonin 5 MG TABS Take 1 tablet (5 mg total) by mouth at bedtime. 09/24/17   Shaune Pollack, MD  nystatin (MYCOSTATIN/NYSTOP) powder Apply 1 application topically 2 (two) times daily.    [provider]  omeprazole (PRILOSEC) 40 MG capsule Take 40 mg by mouth daily with breakfast.     [provider]  ondansetron (ZOFRAN) 4 MG tablet Take 1 tablet (4 mg total) by mouth every 6 (six) hours. As needed for nausea or vomiting Patient taking differently: Take 4 mg by mouth every 8 (eight) hours as needed for nausea or vomiting.  02/03/12   Bonk, John-Adam, MD  sennosides-docusate sodium (SENOKOT-S) 8.6-50 MG tablet Take 2 tablets by mouth daily.    [provider]  traMADol (ULTRAM) 50 MG tablet Take 50 mg by mouth every 8 (eight) hours as needed.    [provider]  traZODone (DESYREL) 50 MG tablet Take 50 mg by mouth at bedtime.    [provider]  vitamin B-12 (CYANOCOBALAMIN) 100 MCG tablet Take 1 tablet (100 mcg total) by mouth daily. 01/15/15   Enid Baas, MD  Vitamin D, Ergocalciferol, (DRISDOL) 50000 UNITS CAPS Take 50,000 Units by mouth every 30 (thirty) days.    [provider]    Allergies Hydroxyzine hcl    Social History Social History   Tobacco  Use  . Smoking status: Former Smoker    Types: Cigarettes  . Smokeless tobacco: Never Used  Substance Use Topics  . Alcohol use: No    Alcohol/week: 0.0 standard drinks  . Drug use: No    Review of Systems Patient denies headaches, rhinorrhea, blurry vision, numbness, shortness of breath, chest pain, edema, cough, abdominal pain, nausea, vomiting, diarrhea, dysuria, fevers, rashes or hallucinations unless otherwise stated above in HPI. ____________________________________________   PHYSICAL EXAM:  VITAL SIGNS: Vitals:   03/27/18 0004  BP: (!) 134/57  Pulse: 68  Temp: 97.8 F (36.6 C)  SpO2: 97%    Constitutional: Alert . Well appearing and in no acute distress. Eyes: Conjunctivae are normal.  Head:  Atraumatic. Nose: No congestion/rhinnorhea. Mouth/Throat: Mucous membranes are moist.   Neck: Painless ROM.  Cardiovascular:   Good peripheral circulation. Respiratory: Normal respiratory effort.  No retractions.  Gastrointestinal: Soft and nontender.  Musculoskeletal: No lower extremity tenderness .  There is small 1 cm laceration in the fold just below the right fifth toe.  No foreign bodies.  Small ooze noted.  No foreign bodies.  No surrounding erythema or deformity noted.  No tendon or ligament visible.  No joint effusions. Neurologic:  Normal speech and language. No gross focal neurologic deficits are appreciated.  Skin:  Skin is warm, dry and intact. No rash noted. Psychiatric: Mood and affect are normal. Speech and behavior are normal.  ____________________________________________   LABS (all labs ordered are listed, but only abnormal results are displayed)  No results found for this or any previous visit (from the past 24 hour(s)). ____________________________________________ ____________________________________________  RADIOLOGY   ____________________________________________   PROCEDURES  Procedure(s) performed:  Procedures    Critical Care  performed: no ____________________________________________   INITIAL IMPRESSION / ASSESSMENT AND PLAN / ED COURSE  Pertinent labs & imaging results that were available during my care of the patient were reviewed by me and considered in my medical decision making (see chart for details).  DDX: Laceration, contusion, abrasion, fracture.  [name] Cunningham is a 84 y.o. who presents to the ED with injury to right great toe as described above.  No evidence of other associated injury.  Hemostasis achieved.  For small not using a small piece of Surgicel was placed with gauze dressing.  The wound prior to placement of Surgicel was extensively cleansed with saline as well as Betadine.  No evidence of fracture or foreign body.  Tetanus updated.      ____________________________________________   FINAL CLINICAL IMPRESSION(S) / ED DIAGNOSES  Final diagnoses:  Laceration of lesser toe of right foot without foreign body present or damage to nail, initial encounter      NEW MEDICATIONS STARTED DURING THIS VISIT:  New Prescriptions   No medications on file     Note:  This document was prepared using Dragon voice recognition software and may include unintentional dictation errors.     Willy Eddyobinson, Alisi Lupien, MD 03/27/18 [phone]

## 2018-03-27 NOTE — Discharge Instructions (Signed)
Bandage should be changed daily and check to evaluate for redness, warmth or purulent drainage.  Please follow-up with PCP as well as podiatry.

## 2018-03-27 NOTE — ED Triage Notes (Signed)
Patient c/o laceration to underside of 5th digit, right foot. Bleeding controled at this time.   MD Roxan Hockeyobinson at bedside.

## 2018-03-29 DIAGNOSIS — I739 Peripheral vascular disease, unspecified: Secondary | ICD-10-CM | POA: Diagnosis not present

## 2018-03-29 DIAGNOSIS — I5032 Chronic diastolic (congestive) heart failure: Secondary | ICD-10-CM

## 2018-03-29 DIAGNOSIS — F0150 Vascular dementia without behavioral disturbance: Secondary | ICD-10-CM

## 2018-03-29 DIAGNOSIS — F39 Unspecified mood [affective] disorder: Secondary | ICD-10-CM

## 2018-03-29 DIAGNOSIS — I480 Paroxysmal atrial fibrillation: Secondary | ICD-10-CM | POA: Diagnosis not present

## 2018-06-01 DIAGNOSIS — F0150 Vascular dementia without behavioral disturbance: Secondary | ICD-10-CM

## 2018-06-01 DIAGNOSIS — I739 Peripheral vascular disease, unspecified: Secondary | ICD-10-CM

## 2018-06-01 DIAGNOSIS — I5030 Unspecified diastolic (congestive) heart failure: Secondary | ICD-10-CM

## 2018-06-01 DIAGNOSIS — J441 Chronic obstructive pulmonary disease with (acute) exacerbation: Secondary | ICD-10-CM

## 2018-06-01 DIAGNOSIS — F39 Unspecified mood [affective] disorder: Secondary | ICD-10-CM

## 2018-06-01 DIAGNOSIS — M1000 Idiopathic gout, unspecified site: Secondary | ICD-10-CM

## 2018-06-01 DIAGNOSIS — I4891 Unspecified atrial fibrillation: Secondary | ICD-10-CM | POA: Diagnosis not present

## 2018-06-01 DIAGNOSIS — M1990 Unspecified osteoarthritis, unspecified site: Secondary | ICD-10-CM

## 2018-06-01 DIAGNOSIS — I10 Essential (primary) hypertension: Secondary | ICD-10-CM

## 2018-06-01 DIAGNOSIS — K219 Gastro-esophageal reflux disease without esophagitis: Secondary | ICD-10-CM

## 2018-08-04 DIAGNOSIS — F0150 Vascular dementia without behavioral disturbance: Secondary | ICD-10-CM | POA: Diagnosis not present

## 2018-08-04 DIAGNOSIS — F39 Unspecified mood [affective] disorder: Secondary | ICD-10-CM | POA: Diagnosis not present

## 2018-08-04 DIAGNOSIS — J439 Emphysema, unspecified: Secondary | ICD-10-CM

## 2018-08-04 DIAGNOSIS — I480 Paroxysmal atrial fibrillation: Secondary | ICD-10-CM | POA: Diagnosis not present

## 2018-08-04 DIAGNOSIS — I739 Peripheral vascular disease, unspecified: Secondary | ICD-10-CM | POA: Diagnosis not present

## 2018-08-08 DIAGNOSIS — R509 Fever, unspecified: Secondary | ICD-10-CM | POA: Diagnosis not present

## 2018-08-09 DIAGNOSIS — L03116 Cellulitis of left lower limb: Secondary | ICD-10-CM | POA: Diagnosis not present

## 2018-08-15 DIAGNOSIS — L03116 Cellulitis of left lower limb: Secondary | ICD-10-CM | POA: Diagnosis not present

## 2018-08-19 DIAGNOSIS — L0390 Cellulitis, unspecified: Secondary | ICD-10-CM

## 2018-08-22 DIAGNOSIS — L03116 Cellulitis of left lower limb: Secondary | ICD-10-CM

## 2018-08-22 DIAGNOSIS — L89626 Pressure-induced deep tissue damage of left heel: Secondary | ICD-10-CM

## 2018-09-27 DIAGNOSIS — S51001D Unspecified open wound of right elbow, subsequent encounter: Secondary | ICD-10-CM | POA: Diagnosis not present

## 2018-09-28 DIAGNOSIS — I4891 Unspecified atrial fibrillation: Secondary | ICD-10-CM | POA: Diagnosis not present

## 2018-09-28 DIAGNOSIS — K219 Gastro-esophageal reflux disease without esophagitis: Secondary | ICD-10-CM

## 2018-09-28 DIAGNOSIS — I5030 Unspecified diastolic (congestive) heart failure: Secondary | ICD-10-CM

## 2018-09-28 DIAGNOSIS — F0150 Vascular dementia without behavioral disturbance: Secondary | ICD-10-CM | POA: Diagnosis not present

## 2018-09-28 DIAGNOSIS — I10 Essential (primary) hypertension: Secondary | ICD-10-CM

## 2018-09-28 DIAGNOSIS — F330 Major depressive disorder, recurrent, mild: Secondary | ICD-10-CM | POA: Diagnosis not present

## 2018-09-28 DIAGNOSIS — I739 Peripheral vascular disease, unspecified: Secondary | ICD-10-CM

## 2018-09-28 DIAGNOSIS — M1990 Unspecified osteoarthritis, unspecified site: Secondary | ICD-10-CM

## 2018-09-28 DIAGNOSIS — J449 Chronic obstructive pulmonary disease, unspecified: Secondary | ICD-10-CM

## 2018-09-28 DIAGNOSIS — M1000 Idiopathic gout, unspecified site: Secondary | ICD-10-CM

## 2018-11-02 DIAGNOSIS — R070 Pain in throat: Secondary | ICD-10-CM | POA: Diagnosis not present

## 2018-11-02 DIAGNOSIS — R0982 Postnasal drip: Secondary | ICD-10-CM | POA: Diagnosis not present

## 2018-11-23 DIAGNOSIS — R05 Cough: Secondary | ICD-10-CM

## 2018-12-01 DIAGNOSIS — I739 Peripheral vascular disease, unspecified: Secondary | ICD-10-CM

## 2018-12-01 DIAGNOSIS — F0150 Vascular dementia without behavioral disturbance: Secondary | ICD-10-CM

## 2018-12-01 DIAGNOSIS — I480 Paroxysmal atrial fibrillation: Secondary | ICD-10-CM

## 2018-12-01 DIAGNOSIS — I5032 Chronic diastolic (congestive) heart failure: Secondary | ICD-10-CM

## 2018-12-01 DIAGNOSIS — F39 Unspecified mood [affective] disorder: Secondary | ICD-10-CM

## 2018-12-01 DIAGNOSIS — J439 Emphysema, unspecified: Secondary | ICD-10-CM | POA: Diagnosis not present

## 2018-12-14 DIAGNOSIS — H60549 Acute eczematoid otitis externa, unspecified ear: Secondary | ICD-10-CM | POA: Diagnosis not present

## 2018-12-23 DIAGNOSIS — J449 Chronic obstructive pulmonary disease, unspecified: Secondary | ICD-10-CM | POA: Diagnosis not present

## 2019-01-03 DIAGNOSIS — R21 Rash and other nonspecific skin eruption: Secondary | ICD-10-CM | POA: Diagnosis not present

## 2019-01-13 DIAGNOSIS — J309 Allergic rhinitis, unspecified: Secondary | ICD-10-CM

## 2019-01-13 DIAGNOSIS — R490 Dysphonia: Secondary | ICD-10-CM | POA: Diagnosis not present

## 2019-01-13 DIAGNOSIS — K219 Gastro-esophageal reflux disease without esophagitis: Secondary | ICD-10-CM | POA: Diagnosis not present

## 2019-02-01 DIAGNOSIS — J449 Chronic obstructive pulmonary disease, unspecified: Secondary | ICD-10-CM

## 2019-02-01 DIAGNOSIS — I10 Essential (primary) hypertension: Secondary | ICD-10-CM

## 2019-02-01 DIAGNOSIS — I788 Other diseases of capillaries: Secondary | ICD-10-CM

## 2019-02-01 DIAGNOSIS — K219 Gastro-esophageal reflux disease without esophagitis: Secondary | ICD-10-CM

## 2019-02-01 DIAGNOSIS — F0150 Vascular dementia without behavioral disturbance: Secondary | ICD-10-CM | POA: Diagnosis not present

## 2019-02-01 DIAGNOSIS — F39 Unspecified mood [affective] disorder: Secondary | ICD-10-CM

## 2019-02-01 DIAGNOSIS — M1990 Unspecified osteoarthritis, unspecified site: Secondary | ICD-10-CM

## 2019-02-01 DIAGNOSIS — I4891 Unspecified atrial fibrillation: Secondary | ICD-10-CM

## 2019-02-01 DIAGNOSIS — I5030 Unspecified diastolic (congestive) heart failure: Secondary | ICD-10-CM

## 2019-02-01 DIAGNOSIS — M1000 Idiopathic gout, unspecified site: Secondary | ICD-10-CM

## 2019-02-13 DIAGNOSIS — R112 Nausea with vomiting, unspecified: Secondary | ICD-10-CM | POA: Diagnosis not present

## 2019-02-24 DIAGNOSIS — R4181 Age-related cognitive decline: Secondary | ICD-10-CM

## 2019-03-07 DEATH — deceased
